# Patient Record
Sex: Female | Born: 1960 | Race: White | Hispanic: No | Marital: Married | Smoking: Never smoker
Health system: Southern US, Community
[De-identification: ages and names within clinical notes are randomized; demographics above are authoritative.]

## PROBLEM LIST (undated history)

## (undated) DIAGNOSIS — I10 Essential (primary) hypertension: Secondary | ICD-10-CM

## (undated) DIAGNOSIS — E781 Pure hyperglyceridemia: Secondary | ICD-10-CM

## (undated) DIAGNOSIS — K5900 Constipation, unspecified: Secondary | ICD-10-CM

## (undated) DIAGNOSIS — F419 Anxiety disorder, unspecified: Secondary | ICD-10-CM

## (undated) DIAGNOSIS — G35D Multiple sclerosis, unspecified: Secondary | ICD-10-CM

## (undated) DIAGNOSIS — R7303 Prediabetes: Secondary | ICD-10-CM

## (undated) DIAGNOSIS — R112 Nausea with vomiting, unspecified: Secondary | ICD-10-CM

## (undated) DIAGNOSIS — G2581 Restless legs syndrome: Secondary | ICD-10-CM

## (undated) DIAGNOSIS — G709 Myoneural disorder, unspecified: Secondary | ICD-10-CM

## (undated) DIAGNOSIS — T4145XA Adverse effect of unspecified anesthetic, initial encounter: Secondary | ICD-10-CM

## (undated) DIAGNOSIS — D649 Anemia, unspecified: Secondary | ICD-10-CM

## (undated) DIAGNOSIS — G4730 Sleep apnea, unspecified: Secondary | ICD-10-CM

## (undated) DIAGNOSIS — Z973 Presence of spectacles and contact lenses: Secondary | ICD-10-CM

## (undated) DIAGNOSIS — K219 Gastro-esophageal reflux disease without esophagitis: Secondary | ICD-10-CM

## (undated) DIAGNOSIS — Z9889 Other specified postprocedural states: Secondary | ICD-10-CM

## (undated) DIAGNOSIS — T8859XA Other complications of anesthesia, initial encounter: Secondary | ICD-10-CM

## (undated) DIAGNOSIS — M1990 Unspecified osteoarthritis, unspecified site: Secondary | ICD-10-CM

## (undated) DIAGNOSIS — F329 Major depressive disorder, single episode, unspecified: Secondary | ICD-10-CM

## (undated) DIAGNOSIS — F32A Depression, unspecified: Secondary | ICD-10-CM

## (undated) DIAGNOSIS — G35 Multiple sclerosis: Secondary | ICD-10-CM

## (undated) DIAGNOSIS — R51 Headache: Secondary | ICD-10-CM

## (undated) HISTORY — PX: TENDON RELEASE: SHX230

## (undated) HISTORY — PX: SHOULDER ARTHROSCOPY: SHX128

## (undated) HISTORY — PX: MEDIAL PARTIAL KNEE REPLACEMENT: SHX5965

## (undated) HISTORY — PX: OTHER SURGICAL HISTORY: SHX169

## (undated) HISTORY — PX: KNEE ARTHROSCOPY: SUR90

## (undated) HISTORY — PX: CARPAL TUNNEL RELEASE: SHX101

## (undated) HISTORY — PX: BLADDER SUSPENSION: SHX72

---

## 2006-07-07 HISTORY — PX: TOTAL KNEE ARTHROPLASTY: SHX125

## 2007-03-30 ENCOUNTER — Inpatient Hospital Stay (HOSPITAL_COMMUNITY): Admission: RE | Admit: 2007-03-30 | Discharge: 2007-03-31 | Payer: Self-pay | Admitting: Orthopedic Surgery

## 2008-07-07 HISTORY — PX: ENDOMETRIAL ABLATION: SHX621

## 2010-07-02 ENCOUNTER — Encounter
Admission: RE | Admit: 2010-07-02 | Discharge: 2010-07-02 | Payer: Self-pay | Source: Home / Self Care | Attending: Specialist | Admitting: Specialist

## 2010-07-07 HISTORY — PX: JOINT REPLACEMENT: SHX530

## 2010-07-17 ENCOUNTER — Encounter
Admission: RE | Admit: 2010-07-17 | Discharge: 2010-07-17 | Payer: Self-pay | Source: Home / Self Care | Attending: Orthopedic Surgery | Admitting: Orthopedic Surgery

## 2010-11-19 NOTE — Op Note (Signed)
NAMELATESA, FRATTO              ACCOUNT NO.:  [id number]   MEDICAL RECORD NO.:  [id number]          PATIENT TYPE:  INP   LOCATION:  X008                         FACILITY:  [facility]   PHYSICIAN:  Madlyn Frankel. Charlann Boxer, M.D.  DATE OF BIRTH:  March 29, 1961   DATE OF PROCEDURE:  03/30/2007  DATE OF DISCHARGE:                               OPERATIVE REPORT   PREOPERATIVE DIAGNOSIS:  Right knee medial compartment osteoarthritis.   POSTOPERATIVE DIAGNOSIS:  Right knee medial compartment osteoarthritis.   PROCEDURE:  Right knee partial knee replacement.   COMPONENTS USED:  Biomet Oxford knee system size medium femur, right  medial size C tibial tray and a size 3 insert.   SURGEON:  Madlyn Frankel. Charlann Boxer, M.D.   ASSISTANT:  Dwyane Luo, PA-C   ANESTHESIA:  Duramorph spinal.   COMPLICATIONS:  None.   BLOOD LOSS:  Minimal 50 mL.   DRAINS:  None.   INDICATIONS FOR PROCEDURE:  Joan Ray is a 50 year old female who  presented office on referral for evaluation for partial knee replacement  after failing conservative measures of knee arthroscopy, injections and  viscus augmentation.  She had persistent isolated medial knee pain per  her complaints and examination findings.  Radiographs indicate preserved  medial and anterior cruciate ligaments.  After reviewing her options  available at 50 years of age including partial versus total knee  replacement, risks and benefits of both, consent was obtained for  partial knee replacement.   PROCEDURE IN DETAIL:  The patient was brought to operative theater.  Once adequate anesthesia preoperative antibiotics administered,  clindamycin, the patient was positioned supine.  Proximal thigh  tourniquet was placed the right leg was then flexed and abducted onto  the Oxford leg holder, allowing for flexion to 120 degrees.  Right lower  extremity pre scrubbed and then prepped and draped using a soap  solution.   The leg was exsanguinated, tourniquet elevated.   The  midline incision was made from the patella to the tubercle.  Sharp  dissection was carried down the extensor mechanism.  Modified medial  patellar arthrotomy was created.  Following initial exposure and  debridement of some the anterior fat pad in the anterior meniscus small  exposure medially was carried out.  Initial attention was directed to  the tibial cut.   With an extramedullary guide positioned, I resected this point portion  of the proximal medial tibia.  We trialed with a size B tibial tray  initially and felt that at first the 4 passed pretty easily but the 3  was better.  Went ahead and chose this as my option for the tibial cut.  A starting awl was then placed into the femur 1 cm medial to the notch  of the femur.  Intramedullary rod was then passed as the guide.   At this point with the tibial tray in place and the 3 shim, the  posterior femoral cutting block was placed.  Holes were drilled for  this.  The posterior cutting block was then placed and the posterior cut  made.   Following this the zero spigot was  placed and the proximal and distal  femur milled.  After removing excessive bone trial reduction was carried  out and felt that in flexion the 86felt better than the 4 and with the  knee at 20 degrees of flexion I was unable to even a 1 spacer in.  Based  on experience this point I chose to use a 4 spigot. I went ahead and  milled the distal femur, removed excessive bone, trial reduction again  carried out with this medium femur and the size 3 insert.  The knee came  out to full extension.  There was a bit of play in the poly insert,  indicating that the knee was not excessively tight.  At this point all  the trials were removed.  Attention was now directed tibia.  After I  reevaluated tibia, I felt that the size C tray fit best and was held  into position against the cut surface of the proximal tibia, pinned in  position.  This allowed me use a reciprocating saw to  create a trough.  This was debrided out and trial reduction carried out with a size C  tibia for the right medial tibia, size medium femur.  The size three  trial insert that was lipped was inserted into the knee.  At 90 degrees  of flexion there was a couple mm play and the same at 20 degrees.  I  then determined I needed to remove some of this bone about 5 mm anterior  to the tip of the prosthesis.  This prevented impingement.  The knee  came out to full extension.  The knee did come out to full extension  without complication features.   At this point all trials were removed.  I used the pinpoint drill to  drill holes through sclerotic bone.   Following this I irrigated the knee out, suctioned it dry.  Cement was  mixed in two batches.  After the first mixture, this tibia was then  placed, a trial femur placed in the 3 feeler gauge placed with the knee  in flexion in 45 degrees, approximately 8-9 minutes this was removed.  Excessive cement removed as much could be visualized.  The same  procedure happened for the femoral component.   Following the cement technique, I removed as much visualized cement as I  could see, irrigated knee out again.  Based on the trials, I chose a  size 3 insert.  The size 3 insert was then snapped into position without  difficulty or complication.   At this point the knee was reirrigated.  I injected the capsular and  retinacular tissues with 30 mL of 0.25% Marcaine and 1 mL Toradol.  I  then reapproximated extensor mechanism using #1 Vicryl, 2-0 Vicryl in  the subcu tissue and a running 4-0 Monocryl.  The knee was cleaned,  dried, dressed sterilely with Steri-Strips and sterile bulky dressing.  She was brought recovery room in stable condition.      Madlyn Frankel Charlann Boxer, M.D.  Electronically Signed     MDO/MEDQ  D:  03/30/2007  T:  03/31/2007  Job:  [id number]

## 2010-11-22 NOTE — H&P (Signed)
Joan Ray, Joan Ray              ACCOUNT NO.:  [id number]   MEDICAL RECORD NO.:  [id number]         PATIENT TYPE:  LINP   LOCATION:                               FACILITY:  [facility]   PHYSICIAN:  Madlyn Frankel. Charlann Boxer, M.D.  DATE OF BIRTH:  Aug 16, 1960   DATE OF ADMISSION:  03/30/2007  DATE OF DISCHARGE:                              HISTORY & PHYSICAL   ATTENDING PHYSICIAN:  Durene Romans, M.D.   PROCEDURE TO BE PERFORMED:  The procedure will be a right partial knee  replacement.   CHIEF COMPLAINT:  Right knee pain.   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  An MRI scan has  been done in the past and has shown no meniscal pathology.  Her knee has  been refractory to all conservative treatment including anti-  inflammatories, cortisone injections and viscus supplementation.  She  also has experienced a long-standing history of left plantar fasciitis  which she is being treated for concomitantly.  She has been  presurgically assessed for surgery by York Ram, P.A. and Dr.  Janene Harvey.   PAST MEDICAL HISTORY:  The past medical history is significant for:  1. Osteoarthritis.  2. Left heel pain, plantar fasciitis.  3. Reflux disease.   PAST SURGICAL HISTORY:  The past surgical history includes a left knee  lateral release in 07/07, right knee arthroscopic debridement.   FAMILY HISTORY:  The family history is significant for heart disease,  hypertension, stroke, and arthritis.   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  She is married.  The primary caregiver after surgery will be her husband  and her daughter.   ALLERGIES:  Drug allergies include codeine, Betadine, EDTA, hydrocodone,  Tylox, Keflex, Cefzil, erythromycin,  Darvocet, and Valium.   MEDICATIONS:  Medications include Nexium 40 mg p.o. q.d., Effexor 75 mg  p.o. q.d., Zyrtec 10 mg p.o. q.d., Ambien-CR 12.5 mg one p.o. q.h.s.,  Celebrex 200 mg p.o. q.d., Norvasc 5 mg p.o. q.d., Klonopin 1 mg p.o.  q.d., and Demerol 50 mg p.r.n.   REVIEW OF SYSTEMS:  MUSCULOSKELETAL:  Long-standing history of left heel  plantar fasciitis for which she will need additional physical therapy at  the time of surgery as well otherwise see HPI.   PHYSICAL EXAMINATION:  VITAL SIGNS:  Pulse 69, respirations 18, and  blood pressure 118/80.  GENERAL:  Awake, alert and oriented, well-developed, well-nourished, no  acute distress.  NECK:  Supple.  No carotid bruits.  CHEST/LUNGS:  Clear to auscultation bilaterally.  BREASTS:  Deferred.  HEART:  Regular rate and rhythm without murmurs.  ABDOMEN:  Soft, nontender, bowel sounds present.  GENITOURINARY:  Deferred.  EXTREMITIES:  Right knee has medial sided tenderness.  She does have  mild varus.  Her range of motion is 3 to 130.  SKIN:  No cellulitis.  NEUROLOGIC:  Intact distal  sensibilities.   INVESTIGATIONS:  Laboratory studies, EKG, and chest x-ray pending, to be  faxed to office, and also presurgical testing.   IMPRESSION:  1. Right knee medial osteoarthritis.  2. Reflux disease.  3. Migraines.  4. Menopause.   PLAN OF ACTION:  Right partial knee replacement by surgeon Dr. Durene Romans. The risks and complications were discussed.  Questions were  encouraged, answered, reviewed.   During her course of stay she will need additional physical therapy for  plantar fasciitis, especially during her home health care physical  therapy.  I had a previous conversation with home health worker who  stated that provided she is covered they would be able to work with the  patient for this plantar fasciitis as it will be necessary due to her  gait retraining.     ______________________________  Joan Ray,  Joan Ray      Madlyn Frankel. Charlann Boxer, M.D.  Electronically Signed    BLM/MEDQ  D:  03/15/2007  T:  03/16/2007  Job:  [id number]   cc:   Joan Ray, Joan Ray   Madlyn Frankel. Charlann Boxer, M.D.  Fax: [phone]

## 2010-12-03 ENCOUNTER — Other Ambulatory Visit: Payer: Self-pay | Admitting: Orthopedic Surgery

## 2010-12-03 ENCOUNTER — Encounter (HOSPITAL_COMMUNITY): Payer: BC Managed Care – PPO

## 2010-12-03 LAB — DIFFERENTIAL
Basophils Absolute: 0.1 10*3/uL (ref 0.0–0.1)
Basophils Relative: 1 % (ref 0–1)
Eosinophils Absolute: 0.3 10*3/uL (ref 0.0–0.7)
Eosinophils Relative: 3 % (ref 0–5)
Lymphocytes Relative: 43 % (ref 12–46)
Lymphs Abs: 3.7 10*3/uL (ref 0.7–4.0)
Monocytes Absolute: 0.6 10*3/uL (ref 0.1–1.0)
Monocytes Relative: 6 % (ref 3–12)
Neutro Abs: 4 10*3/uL (ref 1.7–7.7)
Neutrophils Relative %: 47 % (ref 43–77)

## 2010-12-03 LAB — CBC
HCT: 40.5 % (ref 36.0–46.0)
Hemoglobin: 13.4 g/dL (ref 12.0–15.0)
MCH: 29.1 pg (ref 26.0–34.0)
MCHC: 33.1 g/dL (ref 30.0–36.0)
MCV: 88 fL (ref 78.0–100.0)
Platelets: 279 10*3/uL (ref 150–400)
RBC: 4.6 MIL/uL (ref 3.87–5.11)
RDW: 14.6 % (ref 11.5–15.5)
WBC: 8.6 10*3/uL (ref 4.0–10.5)

## 2010-12-03 LAB — URINALYSIS, ROUTINE W REFLEX MICROSCOPIC
Bilirubin Urine: NEGATIVE
Hgb urine dipstick: NEGATIVE
Ketones, ur: NEGATIVE mg/dL
Protein, ur: NEGATIVE mg/dL
Urobilinogen, UA: 0.2 mg/dL (ref 0.0–1.0)

## 2010-12-03 LAB — BASIC METABOLIC PANEL
Calcium: 9.1 mg/dL (ref 8.4–10.5)
Chloride: 96 mEq/L (ref 96–112)
Creatinine, Ser: 0.56 mg/dL (ref 0.4–1.2)
GFR calc Af Amer: >60 mL/min (ref >60)
Glucose, Bld: 104 mg/dL — ABNORMAL HIGH (ref 70–99)

## 2010-12-03 LAB — PROTIME-INR
INR: 0.91 (ref 0.00–1.49)
Prothrombin Time: 12.5 seconds (ref 11.6–15.2)

## 2010-12-03 LAB — SURGICAL PCR SCREEN: Staphylococcus aureus: NEGATIVE

## 2010-12-03 LAB — APTT: aPTT: 28 seconds (ref 24–37)

## 2010-12-09 ENCOUNTER — Inpatient Hospital Stay (HOSPITAL_COMMUNITY)
Admission: RE | Admit: 2010-12-09 | Discharge: 2010-12-12 | DRG: 209 | Disposition: A | Discharge status: Home Health | Payer: BC Managed Care – PPO | Source: Ambulatory Visit | Attending: Orthopedic Surgery | Admitting: Orthopedic Surgery

## 2010-12-09 DIAGNOSIS — M1710 Unilateral primary osteoarthritis, unspecified knee: Principal | ICD-10-CM | POA: Diagnosis present

## 2010-12-09 DIAGNOSIS — Z96659 Presence of unspecified artificial knee joint: Secondary | ICD-10-CM

## 2010-12-09 DIAGNOSIS — I10 Essential (primary) hypertension: Secondary | ICD-10-CM | POA: Diagnosis present

## 2010-12-09 DIAGNOSIS — Z79899 Other long term (current) drug therapy: Secondary | ICD-10-CM

## 2010-12-09 DIAGNOSIS — F341 Dysthymic disorder: Secondary | ICD-10-CM | POA: Diagnosis present

## 2010-12-09 DIAGNOSIS — K219 Gastro-esophageal reflux disease without esophagitis: Secondary | ICD-10-CM | POA: Diagnosis present

## 2010-12-09 DIAGNOSIS — Z881 Allergy status to other antibiotic agents status: Secondary | ICD-10-CM

## 2010-12-09 DIAGNOSIS — N390 Urinary tract infection, site not specified: Secondary | ICD-10-CM | POA: Diagnosis not present

## 2010-12-09 DIAGNOSIS — G35 Multiple sclerosis: Secondary | ICD-10-CM | POA: Diagnosis present

## 2010-12-09 DIAGNOSIS — Z885 Allergy status to narcotic agent status: Secondary | ICD-10-CM

## 2010-12-09 DIAGNOSIS — J383 Other diseases of vocal cords: Secondary | ICD-10-CM | POA: Diagnosis present

## 2010-12-09 DIAGNOSIS — G8929 Other chronic pain: Secondary | ICD-10-CM | POA: Diagnosis present

## 2010-12-09 LAB — TYPE AND SCREEN: Antibody Screen: NEGATIVE

## 2010-12-10 LAB — BASIC METABOLIC PANEL
BUN: 9 mg/dL (ref 6–23)
Calcium: 8.1 mg/dL — ABNORMAL LOW (ref 8.4–10.5)
Chloride: 100 mEq/L (ref 96–112)
Creatinine, Ser: 0.48 mg/dL (ref 0.4–1.2)
Sodium: 138 mEq/L (ref 135–145)

## 2010-12-10 LAB — CBC
Hemoglobin: 10.7 g/dL — ABNORMAL LOW (ref 12.0–15.0)
MCH: 29.8 pg (ref 26.0–34.0)
RDW: 15 % (ref 11.5–15.5)
WBC: 9.1 10*3/uL (ref 4.0–10.5)

## 2010-12-11 LAB — BASIC METABOLIC PANEL
BUN: 5 mg/dL — ABNORMAL LOW (ref 6–23)
CO2: 29 mEq/L (ref 19–32)
Creatinine, Ser: 0.56 mg/dL (ref 0.4–1.2)
GFR calc non Af Amer: >60 mL/min (ref >60)
Potassium: 2.8 mEq/L — ABNORMAL LOW (ref 3.5–5.1)
Sodium: 136 mEq/L (ref 135–145)

## 2010-12-11 LAB — CBC
Hemoglobin: 11.5 g/dL — ABNORMAL LOW (ref 12.0–15.0)
MCV: 89.3 fL (ref 78.0–100.0)
RDW: 14.9 % (ref 11.5–15.5)

## 2010-12-12 LAB — URINALYSIS, ROUTINE W REFLEX MICROSCOPIC
Bilirubin Urine: NEGATIVE
Specific Gravity, Urine: 1.019 (ref 1.005–1.030)

## 2010-12-12 LAB — URINE MICROSCOPIC-ADD ON

## 2010-12-14 LAB — URINE CULTURE
Colony Count: >=100000
Culture  Setup Time: 201206071418

## 2010-12-16 NOTE — Op Note (Signed)
NAMEFLORICE, Ray NO.:  [id number]  MEDICAL RECORD NO.:  [id number]  LOCATION:  0008                         FACILITY:  [facility]  PHYSICIAN:  Madlyn Frankel. Charlann Boxer, M.D.  DATE OF BIRTH:  [birth year]  DATE OF PROCEDURE:  12/09/2010 DATE OF DISCHARGE:                              OPERATIVE REPORT   PREOPERATIVE DIAGNOSIS:  Left knee osteoarthritis.  POSTOPERATIVE DIAGNOSIS:  Left knee osteoarthritis.  PROCEDURE:  Left total-knee replacement.  COMPONENTS USED:  DePuy rotating platform posterior stabilized knee system with size 4 narrow femur, 3 tibia, 10-mm insert and a 41 patellar button.  SURGEON:  Madlyn Frankel. Charlann Boxer, M.D.  ASSISTANT:  Jaquelyn Bitter. Chabon, PA-C.  ANESTHESIA:  Preoperative regional femoral nerve block plus general anesthetic.  SPECIMENS:  None.  COMPLICATIONS:  None.  DRAINS:  One Hemovac.  TOURNIQUET TIME:  Tourniquet time was 37 minutes at 250 mmHg.  INDICATIONS FOR PROCEDURE:  Ms. Joan Ray is a 50 year old female who has been followed in the office for bilateral knee pain.  She has had a successful right partial knee replacement, but has presented recently with left knee issues.  She had failed conservative measures including arthroscopic debridement with persistent and progressive degenerative changes as well as some edematous changes of the bone.  After unsuccessfully trying to manage her left knee pain, she wished to proceed with more definitive measures.  Based on arthroscopic findings of some degenerative changes within the patellofemoral compartment, I suggested that she should perhaps entertain a total-knee replacement versus the partial in this left knee.  The risks and benefits and pros and cons of the procedure were discussed in the setting of her previous surgery.  Consent was obtained for the benefit of pain relief.  PROCEDURE IN DETAIL:  The patient was brought to the operative theater. Once adequate anesthesia, preoperative  antibiotics, and clindamycin administered, she was positioned supine with a left thigh tourniquet placed.  The left lower extremity was then prepped and draped in the sterile fashion.  A time-out was performed identifying the patient, planned procedure and extremity.  Her left foot was placed in the [facility] leg holder.  The leg was exsanguinated and the tourniquet elevated to 250 mmHg.  A midline incision was made followed by a median parapatellar arthrotomy following initial exposure and debridement.  Attention was first directed to the patella.  Precut measurement was approximately 24 mm.  I resected down to about 14 mm and used the 41 patellar button to cover the cut surface.  Lug holes were drilled and a metal shim placed to protect the patella from retractors and saw blades.  Attention was now directed to the femur.  The femoral canal was opened with a drill and irrigated to try to prevent fat emboli.  An intramedullary rod was passed in at 5 degrees of valgus and 10 mm of bone was resected off the distal femur.  Following this resection, the tibia was subluxated anteriorly and using an extramedullary guide, a 10- mm resection was made based off the proximal lateral tibia.  We then confirmed that the extension gap was stable medially and laterally with a 10-mm block in place.  I then sized the femur  to be a size 4.  I felt that medially and laterally it was going to be best fit with a narrow component.  After confirming that the tibial cut was perpendicular in the coronal plane, I set rotation of the size 4 rotation block based on the proximal tibia that was anterior in reference.  The 4-in-1 cutting block was placed.  The anterior, posterior and chamfer cuts were then made without difficulty nor notching.  The final box cut was made off the lateral aspect of the distal femur.  The tibia was then subluxated anteriorly and the cut surface seemed to be best fit with a size 3  tibial tray.  It was pinned to the medial third of the tubercle, drilled and keel punched.  A trial reduction was carried out with the 4 narrow femur, 3 tibia, and a 10-mm insert.  The knee came to full extension and was stable from extension to flexion. The patella tracked through the trochlea without the application of pressure.  Given these findings, the trial components were removed.  The final components were opened and the knee was irrigated with normal saline solution with pulse lavage and 30 cc of 0.25% Marcaine was injected into the synovial capsule junction of the knee.  The final components were then cemented onto the clean and dried cut surfaces of bone.  The knee was brought to extension with a 10-mm insert and extruded cement was removed.  Once the cement had fully cured and excessive cement was removed throughout the knee, the final 10-mm insert was chosen and placed in the knee.  The knee was re-irrigated.  The tourniquet had been let down at 37 minutes.  No significant hemostasis was required.  A medium Hemovac drain was placed deep.  The extensor mechanism was then reapproximated using #1 Vicryl with the knee in flexion.  The remainder of the wound was closed with 2-0 Vicryl and running 4-0 Monocryl.  The knee was cleaned, dried and dressed sterilely with Dermabond and an Aquacel dressing.  The drain site was dressed separately and the knee was wrapped in an Ace wrap.  She was brought to the recovery room, extubated, stable, tolerating the procedure well.     Madlyn Frankel Charlann Boxer, M.D.     MDO/MEDQ  D:  12/09/2010  T:  12/09/2010  Job:  [id number]  Electronically Signed by Durene Romans M.D. on 12/16/2010 09:21:05 AM

## 2010-12-16 NOTE — Discharge Summary (Signed)
NAMEXOCHILTH, STANDISH NO.:  [id number]  MEDICAL RECORD NO.:  [id number]  LOCATION:  1612                         FACILITY:  [facility]  PHYSICIAN:  Madlyn Frankel. Charlann Boxer, M.D.  DATE OF BIRTH:  26-May-1961  DATE OF ADMISSION:  12/09/2010 DATE OF DISCHARGE:  12/12/2010                              DISCHARGE SUMMARY   ADMITTING DIAGNOSIS:  Left knee osteoarthritis.  DISCHARGE DIAGNOSES: 1. Left knee osteoarthritis, status post left total knee replacement. 2. Urinary tract infection 3. Anxiety, depression. 4. Hypertension. 5. Multiple sclerosis. 6. Reflux disease. 7. Chronic pain issues. 8. History of right partial knee replacement.  ADMITTING HISTORY:  Ms. Joan Ray is a pleasant 50 year old female who I have been following in the office.  She has had a successful right partial knee replacement.  She has had recent-onset left knee pain with a history of knee arthroscopy with progression of her condition.  She had persistent recurring left knee discomfort with some findings radiographically and arthroscopically the patellofemoral changes.  Given the persistence of her problems, a repeat evaluation by MRI and the findings above led to the discussion of more definitive measures, she would like at this point to proceed with more definitive measures, total knee replacement versus partial knee wounds were discussed.  Consent was obtained.  HOSPITAL COURSE:  The patient was admitted for same-day surgery on December 09, 2010, who underwent a left total knee replacement.  Postoperatively, after routine stay in the recovery room, she was transferred to the orthopedic ward where she remained for a 3-day hospital stay.  Ms. Joan Ray had multiple drug allergies, sensitivities, which made it difficult to manage her discomfort as well without other issues.  Her nausea and vomiting were significant for that time, in fact the day of the first surgery.  She was, however, placed on Benadryl  for itching, placed on Dilaudid for pain.  We had kept her Hemovac drain until postop day #2 to help pull out some excessive postoperative bleeding, she was placed on aspirin postop.  Her hematocrit on postop day #1 was 32.2.  By postop day #2, she was stabilized.  On postop day #3, she was at this point ready for discharge.  She did report some frequency and urgency with urination.  Urinalysis was ordered and found to be positive for UA, she was started on ciprofloxacin 500 mg p.o. b.i.d. for a total of 4 days.  DISCHARGE INSTRUCTIONS:  She will be discharged home with home health physical therapy to work on range of motion, strengthening, gait training.  She will return to see Dr. Durene Romans at [facility] at [phone] in 2 weeks.  Any questions can be addressed to office.  She may shower and put an Aquacel dressing, which can be removed on Tuesday or Wednesday the following week.  She will then cover with gauze and tape.  Her discharge medications will include all of her home medications including, 1. Ciprofloxacin 500 mg p.o. b.i.d. for urinary tract infection. 2. Benadryl 50 mg q.6 h. as needed for itching. 3. Colace 100 mg p.o. b.i.d. for constipation while on pain medicine. 4. Aspirin 325 mg b.i.d. while on pain medicine. 5. Dilaudid 4 mg tablets  1-2 tablets every 4-6 as needed for pain. 6. Robaxin 500 mg every 6 hours as needed for muscle spasm pain. 7. Ambien CR 12.5 mg nightly. 8. Bisacodyl 5 mg daily as needed. 9. Celebrex 200 mg b.i.d. as needed. 10.Cymbalta 60 mg 2 capsules daily. 11.Flexeril 10 mg t.i.d. as needed, not to be used at the same time     with Robaxin. 12.Hydrochlorothiazide 25 mg daily. 13.Klonopin 1 mg t.i.d. as needed. 14.Nexium 40 mg daily. 15.Norvasc 10 mg daily. 16.Phenergan 25 mg 1/4 to 1/2 tablet every 6 hours as needed for     nausea. 17.Relpax 40 mg daily. 18.Robaxin 500 mg. 19.Tysabri IV medications for multiple sclerosis  taking monthly at 300     mg. 20.Zyrtec 10 mg daily as needed.     Madlyn Frankel Charlann Boxer, M.D.     MDO/MEDQ  D:  12/12/2010  T:  12/13/2010  Job:  [id number]  Electronically Signed by Durene Romans M.D. on 12/16/2010 09:21:09 AM

## 2010-12-16 NOTE — H&P (Signed)
NAME:  Joan Ray, Joan Ray              ACCOUNT NO.:  [id number]  MEDICAL RECORD NO.:                     PATIENT TYPE:  LOCATION:                                 FACILITY:  PHYSICIAN:  Madlyn Frankel. Charlann Boxer, M.D.  DATE OF BIRTH:  18-Oct-1960  DATE OF ADMISSION: DATE OF DISCHARGE:                             HISTORY & PHYSICAL   DATE OF SURGERY:  December 09, 2010.  ADMITTING DIAGNOSIS:  Osteoarthritis, left knee.  HISTORY OF PRESENT ILLNESS:  This is a 50 year old lady with a history of osteoarthritis of the left knee with failure of conservative treatment to alleviate her symptoms.  After discussion of treatment, benefits, risks, and options, the patient now scheduled for total knee arthroplasty of her left knee.  Note, she is not a candidate for transatlantic acid due to multiple allergies.  She is given her home medications of aspirin, Robaxin, Colace, MiraLax, and iron.  PAST MEDICAL HISTORY:  Drug allergies to all MYCIN ANTIBIOTICS, CEPHALOSPORINS, DOXYCYCLINE, BETADINE, CODEINE, TYLOX, VICODIN, OXYCODONE, VALIUM, TOPAMAX, LAMICTAL, EDTA PRESERVATIVE, also allergic to COCONUT.  CURRENT MEDICATIONS:  Include: 1. Cymbalta 60 mg two daily. 2. Zyrtec 10 mg one daily. 3. Celebrex 200 mg b.i.d. 4. Norvasc 10 mg one daily.  She cannot take the generic as it causes     migraines. 5. Hydrochlorothiazide 25 mg daily. 6. Nasonex p.r.n. 7. Relpax 40 mg p.r.n. 8. Tysabri IV infusion one times a month for her MS. 9. Nexium 40 mg at bedtime.  She needs to take the actual Nexium, no     substitution. 10.Ambien CR 12.5 mg at bedtime. 11.Clonazepam 1 mg at bedtime. 12.Robaxin 500 mg 2 tablets t.i.d. p.r.n. 13.Flexeril 10 mg t.i.d. p.r.n.  PREVIOUS SURGERIES:  Include left lateral ligament release of the knee, right medial meniscectomy, right partial knee replacement, vaginal ablation of Mini-Sling, right and left knee arthroscopies.  FAMILY HISTORY:  Positive for cancer.  SOCIAL HISTORY:   The patient is married.  She works as an Public house manager.  She does not smoke and drinks rarely.  REVIEW OF SYSTEMS:  CENTRAL NERVOUS SYSTEM:  Positive for MS, migraines, impaired vision, anxiety, and depression.  PULMONARY:  Negative shortness of breath, PND, and orthopnea.  Positive for history of asthma, but not currently.  CARDIOVASCULAR:  Negative for chest pain or palpitation.  GI:  Negative for ulcers or hepatitis.  Positive for hiatal hernia and irritable bowel syndrome.  GU:  Positive for urinary incontinence.  MUSCULOSKELETAL:  Positive as in HPI.  PHYSICAL EXAM:  VITAL SIGNS:  BP 110/80, respirations 16, pulse 72 and regular. GENERAL APPEARANCE:  This is a well-developed, well-nourished lady, in no acute distress. HEENT:  Head normocephalic.  Nose patent.  Pupils equal, round, reactive to light.  Throat without injection. NECK:  Supple without adenopathy.  Carotids are 2+ without bruit. CHEST:  Clear to auscultation.  No rales or rhonchi.  Respirations 16. HEART:  Regular rate and rhythm at 72 beats per minute without murmur. ABDOMEN:  Soft with active bowel sounds.  No masses or organomegaly. NEUROLOGIC:  The patient is alert and  oriented to time, place, and person.  Cranial nerves II-XII grossly intact. EXTREMITIES:  Left knee, 0 to 120 degree range of motion, it is painful. She has crepitation and a 2+ effusion.  Neurovascular status intact.  IMPRESSION:  Left knee osteoarthritis.  PLAN:  Left total knee arthroplasty.     Jaquelyn Bitter. Chabon, P.A.   ______________________________ Madlyn Frankel Charlann Boxer, M.D.    SJC/MEDQ  D:  12/04/2010  T:  12/05/2010  Job:  [id number]  Electronically Signed by Jodene Nam P.A. on 12/10/2010 07:28:29 AM Electronically Signed by Durene Romans M.D. on 12/16/2010 09:21:00 AM

## 2011-03-03 ENCOUNTER — Other Ambulatory Visit (HOSPITAL_BASED_OUTPATIENT_CLINIC_OR_DEPARTMENT_OTHER): Payer: Self-pay | Admitting: Rheumatology

## 2011-03-03 DIAGNOSIS — M461 Sacroiliitis, not elsewhere classified: Secondary | ICD-10-CM

## 2011-03-08 ENCOUNTER — Ambulatory Visit (HOSPITAL_BASED_OUTPATIENT_CLINIC_OR_DEPARTMENT_OTHER)
Admission: RE | Admit: 2011-03-08 | Discharge: 2011-03-08 | Disposition: A | Discharge status: Home / Self Care | Payer: BC Managed Care – PPO | Source: Ambulatory Visit | Attending: Rheumatology | Admitting: Rheumatology

## 2011-03-08 DIAGNOSIS — M533 Sacrococcygeal disorders, not elsewhere classified: Secondary | ICD-10-CM

## 2011-03-08 DIAGNOSIS — M169 Osteoarthritis of hip, unspecified: Secondary | ICD-10-CM | POA: Insufficient documentation

## 2011-03-08 DIAGNOSIS — M25559 Pain in unspecified hip: Secondary | ICD-10-CM | POA: Insufficient documentation

## 2011-03-08 DIAGNOSIS — M461 Sacroiliitis, not elsewhere classified: Secondary | ICD-10-CM

## 2011-03-08 DIAGNOSIS — M1610 Unilateral primary osteoarthritis, unspecified hip: Secondary | ICD-10-CM | POA: Insufficient documentation

## 2011-04-17 LAB — APTT: aPTT: 27

## 2011-04-17 LAB — COMPREHENSIVE METABOLIC PANEL
Alkaline Phosphatase: 74
BUN: 10
Calcium: 9.2
Creatinine, Ser: 0.7
GFR calc non Af Amer: >60
Glucose, Bld: 93
Potassium: 4.3
Sodium: 142

## 2011-04-17 LAB — PREGNANCY, URINE: Preg Test, Ur: NEGATIVE

## 2011-04-17 LAB — CBC
MCHC: 34
MCV: 79.7
MCV: 80.3
Platelets: 248
RDW: 17.7 — ABNORMAL HIGH

## 2011-04-17 LAB — URINALYSIS, ROUTINE W REFLEX MICROSCOPIC
Bilirubin Urine: NEGATIVE
Glucose, UA: NEGATIVE

## 2011-04-17 LAB — BASIC METABOLIC PANEL
BUN: 5 — ABNORMAL LOW
CO2: 29
Calcium: 8.4
Chloride: 108
Creatinine, Ser: 0.65
GFR calc Af Amer: >60

## 2011-04-17 LAB — ABO/RH: ABO/RH(D): O POS

## 2011-04-17 LAB — TYPE AND SCREEN: ABO/RH(D): O POS

## 2012-01-05 DIAGNOSIS — H47299 Other optic atrophy, unspecified eye: Secondary | ICD-10-CM | POA: Insufficient documentation

## 2012-03-30 DIAGNOSIS — R0789 Other chest pain: Secondary | ICD-10-CM | POA: Insufficient documentation

## 2012-03-30 DIAGNOSIS — E785 Hyperlipidemia, unspecified: Secondary | ICD-10-CM | POA: Insufficient documentation

## 2012-06-11 DIAGNOSIS — K219 Gastro-esophageal reflux disease without esophagitis: Secondary | ICD-10-CM | POA: Insufficient documentation

## 2012-06-11 DIAGNOSIS — M1990 Unspecified osteoarthritis, unspecified site: Secondary | ICD-10-CM | POA: Insufficient documentation

## 2012-06-13 DIAGNOSIS — R600 Localized edema: Secondary | ICD-10-CM | POA: Insufficient documentation

## 2012-06-13 DIAGNOSIS — IMO0002 Reserved for concepts with insufficient information to code with codable children: Secondary | ICD-10-CM | POA: Insufficient documentation

## 2012-06-13 DIAGNOSIS — R7309 Other abnormal glucose: Secondary | ICD-10-CM | POA: Insufficient documentation

## 2012-09-19 DIAGNOSIS — R928 Other abnormal and inconclusive findings on diagnostic imaging of breast: Secondary | ICD-10-CM | POA: Insufficient documentation

## 2013-01-05 DIAGNOSIS — G5710 Meralgia paresthetica, unspecified lower limb: Secondary | ICD-10-CM | POA: Insufficient documentation

## 2013-01-05 DIAGNOSIS — M5414 Radiculopathy, thoracic region: Secondary | ICD-10-CM | POA: Insufficient documentation

## 2013-02-22 DIAGNOSIS — E882 Lipomatosis, not elsewhere classified: Secondary | ICD-10-CM | POA: Insufficient documentation

## 2013-04-26 ENCOUNTER — Encounter (HOSPITAL_COMMUNITY): Payer: Self-pay | Admitting: Pharmacy Technician

## 2013-04-26 NOTE — Pre-Procedure Instructions (Signed)
Oza Oberle  04/26/2013   Your procedure is scheduled on: Thursday, October 30 th   Report to [facility]  2 * 3 at  8:30 AM.             [facility] Parking)  Call this number if you have problems the morning of surgery: [phone]   Remember:   Do not eat food or drink liquids after midnight Wednesday.   Take these medicines the morning of surgery with A SIP OF WATER: Norvasc, Nexium, Klonopin, Cymbalta   Do not wear jewelry, make-up or nail polish.  Do not wear lotions, powders, or perfumes. You may wear deodorant.  Do not shave underarms & legs 48 hours prior to surgery.    Do not bring valuables to the hospital.  [facility] is not responsible for any belongings or valuables.               Contacts, dentures or bridgework may not be worn into surgery.  Leave suitcase in the car. After surgery it may be brought to your room.   For patients admitted to the hospital, discharge time is determined by your treatment team.   Name and phone number of your driver:    Special Instructions: Shower using CHG 2 nights before surgery and the night before surgery.  If you shower the day of surgery use CHG.  Use special wash - you have one bottle of CHG for all showers.  You should use approximately 1/3 of the bottle for each shower.   Please read over the following fact sheets that you were given: Pain Booklet, Blood Transfusion Information, MRSA Information and Surgical Site Infection Prevention

## 2013-04-27 ENCOUNTER — Encounter (HOSPITAL_COMMUNITY)
Admission: RE | Admit: 2013-04-27 | Discharge: 2013-04-27 | Disposition: A | Discharge status: Home / Self Care | Payer: BC Managed Care – PPO | Source: Ambulatory Visit | Attending: Orthopedic Surgery | Admitting: Orthopedic Surgery

## 2013-04-27 ENCOUNTER — Encounter (HOSPITAL_COMMUNITY): Payer: Self-pay

## 2013-04-27 DIAGNOSIS — Z01818 Encounter for other preprocedural examination: Secondary | ICD-10-CM | POA: Insufficient documentation

## 2013-04-27 DIAGNOSIS — Z01812 Encounter for preprocedural laboratory examination: Secondary | ICD-10-CM | POA: Insufficient documentation

## 2013-04-27 HISTORY — DX: Anxiety disorder, unspecified: F41.9

## 2013-04-27 HISTORY — DX: Unspecified osteoarthritis, unspecified site: M19.90

## 2013-04-27 HISTORY — DX: Other specified postprocedural states: Z98.890

## 2013-04-27 HISTORY — DX: Adverse effect of unspecified anesthetic, initial encounter: T41.45XA

## 2013-04-27 HISTORY — DX: Gastro-esophageal reflux disease without esophagitis: K21.9

## 2013-04-27 HISTORY — DX: Depression, unspecified: F32.A

## 2013-04-27 HISTORY — DX: Myoneural disorder, unspecified: G70.9

## 2013-04-27 HISTORY — DX: Anemia, unspecified: D64.9

## 2013-04-27 HISTORY — DX: Nausea with vomiting, unspecified: R11.2

## 2013-04-27 HISTORY — DX: Restless legs syndrome: G25.81

## 2013-04-27 HISTORY — DX: Other complications of anesthesia, initial encounter: T88.59XA

## 2013-04-27 HISTORY — DX: Headache: R51

## 2013-04-27 HISTORY — DX: Major depressive disorder, single episode, unspecified: F32.9

## 2013-04-27 LAB — COMPREHENSIVE METABOLIC PANEL
ALT: 15 U/L (ref 0–35)
Albumin: 4 g/dL (ref 3.5–5.2)
Alkaline Phosphatase: 64 U/L (ref 39–117)
Calcium: 9.4 mg/dL (ref 8.4–10.5)
GFR calc Af Amer: >90 mL/min (ref >90)
Glucose, Bld: 88 mg/dL (ref 70–99)
Potassium: 3.4 mEq/L — ABNORMAL LOW (ref 3.5–5.1)
Sodium: 141 mEq/L (ref 135–145)
Total Protein: 6.8 g/dL (ref 6.0–8.3)

## 2013-04-27 LAB — SURGICAL PCR SCREEN
MRSA, PCR: NEGATIVE
Staphylococcus aureus: NEGATIVE

## 2013-04-27 LAB — CBC
Hemoglobin: 13.1 g/dL (ref 12.0–15.0)
MCH: 29.3 pg (ref 26.0–34.0)
MCHC: 34 g/dL (ref 30.0–36.0)
Platelets: 237 10*3/uL (ref 150–400)
RDW: 14.7 % (ref 11.5–15.5)

## 2013-04-27 LAB — ABO/RH: ABO/RH(D): O POS

## 2013-04-27 LAB — TYPE AND SCREEN
ABO/RH(D): O POS
Antibody Screen: NEGATIVE

## 2013-04-27 NOTE — Progress Notes (Addendum)
Pt states she saw cardio for "ache in her chest which was intermittent" around 2011--no tests done then (her PCP) wanted her to get it checked....nothing came of it. She also had sleep study done 20 yr ago -- negative.  She takes Norvasc for migraines and has been diagnosed with MS. I requested EKG  From her PCP-in chart.  DA

## 2013-05-04 MED ORDER — CEFAZOLIN SODIUM-DEXTROSE 2-3 GM-% IV SOLR
2.0000 g | INTRAVENOUS | Status: AC
  Administered 2013-05-05: 2 g via INTRAVENOUS
  Filled 2013-05-04: qty 50

## 2013-05-04 MED ORDER — VANCOMYCIN HCL IN DEXTROSE 1-5 GM/200ML-% IV SOLN
1000.0000 mg | Freq: Two times a day (BID) | INTRAVENOUS | Status: DC
  Administered 2013-05-05: 1000 mg via INTRAVENOUS
  Filled 2013-05-04: qty 200

## 2013-05-05 ENCOUNTER — Inpatient Hospital Stay (HOSPITAL_COMMUNITY)
Admission: RE | Admit: 2013-05-05 | Discharge: 2013-05-07 | DRG: 460 | Disposition: A | Discharge status: Home / Self Care | Payer: BC Managed Care – PPO | Source: Ambulatory Visit | Attending: Orthopedic Surgery | Admitting: Orthopedic Surgery

## 2013-05-05 ENCOUNTER — Encounter (HOSPITAL_COMMUNITY)
Admission: RE | Disposition: A | Discharge status: Home / Self Care | Payer: Self-pay | Source: Ambulatory Visit | Attending: Orthopedic Surgery

## 2013-05-05 ENCOUNTER — Encounter (HOSPITAL_COMMUNITY): Payer: BC Managed Care – PPO | Admitting: Anesthesiology

## 2013-05-05 ENCOUNTER — Ambulatory Visit (HOSPITAL_COMMUNITY): Payer: BC Managed Care – PPO | Admitting: Anesthesiology

## 2013-05-05 ENCOUNTER — Ambulatory Visit (HOSPITAL_COMMUNITY): Payer: BC Managed Care – PPO

## 2013-05-05 ENCOUNTER — Encounter (HOSPITAL_COMMUNITY): Payer: Self-pay | Admitting: Certified Registered Nurse Anesthetist

## 2013-05-05 DIAGNOSIS — K219 Gastro-esophageal reflux disease without esophagitis: Secondary | ICD-10-CM | POA: Diagnosis present

## 2013-05-05 DIAGNOSIS — G35 Multiple sclerosis: Secondary | ICD-10-CM | POA: Diagnosis present

## 2013-05-05 DIAGNOSIS — M5137 Other intervertebral disc degeneration, lumbosacral region: Principal | ICD-10-CM | POA: Diagnosis present

## 2013-05-05 DIAGNOSIS — G2581 Restless legs syndrome: Secondary | ICD-10-CM | POA: Diagnosis present

## 2013-05-05 DIAGNOSIS — F329 Major depressive disorder, single episode, unspecified: Secondary | ICD-10-CM | POA: Diagnosis present

## 2013-05-05 DIAGNOSIS — M51379 Other intervertebral disc degeneration, lumbosacral region without mention of lumbar back pain or lower extremity pain: Principal | ICD-10-CM | POA: Diagnosis present

## 2013-05-05 DIAGNOSIS — F3289 Other specified depressive episodes: Secondary | ICD-10-CM | POA: Diagnosis present

## 2013-05-05 DIAGNOSIS — F411 Generalized anxiety disorder: Secondary | ICD-10-CM | POA: Diagnosis present

## 2013-05-05 DIAGNOSIS — M129 Arthropathy, unspecified: Secondary | ICD-10-CM | POA: Diagnosis present

## 2013-05-05 DIAGNOSIS — Z981 Arthrodesis status: Secondary | ICD-10-CM

## 2013-05-05 HISTORY — PX: SPINAL FUSION: SHX223

## 2013-05-05 SURGERY — POSTERIOR LUMBAR FUSION 1 LEVEL
Anesthesia: General | Site: Back | Wound class: Clean

## 2013-05-05 MED ORDER — PROPOFOL 10 MG/ML IV BOLUS
INTRAVENOUS | Status: DC | PRN
  Administered 2013-05-05: 200 mg via INTRAVENOUS

## 2013-05-05 MED ORDER — CLONAZEPAM 1 MG PO TABS
1.0000 mg | ORAL_TABLET | Freq: Three times a day (TID) | ORAL | Status: DC | PRN
  Administered 2013-05-05 – 2013-05-06 (×3): 1 mg via ORAL
  Filled 2013-05-05 (×3): qty 1

## 2013-05-05 MED ORDER — METHOCARBAMOL 500 MG PO TABS
ORAL_TABLET | ORAL | Status: AC
  Administered 2013-05-05: 500 mg
  Filled 2013-05-05: qty 1

## 2013-05-05 MED ORDER — DEXAMETHASONE SODIUM PHOSPHATE 4 MG/ML IJ SOLN
4.0000 mg | Freq: Once | INTRAMUSCULAR | Status: AC
  Administered 2013-05-05: 8 mg via INTRAVENOUS

## 2013-05-05 MED ORDER — THROMBIN 20000 UNITS EX SOLR
CUTANEOUS | Status: AC
  Filled 2013-05-05: qty 20000

## 2013-05-05 MED ORDER — LACTATED RINGERS IV SOLN
INTRAVENOUS | Status: DC
  Administered 2013-05-05: 09:00:00 via INTRAVENOUS

## 2013-05-05 MED ORDER — DEXAMETHASONE SODIUM PHOSPHATE 4 MG/ML IJ SOLN
4.0000 mg | Freq: Four times a day (QID) | INTRAMUSCULAR | Status: DC
  Administered 2013-05-05 – 2013-05-06 (×2): 4 mg via INTRAVENOUS
  Filled 2013-05-05 (×11): qty 1

## 2013-05-05 MED ORDER — NALOXONE HCL 0.4 MG/ML IJ SOLN: 0.4000 mg | INTRAMUSCULAR | Status: DC | PRN

## 2013-05-05 MED ORDER — DULOXETINE HCL 60 MG PO CPEP
120.0000 mg | ORAL_CAPSULE | Freq: Every day | ORAL | Status: DC
  Administered 2013-05-06 – 2013-05-07 (×2): 120 mg via ORAL
  Filled 2013-05-05 (×3): qty 2

## 2013-05-05 MED ORDER — VANCOMYCIN HCL IN DEXTROSE 1-5 GM/200ML-% IV SOLN
1000.0000 mg | Freq: Two times a day (BID) | INTRAVENOUS | Status: AC
  Administered 2013-05-05 – 2013-05-06 (×2): 1000 mg via INTRAVENOUS
  Filled 2013-05-05 (×2): qty 200

## 2013-05-05 MED ORDER — PROPOFOL INFUSION 10 MG/ML OPTIME
INTRAVENOUS | Status: DC | PRN
  Administered 2013-05-05: 75 ug/kg/min via INTRAVENOUS

## 2013-05-05 MED ORDER — ZOLPIDEM TARTRATE 5 MG PO TABS
5.0000 mg | ORAL_TABLET | Freq: Every evening | ORAL | Status: DC | PRN
  Administered 2013-05-06: 5 mg via ORAL
  Filled 2013-05-05: qty 1

## 2013-05-05 MED ORDER — DIPHENHYDRAMINE HCL 12.5 MG/5ML PO ELIX: 12.5000 mg | ORAL_SOLUTION | Freq: Four times a day (QID) | ORAL | Status: DC | PRN

## 2013-05-05 MED ORDER — METHOCARBAMOL 100 MG/ML IJ SOLN
500.0000 mg | Freq: Four times a day (QID) | INTRAMUSCULAR | Status: DC | PRN
  Filled 2013-05-05: qty 5

## 2013-05-05 MED ORDER — SODIUM CHLORIDE 0.9 % IJ SOLN
3.0000 mL | INTRAMUSCULAR | Status: DC | PRN
  Administered 2013-05-06: 3 mL via INTRAVENOUS

## 2013-05-05 MED ORDER — LIDOCAINE HCL (CARDIAC) 20 MG/ML IV SOLN
INTRAVENOUS | Status: DC | PRN
  Administered 2013-05-05: 100 mg via INTRAVENOUS

## 2013-05-05 MED ORDER — MORPHINE SULFATE 2 MG/ML IJ SOLN
1.0000 mg | INTRAMUSCULAR | Status: DC | PRN
  Administered 2013-05-05 (×4): 2 mg via INTRAVENOUS

## 2013-05-05 MED ORDER — MORPHINE SULFATE 2 MG/ML IJ SOLN
INTRAMUSCULAR | Status: AC
  Filled 2013-05-05: qty 1

## 2013-05-05 MED ORDER — ONDANSETRON HCL 4 MG/2ML IJ SOLN
INTRAMUSCULAR | Status: DC | PRN
  Administered 2013-05-05: 4 mg via INTRAVENOUS

## 2013-05-05 MED ORDER — THROMBIN 20000 UNITS EX SOLR
OROMUCOSAL | Status: DC | PRN
  Administered 2013-05-05: 11:00:00 via TOPICAL

## 2013-05-05 MED ORDER — BUPIVACAINE-EPINEPHRINE 0.25% -1:200000 IJ SOLN
INTRAMUSCULAR | Status: DC | PRN
  Administered 2013-05-05: 10 mL

## 2013-05-05 MED ORDER — METHOCARBAMOL 500 MG PO TABS
500.0000 mg | ORAL_TABLET | Freq: Four times a day (QID) | ORAL | Status: DC | PRN
  Administered 2013-05-06: 500 mg via ORAL
  Filled 2013-05-05 (×2): qty 1

## 2013-05-05 MED ORDER — PANTOPRAZOLE SODIUM 40 MG PO TBEC
40.0000 mg | DELAYED_RELEASE_TABLET | Freq: Every day | ORAL | Status: DC
  Administered 2013-05-06 – 2013-05-07 (×2): 40 mg via ORAL
  Filled 2013-05-05 (×2): qty 1

## 2013-05-05 MED ORDER — PHENOL 1.4 % MT LIQD: 1.0000 | OROMUCOSAL | Status: DC | PRN

## 2013-05-05 MED ORDER — ACETAMINOPHEN 10 MG/ML IV SOLN
1000.0000 mg | Freq: Four times a day (QID) | INTRAVENOUS | Status: DC
  Administered 2013-05-05 – 2013-05-06 (×3): 1000 mg via INTRAVENOUS
  Filled 2013-05-05 (×4): qty 100

## 2013-05-05 MED ORDER — DIPHENHYDRAMINE HCL 50 MG/ML IJ SOLN: 25.0000 mg | Freq: Four times a day (QID) | INTRAMUSCULAR | Status: DC | PRN

## 2013-05-05 MED ORDER — FENTANYL 10 MCG/ML IV SOLN
INTRAVENOUS | Status: DC
  Administered 2013-05-05: 16:00:00 via INTRAVENOUS
  Administered 2013-05-06: 150 ug via INTRAVENOUS
  Administered 2013-05-06: 225 ug via INTRAVENOUS
  Filled 2013-05-05 (×2): qty 50

## 2013-05-05 MED ORDER — FENTANYL CITRATE 0.05 MG/ML IJ SOLN
INTRAMUSCULAR | Status: DC | PRN
  Administered 2013-05-05 (×2): 50 ug via INTRAVENOUS
  Administered 2013-05-05: 100 ug via INTRAVENOUS
  Administered 2013-05-05 (×3): 50 ug via INTRAVENOUS
  Administered 2013-05-05: 100 ug via INTRAVENOUS
  Administered 2013-05-05: 50 ug via INTRAVENOUS
  Administered 2013-05-05: 100 ug via INTRAVENOUS

## 2013-05-05 MED ORDER — PREGABALIN 50 MG PO CAPS
100.0000 mg | ORAL_CAPSULE | Freq: Every day | ORAL | Status: DC
  Administered 2013-05-05 – 2013-05-06 (×2): 100 mg via ORAL
  Filled 2013-05-05 (×2): qty 2

## 2013-05-05 MED ORDER — DIPHENHYDRAMINE HCL 25 MG PO CAPS
25.0000 mg | ORAL_CAPSULE | Freq: Four times a day (QID) | ORAL | Status: DC | PRN
  Administered 2013-05-06: 25 mg via ORAL
  Filled 2013-05-05: qty 1

## 2013-05-05 MED ORDER — ALBUMIN HUMAN 5 % IV SOLN
INTRAVENOUS | Status: DC | PRN
  Administered 2013-05-05 (×2): via INTRAVENOUS

## 2013-05-05 MED ORDER — LACTATED RINGERS IV SOLN
INTRAVENOUS | Status: DC | PRN
  Administered 2013-05-05 (×2): via INTRAVENOUS

## 2013-05-05 MED ORDER — HYDROCHLOROTHIAZIDE 25 MG PO TABS
25.0000 mg | ORAL_TABLET | Freq: Every day | ORAL | Status: DC
  Administered 2013-05-05 – 2013-05-07 (×3): 25 mg via ORAL
  Filled 2013-05-05 (×3): qty 1

## 2013-05-05 MED ORDER — ONDANSETRON HCL 4 MG/2ML IJ SOLN: 4.0000 mg | INTRAMUSCULAR | Status: DC | PRN

## 2013-05-05 MED ORDER — SCOPOLAMINE 1 MG/3DAYS TD PT72
MEDICATED_PATCH | TRANSDERMAL | Status: AC
  Administered 2013-05-05: 1 via TRANSDERMAL
  Filled 2013-05-05: qty 1

## 2013-05-05 MED ORDER — PREGABALIN 50 MG PO CAPS
50.0000 mg | ORAL_CAPSULE | Freq: Every day | ORAL | Status: DC
  Administered 2013-05-06 – 2013-05-07 (×2): 50 mg via ORAL
  Filled 2013-05-05 (×2): qty 1

## 2013-05-05 MED ORDER — ARTIFICIAL TEARS OP OINT
TOPICAL_OINTMENT | OPHTHALMIC | Status: DC | PRN
  Administered 2013-05-05: 1 via OPHTHALMIC

## 2013-05-05 MED ORDER — SODIUM CHLORIDE 0.9 % IJ SOLN: 3.0000 mL | Freq: Two times a day (BID) | INTRAMUSCULAR | Status: DC

## 2013-05-05 MED ORDER — SODIUM CHLORIDE 0.9 % IV SOLN: 250.0000 mL | INTRAVENOUS | Status: DC

## 2013-05-05 MED ORDER — AMLODIPINE BESYLATE 5 MG PO TABS
5.0000 mg | ORAL_TABLET | Freq: Every day | ORAL | Status: DC
Start: 2013-05-06 — End: 2013-05-07
  Administered 2013-05-06: 5 mg via ORAL
  Filled 2013-05-05 (×2): qty 1

## 2013-05-05 MED ORDER — SODIUM CHLORIDE 0.9 % IJ SOLN: 9.0000 mL | INTRAMUSCULAR | Status: DC | PRN

## 2013-05-05 MED ORDER — DIPHENHYDRAMINE HCL 50 MG/ML IJ SOLN: 12.5000 mg | Freq: Four times a day (QID) | INTRAMUSCULAR | Status: DC | PRN

## 2013-05-05 MED ORDER — ONDANSETRON HCL 4 MG/2ML IJ SOLN: 4.0000 mg | Freq: Four times a day (QID) | INTRAMUSCULAR | Status: DC | PRN

## 2013-05-05 MED ORDER — PHENTERMINE HCL 37.5 MG PO CAPS: 37.5000 mg | ORAL_CAPSULE | Freq: Every day | ORAL | Status: DC

## 2013-05-05 MED ORDER — SUCCINYLCHOLINE CHLORIDE 20 MG/ML IJ SOLN
INTRAMUSCULAR | Status: DC | PRN
  Administered 2013-05-05: 140 mg via INTRAVENOUS

## 2013-05-05 MED ORDER — MENTHOL 3 MG MT LOZG: 1.0000 | LOZENGE | OROMUCOSAL | Status: DC | PRN

## 2013-05-05 MED ORDER — DEXAMETHASONE 4 MG PO TABS
4.0000 mg | ORAL_TABLET | Freq: Four times a day (QID) | ORAL | Status: DC
  Administered 2013-05-05 – 2013-05-07 (×6): 4 mg via ORAL
  Filled 2013-05-05 (×11): qty 1

## 2013-05-05 MED ORDER — MIDAZOLAM HCL 5 MG/5ML IJ SOLN
INTRAMUSCULAR | Status: DC | PRN
  Administered 2013-05-05: 4 mg via INTRAVENOUS

## 2013-05-05 MED ORDER — OXYCODONE HCL 5 MG PO TABS
10.0000 mg | ORAL_TABLET | ORAL | Status: DC | PRN
  Administered 2013-05-06 (×3): 10 mg via ORAL
  Filled 2013-05-05 (×3): qty 2

## 2013-05-05 MED ORDER — PREGABALIN 50 MG PO CAPS: 50.0000 mg | ORAL_CAPSULE | Freq: Two times a day (BID) | ORAL | Status: DC

## 2013-05-05 MED ORDER — BUPIVACAINE-EPINEPHRINE PF 0.25-1:200000 % IJ SOLN
INTRAMUSCULAR | Status: AC
  Filled 2013-05-05: qty 30

## 2013-05-05 MED ORDER — LACTATED RINGERS IV SOLN
INTRAVENOUS | Status: DC
  Administered 2013-05-05: 85 mL/h via INTRAVENOUS

## 2013-05-05 SURGICAL SUPPLY — 73 items
BLADE SURG ROTATE 9660 (MISCELLANEOUS) IMPLANT
BUR EGG ELITE 4.0 (BURR) IMPLANT
CLIP NEUROVISION LG (CLIP) ×2 IMPLANT
CLOTH BEACON ORANGE TIMEOUT ST (SAFETY) ×2 IMPLANT
CLSR STERI-STRIP ANTIMIC 1/2X4 (GAUZE/BANDAGES/DRESSINGS) ×2 IMPLANT
CORDS BIPOLAR (ELECTRODE) ×2 IMPLANT
COVER MAYO STAND STRL (DRAPES) ×4 IMPLANT
COVER SURGICAL LIGHT HANDLE (MISCELLANEOUS) ×2 IMPLANT
DRAPE C-ARM 42X72 X-RAY (DRAPES) ×2 IMPLANT
DRAPE ORTHO SPLIT 77X108 STRL (DRAPES) ×1
DRAPE POUCH INSTRU U-SHP 10X18 (DRAPES) ×2 IMPLANT
DRAPE SURG 17X23 STRL (DRAPES) ×2 IMPLANT
DRAPE SURG ORHT 6 SPLT 77X108 (DRAPES) ×1 IMPLANT
DRAPE U-SHAPE 47X51 STRL (DRAPES) ×2 IMPLANT
DRSG MEPILEX BORDER 4X8 (GAUZE/BANDAGES/DRESSINGS) ×2 IMPLANT
DURAPREP 26ML APPLICATOR (WOUND CARE) ×2 IMPLANT
ELECT BLADE 4.0 EZ CLEAN MEGAD (MISCELLANEOUS) ×2
ELECT BLADE 6.5 EXT (BLADE) IMPLANT
ELECT REM PT RETURN 9FT ADLT (ELECTROSURGICAL) ×2
ELECTRODE BLDE 4.0 EZ CLN MEGD (MISCELLANEOUS) ×1 IMPLANT
ELECTRODE REM PT RTRN 9FT ADLT (ELECTROSURGICAL) ×1 IMPLANT
GLOVE BIOGEL PI IND STRL 8 (GLOVE) ×1 IMPLANT
GLOVE BIOGEL PI IND STRL 8.5 (GLOVE) ×1 IMPLANT
GLOVE BIOGEL PI INDICATOR 8 (GLOVE) ×1
GLOVE BIOGEL PI INDICATOR 8.5 (GLOVE) ×1
GLOVE ECLIPSE 8.5 STRL (GLOVE) ×2 IMPLANT
GLOVE ORTHO TXT STRL SZ7.5 (GLOVE) ×2 IMPLANT
GOWN PREVENTION PLUS XXLARGE (GOWN DISPOSABLE) ×2 IMPLANT
GOWN STRL NON-REIN LRG LVL3 (GOWN DISPOSABLE) ×2 IMPLANT
GOWN STRL REIN 2XL XLG LVL4 (GOWN DISPOSABLE) ×2 IMPLANT
GUIDEWIRE NITINOL BEVEL TIP (WIRE) ×8 IMPLANT
KIT BASIN OR (CUSTOM PROCEDURE TRAY) ×2 IMPLANT
KIT NEEDLE NVM5 EMG ELECT (KITS) ×1 IMPLANT
KIT NEEDLE NVM5 EMG ELECTRODE (KITS) ×1
KIT POSITION SURG JACKSON T1 (MISCELLANEOUS) ×2 IMPLANT
KIT ROOM TURNOVER OR (KITS) ×2 IMPLANT
LIGHT SOURCE ANGLE TIP STR 7FT (MISCELLANEOUS) ×2 IMPLANT
NEEDLE 22X1 1/2 (OR ONLY) (NEEDLE) ×2 IMPLANT
NEEDLE I-PASS III (NEEDLE) ×4 IMPLANT
NEEDLE SPNL 18GX3.5 QUINCKE PK (NEEDLE) ×4 IMPLANT
NS IRRIG 1000ML POUR BTL (IV SOLUTION) ×2 IMPLANT
PACK LAMINECTOMY ORTHO (CUSTOM PROCEDURE TRAY) ×2 IMPLANT
PACK UNIVERSAL I (CUSTOM PROCEDURE TRAY) ×2 IMPLANT
PAD ARMBOARD 7.5X6 YLW CONV (MISCELLANEOUS) ×4 IMPLANT
PATTIES SURGICAL .5 X.5 (GAUZE/BANDAGES/DRESSINGS) IMPLANT
PATTIES SURGICAL .5 X1 (DISPOSABLE) ×2 IMPLANT
PRECEPT SHANK 6.5X45 (Neuro Prosthesis/Implant) ×2 IMPLANT
PRECEPT SHANK CANN 6.5X40 (Neuro Prosthesis/Implant) ×2 IMPLANT
PRECEPT TULIPS (Neuro Prosthesis/Implant) ×4 IMPLANT
PROBE BALL TIP NVM5 SNG USE (BALLOONS) ×2 IMPLANT
PUTTY DBX 2.5CC (Putty) ×2 IMPLANT
PUTTY DBX 2.5CC DEPUY (Putty) ×1 IMPLANT
ROD PREBENT 40MM (Rod) ×4 IMPLANT
SCREW PRECEPT 6.5X45 (Screw) ×2 IMPLANT
SCREW PRECEPT POLYAXIAL 6.5X35 (Screw) ×2 IMPLANT
SCREW PRECEPT SET (Screw) ×8 IMPLANT
SHEET CONFORM 45LX20WX7H (Bone Implant) ×2 IMPLANT
SPONGE LAP 4X18 X RAY DECT (DISPOSABLE) ×4 IMPLANT
SPONGE SURGIFOAM ABS GEL 100 (HEMOSTASIS) ×2 IMPLANT
SURGIFLO TRUKIT (HEMOSTASIS) IMPLANT
SURGIFLO W/THROMBIN 8M KIT (HEMOSTASIS) ×2 IMPLANT
SUT MON AB 3-0 SH 27 (SUTURE) ×2
SUT MON AB 3-0 SH27 (SUTURE) ×2 IMPLANT
SUT VIC AB 1 CTX 18 (SUTURE) ×2 IMPLANT
SUT VIC AB 2-0 CT1 18 (SUTURE) ×2 IMPLANT
SYR BULB IRRIGATION 50ML (SYRINGE) ×2 IMPLANT
SYR CONTROL 10ML LL (SYRINGE) ×2 IMPLANT
TLIF XLRG 11MM (Neuro Prosthesis/Implant) ×2 IMPLANT
TOWEL OR 17X24 6PK STRL BLUE (TOWEL DISPOSABLE) ×2 IMPLANT
TOWEL OR 17X26 10 PK STRL BLUE (TOWEL DISPOSABLE) ×2 IMPLANT
TRAY FOLEY CATH 16FRSI W/METER (SET/KITS/TRAYS/PACK) ×2 IMPLANT
WATER STERILE IRR 1000ML POUR (IV SOLUTION) ×2 IMPLANT
YANKAUER SUCT BULB TIP NO VENT (SUCTIONS) ×2 IMPLANT

## 2013-05-05 NOTE — Anesthesia Preprocedure Evaluation (Addendum)
Anesthesia Evaluation  Patient identified by MRN, date of birth, ID band Patient awake    Reviewed: Allergy & Precautions, H&P , NPO status , Patient's Chart, lab work & pertinent test results  History of Anesthesia Complications (+) PONV  Airway Mallampati: II TM Distance: >3 FB Neck ROM: Full    Dental   Pulmonary  breath sounds clear to auscultation        Cardiovascular Rhythm:Regular Rate:Normal     Neuro/Psych  Headaches, Anxiety Depression Restless leg Sx  Neuromuscular disease    GI/Hepatic GERD-  ,  Endo/Other    Renal/GU      Musculoskeletal   Abdominal (+) + obese,   Peds  Hematology   Anesthesia Other Findings   Reproductive/Obstetrics                           Anesthesia Physical Anesthesia Plan  ASA: II  Anesthesia Plan: General   Post-op Pain Management:    Induction: Intravenous  Airway Management Planned: Oral ETT  Additional Equipment:   Intra-op Plan:   Post-operative Plan: Extubation in OR  Informed Consent: I have reviewed the patients History and Physical, chart, labs and discussed the procedure including the risks, benefits and alternatives for the proposed anesthesia with the patient or authorized representative who has indicated his/her understanding and acceptance.     Plan Discussed with: CRNA and Surgeon  Anesthesia Plan Comments:         Anesthesia Quick Evaluation

## 2013-05-05 NOTE — Preoperative (Signed)
Beta Blockers   Reason not to administer Beta Blockers:Not Applicable 

## 2013-05-05 NOTE — Transfer of Care (Signed)
Immediate Anesthesia Transfer of Care Note  Patient: Joan Ray  Procedure(s) Performed: Procedure(s): POSTERIOR LUMBAR FUSION (TLIF) 1 LEVEL (N/A)  Patient Location: PACU  Anesthesia Type:General  Level of Consciousness: patient cooperative and responds to stimulation  Airway & Oxygen Therapy: Patient Spontanous Breathing and Patient connected to nasal cannula oxygen  Post-op Assessment: Report given to PACU RN, Post -op Vital signs reviewed and stable and Patient moving all extremities X 4  Post vital signs: Reviewed and stable  Complications: No apparent anesthesia complications

## 2013-05-05 NOTE — Brief Op Note (Signed)
05/05/2013  2:01 PM  PATIENT:  Joan Ray  52 y.o. female  PRE-OPERATIVE DIAGNOSIS:  L5-S1  SLIP WITH STENOSIS   POST-OPERATIVE DIAGNOSIS:  L5-S1  SLIP WITH STENOSIS   PROCEDURE:  Procedure(s): POSTERIOR LUMBAR FUSION (TLIF) 1 LEVEL (N/A)  SURGEON:  Surgeon(s) and Role:    * Venita Lick, MD - Primary  PHYSICIAN ASSISTANT:   ASSISTANTS: Zonia Kief   ANESTHESIA:   general  EBL:  Total I/O In: 2000 [I.V.:1500; IV Piggyback:500] Out: 765 [Urine:465; Blood:300]  BLOOD ADMINISTERED:none  DRAINS: none   LOCAL MEDICATIONS USED:  MARCAINE     SPECIMEN:  No Specimen  DISPOSITION OF SPECIMEN:  N/A  COUNTS:  YES  TOURNIQUET:  * No tourniquets in log *  DICTATION: .Other Dictation: Dictation Number [id number]  PLAN OF CARE: Admit for overnight observation  PATIENT DISPOSITION:  PACU - hemodynamically stable.

## 2013-05-05 NOTE — Anesthesia Postprocedure Evaluation (Signed)
Anesthesia Post Note  Patient: Joan Ray  Procedure(s) Performed: Procedure(s) (LRB): POSTERIOR LUMBAR FUSION (TLIF) 1 LEVEL (N/A)  Anesthesia type: general  Patient location: PACU  Post pain: Pain level controlled  Post assessment: Patient's Cardiovascular Status Stable  Last Vitals:  Filed Vitals:   05/05/13 1627  BP:   Pulse:   Temp:   Resp: 12    Post vital signs: Reviewed and stable  Level of consciousness: sedated  Complications: No apparent anesthesia complications

## 2013-05-06 ENCOUNTER — Inpatient Hospital Stay (HOSPITAL_COMMUNITY): Payer: BC Managed Care – PPO

## 2013-05-06 MED ORDER — CYCLOBENZAPRINE HCL 10 MG PO TABS: 5.0000 mg | ORAL_TABLET | Freq: Two times a day (BID) | ORAL | Status: DC | PRN

## 2013-05-06 MED ORDER — POLYETHYLENE GLYCOL 3350 17 G PO PACK: 17.0000 g | PACK | Freq: Every day | ORAL | Status: DC

## 2013-05-06 MED ORDER — OXYCODONE HCL 10 MG PO TABS: 5.0000 mg | ORAL_TABLET | ORAL | Status: DC | PRN

## 2013-05-06 MED ORDER — OXYCODONE HCL ER 15 MG PO T12A
15.0000 mg | EXTENDED_RELEASE_TABLET | Freq: Two times a day (BID) | ORAL | Status: DC
  Administered 2013-05-06 – 2013-05-07 (×3): 15 mg via ORAL
  Filled 2013-05-06 (×3): qty 1

## 2013-05-06 MED ORDER — OXYCODONE HCL 10 MG PO TABS: 10.0000 mg | ORAL_TABLET | ORAL | Status: DC | PRN

## 2013-05-06 MED ORDER — DOCUSATE SODIUM 100 MG PO CAPS: 100.0000 mg | ORAL_CAPSULE | Freq: Two times a day (BID) | ORAL | Status: DC

## 2013-05-06 MED ORDER — OXYCODONE-ACETAMINOPHEN 5-325 MG PO TABS: 1.0000 | ORAL_TABLET | ORAL | Status: DC | PRN

## 2013-05-06 MED ORDER — ACETAMINOPHEN 500 MG PO TABS
1000.0000 mg | ORAL_TABLET | Freq: Once | ORAL | Status: AC
  Administered 2013-05-06: 1000 mg via ORAL
  Filled 2013-05-06: qty 2

## 2013-05-06 MED ORDER — CYCLOBENZAPRINE HCL 10 MG PO TABS
10.0000 mg | ORAL_TABLET | Freq: Three times a day (TID) | ORAL | Status: DC | PRN
  Administered 2013-05-06 – 2013-05-07 (×2): 10 mg via ORAL
  Filled 2013-05-06 (×2): qty 1

## 2013-05-06 MED ORDER — OXYCODONE HCL ER 15 MG PO T12A: 15.0000 mg | EXTENDED_RELEASE_TABLET | Freq: Two times a day (BID) | ORAL | Status: DC

## 2013-05-06 MED ORDER — ONDANSETRON 4 MG PO TBDP: 4.0000 mg | ORAL_TABLET | Freq: Three times a day (TID) | ORAL | Status: DC | PRN

## 2013-05-06 NOTE — Evaluation (Signed)
Occupational Therapy Evaluation and Discharge Summary Patient Details Name: Joan Ray MRN: [id number] DOB: 1960/09/30 Today's Date: 05/06/2013 Time: 1308-6578 OT Time Calculation (min): 45 min  OT Assessment / Plan / Recommendation History of present illness Pt is a 52 yo female who underwent a L5-S1 decompression/insertion of biomechanical intervertebral disk and fusion.  Pt w h/o MS and also has issues with feet that need surgery, one for which she wears a boot PRN for comfort.   Clinical Impression   Pt admitted for the above diagnosis and overall is doing well.  Pt with h/o MS which makes mobilizing mildly more difficult.  Pt introduced to all equipment and feels good about adls overall.  Husband home to assist 24/7.  No further acute OT needs.    OT Assessment  All further OT needs can be met in the next venue of care    Follow Up Recommendations  Home health OT;Supervision/Assistance - 24 hour    Barriers to Discharge      Equipment Recommendations  None recommended by OT;Other (comment) (rec AE pack from gift shop.  Family to acquire.)    Recommendations for Other Services    Frequency       Precautions / Restrictions Precautions Precautions: Back Precaution Comments: Reviewed all back precautions.  Required Braces or Orthoses: Spinal Brace Spinal Brace: Applied in sitting position Restrictions Weight Bearing Restrictions: No   Pertinent Vitals/Pain Pt with pain of 5/10 in back.  Other vitals stable.    ADL  Eating/Feeding: Performed;Independent Where Assessed - Eating/Feeding: Chair Grooming: Performed;Wash/dry hands;Supervision/safety Where Assessed - Grooming: Supported standing Upper Body Bathing: Simulated;Set up Where Assessed - Upper Body Bathing: Supported sitting Lower Body Bathing: Simulated;Moderate assistance Where Assessed - Lower Body Bathing: Supported sit to stand Upper Body Dressing: Performed;Set up Where Assessed - Upper Body Dressing:  Supported sitting Lower Body Dressing: Performed;Moderate assistance Where Assessed - Lower Body Dressing: Supported sit to Pharmacist, hospital: Performed;Minimal assistance Toilet Transfer Method: Other (comment) (walked to br.) Toilet Transfer Equipment: Comfort height toilet;Grab bars Toileting - Clothing Manipulation and Hygiene: Performed;Minimal assistance Where Assessed - Engineer, mining and Hygiene: Sit to stand from 3-in-1 or toilet Equipment Used: Rolling walker;Back brace Transfers/Ambulation Related to ADLs: Pt required min assist just to get into standing.  Cues given for correct hand placement for all transfers.  Pt mildly impulsive with mobiltiy. ADL Comments: Pt was introduced to all adaptive equipment and was told where to purchase it.  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  Pt also introduced to toilet aid.    OT Diagnosis: Generalized weakness;Acute pain  OT Problem List: Decreased strength;Pain;Decreased knowledge of use of DME or AE OT Treatment Interventions:     OT Goals(Current goals can be found in the care plan section) Acute Rehab OT Goals Patient Stated Goal: to finish all these surgeries.  Visit Information  Last OT Received On: 05/06/13 Assistance Needed: +1 History of Present Illness: Pt is a 52 yo female who underwent a L5-S1 decompression/insertion of biomechanical intervertebral disk and fusion.  Pt w h/o MS and also has issues with feet that need surgery, one for which she wears a boot PRN for comfort.       Prior Functioning     Home Living Family/patient expects to be discharged to:: Private residence Living Arrangements: Spouse/significant other;Children Available Help at Discharge: Family;Available 24 hours/day Type of Home: House Home Access: Stairs to enter [employer] of Steps: 7 Entrance Stairs-Rails: Right;Left Home Layout: One  level Home Equipment: Shower seat - built in;Bedside  commode;Wheelchair - manual;Other (comment) (had walker from 2012 but gave it away.) Additional Comments: Pt needs walker bc she gave her walker away from 2012 Prior Function Level of Independence: Independent with assistive device(s) Comments: Pt works as Engineer, civil (consulting) for [employer].  Worked up intil this surgery but has been in significant pain. Communication Communication: No difficulties Dominant Hand: Right         Vision/Perception Vision - History Baseline Vision: Wears glasses all the time Patient Visual Report: No change from baseline Vision - Assessment Vision Assessment: Vision not tested   Cognition  Cognition Arousal/Alertness: Awake/alert Behavior During Therapy: WFL for tasks assessed/performed Overall Cognitive Status: Within Functional Limits for tasks assessed    Extremity/Trunk Assessment Upper Extremity Assessment Upper Extremity Assessment: Overall WFL for tasks assessed Lower Extremity Assessment Lower Extremity Assessment: Defer to PT evaluation Cervical / Trunk Assessment Cervical / Trunk Assessment: Normal     Mobility Bed Mobility Bed Mobility: Rolling Right;Right Sidelying to Sit;Sitting - Scoot to Edge of Bed Rolling Right: 5: Supervision Right Sidelying to Sit: 5: Supervision Sitting - Scoot to Edge of Bed: 5: Supervision Details for Bed Mobility Assistance: Pt used bedrails to assist with mobilty. Transfers Transfers: Sit to Stand;Stand to Sit Sit to Stand: 4: Min guard Stand to Sit: 5: Supervision Details for Transfer Assistance: Constant cues for hand placement.  Pt likes to push up on walker.  Instructed her to push off surface she is leaving so walker does not tip.     Exercise     Balance Balance Balance Assessed: Yes Static Standing Balance Static Standing - Balance Support: Bilateral upper extremity supported;During functional activity Static Standing - Level of Assistance: 5: Stand by assistance Static Standing - Comment/# of Minutes: 3  at sink   End of Session OT - End of Session Equipment Utilized During Treatment: Rolling walker;Back brace Activity Tolerance: Patient tolerated treatment well Patient left: in chair;with call bell/phone within reach;with family/visitor present Nurse Communication: Mobility status  GO     Hope Budds 05/06/2013, 11:40 AM [phone]

## 2013-05-06 NOTE — Evaluation (Signed)
Physical Therapy Evaluation Patient Details Name: Genice Kimberlin MRN: [id number] DOB: Dec 26, 1960 Today's Date: 05/06/2013 Time: 4098-1191 PT Time Calculation (min): 31 min  PT Assessment / Plan / Recommendation History of Present Illness  Pt is a 52 yo female who underwent a L5-S1 decompression/insertion of biomechanical intervertebral disk and fusion.  Pt w h/o MS and also has issues with feet that need surgery, one for which she wears a boot PRN for comfort.  Clinical Impression  This patient presents with acute pain and decreased functional independence following the above mentioned procedure. At the time of PT eval, pt required frequent verbal and tactile cueing to maintain back precautions, as she was very impulsive with her movements. This patient is appropriate for skilled PT interventions to address functional limitations, improve safety and independence with functional mobility, and return to PLOF.    PT Assessment  Patient needs continued PT services    Follow Up Recommendations  Home health PT    Does the patient have the potential to tolerate intense rehabilitation      Barriers to Discharge        Equipment Recommendations  3in1 (PT)    Recommendations for Other Services     Frequency Min 5X/week    Precautions / Restrictions Precautions Precautions: Back Precaution Booklet Issued: Yes (comment) Precaution Comments: Reviewed all back precautions.  Required Braces or Orthoses: Spinal Brace Spinal Brace: Applied in sitting position Restrictions Weight Bearing Restrictions: No   Pertinent Vitals/Pain "not bad" when asked about pain. Would not rate on 0-10 scale.      Mobility  Bed Mobility Bed Mobility: Rolling Right;Right Sidelying to Sit;Sitting - Scoot to Edge of Bed Rolling Right: 5: Supervision;With rail Right Sidelying to Sit: 5: Supervision;With rails Sitting - Scoot to Edge of Bed: 5: Supervision;With rail Details for Bed Mobility Assistance: VC's to  maintain back precautions throughout transfer to EOB. Transfers Transfers: Sit to Stand;Stand to Sit Sit to Stand: 4: Min guard;From bed;From toilet;With upper extremity assist Stand to Sit: 5: Supervision;To toilet;To bed Details for Transfer Assistance: VC's for hand placement on seated surface and back precautions. Pt raised bed height to simulate her bed at home.Difficulty maintaining back precautions to sit back down on a raised bed. Ambulation/Gait Ambulation/Gait Assistance: 4: Min guard Ambulation Distance (Feet): 100 Feet Assistive device: Rolling walker Ambulation/Gait Assistance Details: VC's to maintain back precautions as she was looking around the hallway. Somewhat impulsive in taking hands off walker and twisting to look at a picture or object in the hall.  Gait Pattern: Step-through pattern;Decreased stride length Gait velocity: decreased Stairs: No    Exercises     PT Diagnosis: Difficulty walking;Acute pain  PT Problem List: Decreased strength;Decreased range of motion;Decreased activity tolerance;Decreased balance;Decreased mobility;Decreased knowledge of use of DME;Decreased safety awareness;Decreased knowledge of precautions;Pain PT Treatment Interventions: DME instruction;Gait training;Stair training;Functional mobility training;Therapeutic activities;Therapeutic exercise;Neuromuscular re-education;Patient/family education     PT Goals(Current goals can be found in the care plan section) Acute Rehab PT Goals Patient Stated Goal: to finish all these surgeries. PT Goal Formulation: With patient/family Time For Goal Achievement: 05/13/13 Potential to Achieve Goals: Good  Visit Information  Last PT Received On: 05/06/13 Assistance Needed: +1 History of Present Illness: Pt is a 52 yo female who underwent a L5-S1 decompression/insertion of biomechanical intervertebral disk and fusion.  Pt w h/o MS and also has issues with feet that need surgery, one for which she  wears a boot PRN for comfort.  Prior Functioning  Home Living Family/patient expects to be discharged to:: Private residence Living Arrangements: Spouse/significant other;Children Available Help at Discharge: Family;Available 24 hours/day Type of Home: House Home Access: Stairs to enter [employer] of Steps: 7 Entrance Stairs-Rails: Right;Left Home Layout: One level Home Equipment: Shower seat - built in;Bedside commode;Wheelchair - manual;Other (comment) Additional Comments: Pt needs walker bc she gave her walker away from 2012 Prior Function Level of Independence: Independent with assistive device(s) Comments: Pt works as Engineer, civil (consulting) for [employer].  Worked up intil this surgery but has been in significant pain. Communication Communication: No difficulties Dominant Hand: Right    Cognition  Cognition Arousal/Alertness: Awake/alert Behavior During Therapy: WFL for tasks assessed/performed Overall Cognitive Status: Within Functional Limits for tasks assessed    Extremity/Trunk Assessment Upper Extremity Assessment Upper Extremity Assessment: Overall WFL for tasks assessed Lower Extremity Assessment Lower Extremity Assessment: Overall WFL for tasks assessed Cervical / Trunk Assessment Cervical / Trunk Assessment: Normal   Balance Balance Balance Assessed: Yes Static Standing Balance Static Standing - Balance Support: Bilateral upper extremity supported;During functional activity Static Standing - Level of Assistance: 5: Stand by assistance Static Standing - Comment/# of Minutes: 2 minutes washing hands at sink  End of Session PT - End of Session Equipment Utilized During Treatment: Gait belt;Back brace (Boot on R foot for unrelated issue) Activity Tolerance: Patient tolerated treatment well Patient left: in bed;with call bell/phone within reach;with family/visitor present Nurse Communication: Mobility status  GP     Ruthann Cancer 05/06/2013, 3:16 PM  Ruthann Cancer, PT, DPT [phone]

## 2013-05-06 NOTE — Progress Notes (Signed)
    Subjective: Procedure(s) (LRB): POSTERIOR LUMBAR FUSION (TLIF) 1 LEVEL (N/A) 1 Day Post-Op  Patient reports pain as 4 on 0-10 scale.  Reports decreased leg pain reports incisional back pain   Positive void Negative bowel movement Positive flatus Negative chest pain or shortness of breath  Objective: Vital signs in last 24 hours: Temp:  [95.9 F (35.5 C)-99 F (37.2 C)] 97.9 F (36.6 C) (10/31 0830) Pulse Rate:  [68-95] 68 (10/31 0830) Resp:  [8-18] 17 (10/31 0830) BP: (81-119)/(51-76) 104/74 mmHg (10/31 1027) SpO2:  [90 %-100 %] 95 % (10/31 0830)  Intake/Output from previous day: 10/30 0701 - 10/31 0700 In: 3976.6 [P.O.:480; I.V.:2996.6; IV Piggyback:500] Out: 2190 [Urine:1890; Blood:300]  Labs: No results found for this basename: WBC, RBC, HCT, PLT,  in the last 72 hours No results found for this basename: NA, K, CL, CO2, BUN, CREATININE, GLUCOSE, CALCIUM,  in the last 72 hours No results found for this basename: LABPT, INR,  in the last 72 hours  Physical Exam: Neurologically intact ABD soft Neurovascular intact Intact pulses distally Incision: dressing C/D/I and no drainage Compartment soft  Assessment/Plan: Patient stable  xrays satisfactory Continue mobilization with physical therapy Continue care  Advance diet Up with therapy D/C IV fluids Plan for discharge tomorrow Discharge home with home health Adjust pain control - oxycontin and oxycodone.  Change robaxin to flexeril    Venita Lick, MD [facility] [phone]

## 2013-05-06 NOTE — Op Note (Signed)
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  POSTOPERATIVE DIAGNOSIS:  Grade 1 borderline to degenerative spondylolisthesis, L5-S1, with radicular leg pain.  OPERATIVE PROCEDURES: 1. Gill decompression, left side, L5-S1. 2. Complete diskectomy, L5-S1, with insertion of biomechanical     intervertebral device. 3. Posterolateral arthrodesis, L5-S1. 4. Instrumented fusion with pedicle screw fixation, L5-S1.     Instrumentation system used was a Titan titanium TLIF cage, size 11     x 33, with NuVasive pedicle screw fixation, 45-mm length screws at     L5, and 35-mm length screw on the right side of S1, and 40-mm screw     on the left side of S1.  COMPLICATIONS:  None.  CONDITION:  Stable.  FIRST ASSISTANT:  Genene Churn. Barry Dienes, my PA  HISTORY:  This is a very pleasant 52 year old woman who has been having severe debilitating back, buttock, and bilateral leg pain, left side more severe than the right.  Attempts at conservative management had failed to control or alleviate her symptoms.  As a result of the progressive loss of function and pain, she elected to proceed with surgery.  All appropriate risks, benefits, and alternatives were discussed with the patient and consent was obtained.  OPERATIVE NOTE:  The patient was brought to the operating room, placed supine on the operating table.  After successful induction of general anesthesia and endotracheal intubation, TEDs, SCDs, and Foley were inserted.  SSEP, EMG nerve needle monitors were then inserted and secured to the patient.  She was then turned prone  onto the [place] frame.  All bony prominences were well padded.  The back was then prepped and draped in a standard fashion.  Time-out was taken to confirm the patient, procedure, affected extremity, and all other pertinent important data.  Once this was completed, x-ray was brought into the field sterilely and identified the lateral borders of the L5-S1 pedicles bilaterally.  I infiltrated this area with 0.25% Marcaine with lidocaine.  I then made a small vertical incision, centered on the lateral aspect of the L5 pedicle on the right side.  I then advanced the Jamshidi needle percutaneously down to the junction of the transverse process and facet joint.  I confirmed position and trajectory with x- ray.  I then connected the Jamshidi needle to our intraoperative monitoring device and then advanced the Jamshidi needle using x-ray and neuro monitoring.  Once I was at the medial border of the pedicle on the AP view, I switched to the lateral.  I had excellent trajectory and I was just beyond the posterior wall of the vertebral body.  I then advanced the deeper and then placed the guide pin.  I then removed the Jamshidi needle.  I repeated this at the S1 level.  Once I had both pedicles cannulated, I  then tapped over the guide pin and then placed the appropriate size screws into these levels.  At this point, once the right-sided pedicle screws were in position, I then went to the left side.  Rather making 2 vertical incisions for the screws, I made one long incision going from the superior aspect of L5 to the inferior aspect of S1.  I dissected down and incised the deep fascia.  I then placed a Jamshidi needle through the paraspinal muscle down to the junction at left L5 transverse process and facet.  Using the same technique that I had used on the contralateral side, I advanced the Jamshidi needle down and then placed the guide pin.  I repeated this at the S1 level.  Once both left screw,  the left pedicle of L5 and S1 were cannulated, I tapped and then placed the pedicle screw at both levels. The pedicle screw was connected to the retracting arm.  I then built the retractor in order to visualize the posterolateral aspect of the spine. At this point, I removed any remaining fragments of paraspinal muscle and exposed the S1 and L5 lamina and the L5-S1 facet complex.  Once this was completed, I then proceeded with my decompression.  I performed a generous laminotomy of L5 and then released the ligamentum flavum from the leading edge of the S1.  I then trimmed the leading edge of the S1 lamina as well in order to have better visualization.  I then removed the ligamentum flavum and exposed the underlying thecal sac.  I then used an osteotome to remove the entire inferior L5 facet complex. Once this was removed, I then took away the remaining L5 pars until I could see the L5 nerve root.  At this point, I then traced it back to its origin of the thecal sac.  I could now palpate, with a [facility], the medial border of the L5 pedicle, as well as the inferior port.  I also could trace the L5 nerve root from the thecal sac around the pedicle and out the foramen.  I then went inferiorly and removed the overhanging osteophyte from the superior S1 facet complex until I could visualize the S1 nerve root.  I could then palpate the medial and superior aspect of the S1 pedicle.  Neither pedicle demonstrate any evidence of breach.  At this point, I then coagulated the large epidural veins with bipolar electrocautery, and placed neural patty to protect the L5 nerve root and a second one to help retract the thecal sac.  I now could visualize the posterior lateral aspect of the disk space.  I incised the annulus with a 15 blade scalpel and used a combination of pituitary rongeurs, curettes, and Kerrison rongeurs to remove all of the disk material from the L5-S1 level.  I could now  clearly palpate and visualize the endplates of L5 and S1.  I then used a rasp to make sure I had bleeding subchondral bone.  I then took the bone that I had obtained from the decompression, packed it into the appropriate size cage and mixed it with DBX.  Once this was done, I then malleted the cage into the L5-S1 space and then descended from a vertical position into a horizontal position.  I had excellent fixation with a cage.  At this point, I then placed the sheet of contour, autograft, allograft bone behind the cage, so that it would aid in a fusion.  I then checked to ensure that  neither the nerve root or the thecal sac was impacted by either the contoured graft sheet or the cage.  Once I was confirmed of this, I then irrigated the wound copiously with normal saline.  I obtained hemostasis using bipolar electrocautery and then placed the thrombin-soaked Gelfoam patty over the exposed thecal sac.  I closed the deep fascia.  I then removed the retracting blade and then placed the appropriate sized pedicle head, polyaxial heads onto the L5 and S1 screws.  An appropriate sized rod was then placed into the pedicle screws and torqued down with the facet locking screws.  This was torqued to a manufacturer's standards.  At this point, the TLIF portion was completed.  I irrigated the wound again, closed the deep fascia with interrupted #1 Vicryl sutures, superficial 2-0 Vicryl sutures, and 3-0 Monocryl for the skin.  I then went back to the right-hand side, measured for a rod, placed a rod, and locked it down according to [employer].  I then disrupted the facet complex using a curette and packed some bone back there for posterolateral fusion.  At this point, the wounds on the right side were irrigated, closed with 2-0 Vicryl sutures and a 3-0 Monocryl.  Final x-rays were taken which were satisfactory.  Hardware and graft were in good position.  At this point, Steri-Strips, dry  dressing were applied.  The patient was awoken, extubated, and transferred to the PACU.  At the end of the case, all needle and sponge counts were correct.  There were no adverse intraoperative events.     Alvy Beal, MD     DDB/MEDQ  D:  05/05/2013  T:  05/06/2013  Job:  [id number]

## 2013-05-07 NOTE — Progress Notes (Signed)
Subjective: 2 Days Post-Op Procedure(s) (LRB): POSTERIOR LUMBAR FUSION (TLIF) 1 LEVEL (N/A) Patient reports pain as moderate.  Well controlled with oral pain meds.  R foot feeling ok this morning.  Objective: Vital signs in last 24 hours: Temp:  [98.1 F (36.7 C)-98.7 F (37.1 C)] 98.5 F (36.9 C) (11/01 0600) Pulse Rate:  [82-88] 86 (11/01 0600) Resp:  [18] 18 (11/01 0600) BP: (101-108)/(43-74) 108/48 mmHg (11/01 0600) SpO2:  [98 %-99 %] 99 % (11/01 0600)  Intake/Output from previous day: 10/31 0701 - 11/01 0700 In: 243 [P.O.:240; I.V.:3] Out: 500 [Urine:500] Intake/Output this shift:    No results found for this basename: HGB,  in the last 72 hours No results found for this basename: WBC, RBC, HCT, PLT,  in the last 72 hours No results found for this basename: NA, K, CL, CO2, BUN, CREATININE, GLUCOSE, CALCIUM,  in the last 72 hours No results found for this basename: LABPT, INR,  in the last 72 hours  PE:  wn wd woman in nad.  L spine dressing dry and intact.  NVI at B LEs.  Assessment/Plan: 2 Days Post-Op Procedure(s) (LRB): POSTERIOR LUMBAR FUSION (TLIF) 1 LEVEL (N/A) D/c home today.  HEWITTJonny Ruiz 05/07/2013, 10:03 AM

## 2013-05-07 NOTE — Progress Notes (Signed)
Physical Therapy Treatment Patient Details Name: Joan Ray MRN: [id number] DOB: [birth year] Today's Date: 05/07/2013 Time: 4098-1191 PT Time Calculation (min): 23 min  PT Assessment / Plan / Recommendation  History of Present Illness Pt is a 52 yo female who underwent a L5-S1 decompression/insertion of biomechanical intervertebral disk and fusion.  Pt w h/o MS and also has issues with feet that need surgery, one for which she wears a boot PRN for comfort.   PT Comments   Pt moving well.  Completed stair training & reviewed bed mobility multiple times due to pt with concerns about being able to perform when she gets home.  Pt safe to d/c home when MD feels medically ready.     Follow Up Recommendations  Home health PT     Does the patient have the potential to tolerate intense rehabilitation     Barriers to Discharge        Equipment Recommendations  3in1 (PT)    Recommendations for Other Services    Frequency Min 5X/week   Progress towards PT Goals Progress towards PT goals: Progressing toward goals  Plan Current plan remains appropriate    Precautions / Restrictions Precautions Precautions: Back Required Braces or Orthoses: Spinal Brace Spinal Brace: Applied in sitting position Restrictions Weight Bearing Restrictions: No   Pertinent Vitals/Pain 7/10 back.  Repositioned for comfort.  Premedicated.      Mobility  Bed Mobility Bed Mobility: Rolling Right;Rolling Left;Right Sidelying to Sit;Left Sidelying to Sit;Sit to Supine Rolling Right: 5: Supervision Rolling Left: 5: Supervision Right Sidelying to Sit: 5: Supervision;HOB flat Left Sidelying to Sit: 5: Supervision;HOB flat Details for Bed Mobility Assistance: Cues to reinforce "no twisting" Transfers Transfers: Sit to Stand;Stand to Sit Sit to Stand: 4: Min guard;4: Min assist;With upper extremity assist;From bed;Other (comment) (ortho mat table) Stand to Sit: 4: Min guard;With upper extremity assist;With  armrests;To chair/3-in-1;Other (comment) (ortho mat table) Details for Transfer Assistance: Min (A) to achieve standing & stabilize RW from low surface (ortho gym mat table).  Cues for safest hand placement & no twisting with standing Ambulation/Gait Ambulation/Gait Assistance: 4: Min guard Ambulation Distance (Feet): 140 Feet Assistive device: Rolling walker Ambulation/Gait Assistance Details: cues to maintain back precautions-- "no twisting" as she was looking around hallway Gait Pattern: Step-through pattern;Decreased stride length Gait velocity: decreased Stairs: Yes Stairs Assistance: 4: Min guard Stairs Assistance Details (indicate cue type and reason): Cues for sequencing & technique.   Stair Management Technique: One rail Left;Step to pattern;Forwards Number of Stairs: 4 Wheelchair Mobility Wheelchair Mobility: No      PT Goals (current goals can now be found in the care plan section) Acute Rehab PT Goals PT Goal Formulation: With patient/family Time For Goal Achievement: 05/13/13 Potential to Achieve Goals: Good  Visit Information  Last PT Received On: 05/07/13 Assistance Needed: +1 History of Present Illness: Pt is a 52 yo female who underwent a L5-S1 decompression/insertion of biomechanical intervertebral disk and fusion.  Pt w h/o MS and also has issues with feet that need surgery, one for which she wears a boot PRN for comfort.    Subjective Data      Cognition  Cognition Arousal/Alertness: Awake/alert Behavior During Therapy: WFL for tasks assessed/performed Overall Cognitive Status: Within Functional Limits for tasks assessed    Balance     End of Session PT - End of Session Equipment Utilized During Treatment: Back brace Activity Tolerance: Patient tolerated treatment well Patient left: in chair;with call bell/phone within reach;with family/visitor present Nurse  Communication: Mobility status   GP     Lara Mulch 05/07/2013, 11:34 AM  Verdell Face, PTA [phone] 05/07/2013

## 2013-05-07 NOTE — Progress Notes (Signed)
05/07/2013 1645 NCM contacted pt and offered choice for HH. Pt states she had [facility] in the past and [facility] as second choice. Contacted [facility] and they have availability for 11/4 soc. Will confirm and notify NCM. Isidoro Donning RN CCM Case Mgmt phone [phone]

## 2013-05-07 NOTE — Progress Notes (Signed)
Patient states she has 3 in 1 at home with walker, no equipment needed for this discharge.

## 2013-05-09 NOTE — Progress Notes (Signed)
05/09/2013 0850 Genevieve Norlander accepted referral. Isidoro Donning RN CCM Case Mgmt phone [phone]

## 2013-05-11 NOTE — H&P (Signed)
History of Present Illness The patient is a 52 year old female who presents today for follow up of their back. The patient is being followed for their right-sided back pain. They are now 2 month(s) out from last visit. Symptoms reported today include: pain (pain radiates into right thigh, left side is hurting worse today than her right side), pain at night, swelling (in bilateral hands), aching, difficulty arising from chair (in buttocks and hips), numbness (in bilateral hands), burning, pain with lying, pain with lifting, pain with sitting and pain with standing. The patient states that they are doing poorly. The following medication has been used for pain control: none. The patient reports their current pain level to be moderate to severe and 8 / 10. The patient presents today following MRI (MRI Brain and Cervical Spine done on 04/19/2013 done at [facility] in [place]) and ESI (done on 01/25/2013, only improved pain for the day of the injection, did not help otherwise).   Subjective Transcription  The patient returns today for follow up, pre op to her surgery next week for TLIF at L5-S1.    Allergies DARVOCET. 01/14/2007 CODEINE. 01/14/2007 BETADINE. 01/14/2007 TYLOX. 01/14/2007 VICODIN. 01/14/2007 KEFLEX. 01/14/2007 CEFPROZIL. 01/14/2007 ERYTHROMYCIN. 01/14/2007 VALIUM. 01/14/2007 EDTA. 01/14/2007 Zonisamide *ANTICONVULSANTS* Neurontin *ANTICONVULSANTS* Lipitor *ANTIHYPERLIPIDEMICS* Keppra *ANTICONVULSANTS* LaMICtal *ANTICONVULSANTS* Topamax *ANTICONVULSANTS* DILAUDID Doxycycline Hyclate *TETRACYCLINES* HYDROcodone Bitartrate *COUGH/COLD/ALLERGY* Clindamycin HCl *CHEMICALS*    Social History Children. 3 Alcohol use. 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 1 Tobacco use. Never smoker. never  smoker Marital status. married Drug/Alcohol Rehab (Previously). no Drug/Alcohol Rehab (Currently). no Exercise. Exercises daily; does other Living situation. live with spouse Illicit drug use. no    Medication History HCTZ (25 mg, 1 daily) Active. Cymbalta (1 (120mg ) Oral daily) Specific dose unknown - Active. ZyrTEC ( Oral) Specific dose unknown - Active. Norvasc ( Oral) Specific dose unknown - Active. Senna Lax (8.6MG  Tablet, 1 Oral two times daily) Active. Lidoderm (5% Patch, External as needed) Active. Zomig ZMT (5MG  Tablet Disperse, Oral as needed) Active. Tysabri (300MG /15ML Concentrate, Intravenous) Active. NexIUM ( Oral) Specific dose unknown - Active. Ambien CR (12.5MG  Tablet ER, 1 Oral at bedtime) Active. ClonazePAM ( Oral) Specific dose unknown - Active. Flexeril (10MG  Tablet, 1 Oral three times daily) Active. Fenofibrate (145MG  Tablet, Oral) Active. Krill Oil (500MG  Capsule, 1 Oral at bedtime) Active. CoQ10 (200MG  Capsule, 1 Oral daily) Active. Vitamin D (50000UNIT Capsule, 1 Oral once a week) Active. Phentermine HCl (37.5MG  Capsule, 1 Oral daily) Active. Lyrica (50MG  Capsule, Oral) Active. CeleBREX ( Oral) Specific dose unknown - Active. Medications Reconciled.    Vitals 04/26/2013 10:36 AM Weight: 227 lb Height: 68 in Body Surface Area: 2.22 m Body Mass Index: 34.51 kg/m Temp.: 97.9 FPulse: 72 (Regular) BP: 114/75 (Sitting, Left Arm, Standard)     Objective Transcription  She continues to appear younger than her stated age. She is alert and oriented times three. She has had increasing neck and radicular bilateral arm pain. She describes numbness and dysesthesias. On clinical exam she is a pleasant woman who appears younger than her stated age. She is alert and oriented times three. She has neck pain with palpation and range of motion. She has 5/5 strength in the upper extremity and lower extremity. She has significant  back pain and neck pain with range of motion and palpation. She has urinary urgency, but no incontinence  of bowel and bladder. The abdomen is soft and nontender. No shortness of breath or chest pain. Compartment are soft and nontender. Intact peripheral pulses throughout. Negative Babinski test. No clonus. Negative Hoffman's sign.   RADIOGRAPHS:  MRI of her cervical spine was done on 04/19/13. Currently we do not have the written results and we are having technical difficulties in bring up the MRI itself.    At this point we have gone back over her lumbar MRI. She has the L5-S1 slip with disc compression and foraminal stenosis, right side more pronounced than the left, which correlates with her symptoms.       Assessment & Plan   Plans Transcription  At this point plan would be to proceed with transforaminal lumbar interbody fusion L5-S1. This would allow for direct right sided nerve decompression to address the neuropathic pain and stabilization to prevent worsening of the slip. My hope is reduction, not elimination in her pain, improved quality of life. Again, we have gone over the risks of surgery. The risks of that include infection, bleeding, nerve damage, death, stoke, paralysis, failure to heal, need for further surgery, ongoing or worse pain, loss of bowel and bladder control, leak of spinal fluid, blood clots, adjacent segment disease. All of her questions were encouraged and addressed. We will plan on doing the surgery. My hope is that in three months she is recovered enough that she can have the right foot surgery that Dr. Victorino Dike discussed and then we can see how she does overall.

## 2013-05-11 NOTE — Discharge Summary (Signed)
Patient ID: Joan Ray MRN: [id number] DOB/AGE: 01/01/1961 52 y.o.  Admit date: 05/05/2013 Discharge date: 05/11/2013  Admission Diagnoses:  Active Problems:   * No active hospital problems. *   Discharge Diagnoses:  Active Problems:   * No active hospital problems. *  status post Procedure(s): POSTERIOR LUMBAR FUSION (TLIF) 1 LEVEL  Past Medical History  Diagnosis Date  . Complication of anesthesia   . PONV (postoperative nausea and vomiting)   . GERD (gastroesophageal reflux disease)   . Headache(784.0)     migraines  . 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 Arthritis     osteo   . Anemia     h/o of   Last infusion of iron  2008  . Anxiety   . Depression     d/t decreased activity  . Restless leg syndrome     Surgeries: Procedure(s): POSTERIOR LUMBAR FUSION (TLIF) 1 LEVEL on 05/05/2013   Consultants:    Discharged Condition: Improved  Hospital Course: Harris Kistler is an 52 y.o. female who was admitted 05/05/2013 for operative treatment of <principal problem not specified>. Patient failed conservative treatments (please see the history and physical for the specifics) and had severe unremitting pain that affects sleep, daily activities and work/hobbies. After pre-op clearance, the patient was taken to the operating room on 05/05/2013 and underwent  Procedure(s): POSTERIOR LUMBAR FUSION (TLIF) 1 LEVEL.    Patient was given perioperative antibiotics:  Anti-infectives   Start     Dose/Rate Route Frequency Ordered Stop   05/05/13 1830  vancomycin (VANCOCIN) IVPB 1000 mg/200 mL premix     1,000 mg 200 mL/hr over 60 Minutes Intravenous Every 12 hours 05/05/13 1714 05/06/13 1125   05/04/13 1534  ceFAZolin (ANCEF) IVPB 2 g/50 mL premix     2 g 100 mL/hr over 30 Minutes Intravenous 30 min pre-op 05/04/13 1534 05/05/13 1038   05/04/13 1533  vancomycin (VANCOCIN) IVPB 1000 mg/200 mL premix  Status:  Discontinued     1,000 mg 200 mL/hr  over 60 Minutes Intravenous Every 12 hours 05/04/13 1533 05/05/13 1714       Patient was given sequential compression devices and early ambulation to prevent DVT.   Patient benefited maximally from hospital stay and there were no complications. At the time of discharge, the patient was urinating/moving their bowels without difficulty, tolerating a regular diet, pain is controlled with oral pain medications and they have been cleared by PT/OT.   Recent vital signs: No data found.    Recent laboratory studies: No results found for this basename: WBC, HGB, HCT, PLT, NA, K, CL, CO2, BUN, CREATININE, GLUCOSE, PT, INR, CALCIUM, 2,  in the last 72 hours   Discharge Medications:     Medication List    STOP taking these medications       acetaminophen 325 MG tablet  Commonly known as:  TYLENOL     celecoxib 200 MG capsule  Commonly known as:  CELEBREX     lidocaine 5 %  Commonly known as:  LIDODERM     OVER THE COUNTER MEDICATION     zolpidem 12.5 MG CR tablet  Commonly known as:  AMBIEN CR     ZOMIG 5 MG tablet  Generic drug:  zolmitriptan      TAKE these medications       amLODipine 10 MG tablet  Commonly known as:  NORVASC  Take 5 mg by mouth daily.     cetirizine  10 MG tablet  Commonly known as:  ZYRTEC  Take 10 mg by mouth daily.     cimetidine 200 MG tablet  Commonly known as:  TAGAMET  Take 200 mg by mouth daily as needed (for migraines).     clonazePAM 1 MG tablet  Commonly known as:  KLONOPIN  Take 1 mg by mouth 3 (three) times daily as needed for anxiety.     CO Q 10 PO  Take by mouth.     cyclobenzaprine 10 MG tablet  Commonly known as:  FLEXERIL  Take 0.5 tablets (5 mg total) by mouth 2 (two) times daily as needed for muscle spasms.     docusate sodium 100 MG capsule  Commonly known as:  COLACE  Take 1 capsule (100 mg total) by mouth 2 (two) times daily.     DULoxetine 60 MG capsule  Commonly known as:  CYMBALTA  Take 120 mg by mouth daily.       esomeprazole 40 MG capsule  Commonly known as:  NEXIUM  Take 40 mg by mouth at bedtime.     fenofibrate 145 MG tablet  Commonly known as:  TRICOR  Take 145 mg by mouth daily.     hydrochlorothiazide 25 MG tablet  Commonly known as:  HYDRODIURIL  Take 25 mg by mouth daily.     ondansetron 4 MG disintegrating tablet  Commonly known as:  ZOFRAN ODT  Take 1 tablet (4 mg total) by mouth every 8 (eight) hours as needed for nausea.     OxyCODONE 15 mg T12a 12 hr tablet  Commonly known as:  OXYCONTIN  Take 1 tablet (15 mg total) by mouth every 12 (twelve) hours.     oxyCODONE-acetaminophen 5-325 MG per tablet  Commonly known as:  ROXICET  Take 1 tablet by mouth every 4 (four) hours as needed for pain.     phentermine 37.5 MG capsule  Take 37.5 mg by mouth daily before breakfast.     polyethylene glycol packet  Commonly known as:  MIRALAX / GLYCOLAX  Take 17 g by mouth daily.     pregabalin 50 MG capsule  Commonly known as:  LYRICA  Take 50-100 mg by mouth 2 (two) times daily. Takes tablet in am and 2 tablets in pm     rizatriptan 10 MG tablet  Commonly known as:  MAXALT  Take 10 mg by mouth every 2 (two) hours as needed for migraine. May repeat in 2 hours if needed     TYSABRI 300 MG/15ML injection  Generic drug:  natalizumab  Inject 300 mg into the vein every 30 (thirty) days.        Diagnostic Studies: Dg Lumbar Spine 2-3 Views  05/06/2013   CLINICAL DATA:  Lumbar fusion  EXAM: LUMBAR SPINE - 2-3 VIEW  COMPARISON:  04/27/2013  FINDINGS: Bilateral pedicle screws and cross-table as enlarged with a disc spacer have been placed at L5-S1. Alignment is dramatically improved with trace anterolisthesis L5 upon S1. No breakage or loosening of the hardware. No vertebral compression deformity.  IMPRESSION: Posterior L5-S1 fusion.  No complication.   Electronically Signed   By: Maryclare Bean M.D.   On: 05/06/2013 08:06   Dg Lumbar Spine 2-3 Views  05/05/2013   CLINICAL DATA:  L5-S1  fixation.  EXAM: DG C-ARM GT 120 MIN; LUMBAR SPINE - 2-3 VIEW  COMPARISON:  04/27/2013 radiographs.  FINDINGS: AP and lateral intraoperative views. These demonstrate L5-S1 trans pedicle screw fixation. No acute hardware complication.  Minimal motion degradation on the lateral view. Interbody fusion material is centrally positioned. The L5-S1 malalignment is resolved.  IMPRESSION: Intraoperative imaging of L5-S1 fixation.   Electronically Signed   By: Jeronimo Greaves M.D.   On: 05/05/2013 16:04   Dg Lumbar Spine 2-3 Views  04/27/2013   CLINICAL DATA:  Preop fusion  EXAM: LUMBAR SPINE - 2-3 VIEW  COMPARISON:  None.  FINDINGS: Five lumbar type vertebral bodies.  Normal lumbar lordosis.  No evidence of fracture dislocation. Vertebral body heights are maintained.  Mild multilevel degenerative changes, most prominent at L5-S1, with grade 1 anterolisthesis of L5 on S1.  IMPRESSION: Grade 1 anterolisthesis of L5 on S1.  Mild multilevel degenerative changes.   Electronically Signed   By: Charline Bills M.D.   On: 04/27/2013 09:49   Dg C-arm Gt 120 Min  05/05/2013   CLINICAL DATA:  L5-S1 fixation.  EXAM: DG C-ARM GT 120 MIN; LUMBAR SPINE - 2-3 VIEW  COMPARISON:  04/27/2013 radiographs.  FINDINGS: AP and lateral intraoperative views. These demonstrate L5-S1 trans pedicle screw fixation. No acute hardware complication. Minimal motion degradation on the lateral view. Interbody fusion material is centrally positioned. The L5-S1 malalignment is resolved.  IMPRESSION: Intraoperative imaging of L5-S1 fixation.   Electronically Signed   By: Jeronimo Greaves M.D.   On: 05/05/2013 16:04        Discharge Orders   Future Orders Complete By Expires   Call MD / Call 911  As directed    Comments:     If you experience chest pain or shortness of breath, CALL 911 and be transported to the hospital emergency room.  If you develope a fever above 101 F, pus (white drainage) or increased drainage or redness at the wound, or calf pain,  call your surgeon's office.   Constipation Prevention  As directed    Comments:     Drink plenty of fluids.  Prune juice may be helpful.  You may use a stool softener, such as Colace (over the counter) 100 mg twice a day.  Use MiraLax (over the counter) for constipation as needed.   Diet - low sodium heart healthy  As directed    Discharge instructions  As directed    Comments:     Ok to shower 5 days postop.  Do not apply any creams or ointments to incision.  Do not remove steri-strips.  Can use 4x4 gauze and tape for dressing changes.  No aggressive activity.  No bending, squatting or prolonged sitting.  Mostly be in reclined position or lying down.  Wear brace.   Driving restrictions  As directed    Comments:     No driving until further notice.   Increase activity slowly as tolerated  As directed    Lifting restrictions  As directed    Comments:     No lifting until further notice.      Follow-up Information   Schedule an appointment as soon as possible for a visit with Alvy Beal, MD. (need return office visit 2 weeks postop)    Specialty:  Orthopedic Surgery   Contact information:   [address] [place] [place] [address] [phone]       Discharge Plan:  discharge to home  Disposition: stable    Signed: Venita Lick D for Dr. Venita Lick [facility] [phone] 05/11/2013, 8:04 AM

## 2013-07-22 ENCOUNTER — Encounter (HOSPITAL_BASED_OUTPATIENT_CLINIC_OR_DEPARTMENT_OTHER): Payer: Self-pay | Admitting: *Deleted

## 2013-07-22 NOTE — Progress Notes (Signed)
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

## 2013-07-27 ENCOUNTER — Other Ambulatory Visit: Payer: Self-pay | Admitting: Orthopedic Surgery

## 2013-07-27 NOTE — H&P (Signed)
Joan Ray is an 53 y.o. female.   Chief Complaint: Right midfoot pain HPI: Pt reports to OR for surgical correction of midfoot arthritis and tight heel cords.  Pt denies N/V/F/C, chest pain, SOB, calf pain, and paresthesia bilaterally.  Past Medical History  Diagnosis Date  . Complication of anesthesia   . PONV (postoperative nausea and vomiting)   . GERD (gastroesophageal reflux disease)   . Headache(784.0)     migraines  . 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 Arthritis     osteo   . Anemia     h/o of   Last infusion of iron  2008  . Anxiety   . Depression     d/t decreased activity  . Restless leg syndrome   . MS (multiple sclerosis)   . Wears glasses     Past Surgical History  Procedure Laterality Date  . Bladder suspension      occas  blood in urine  . Knee arthroscopy      bilateral  in 2010, has multiple scopes  . Stress fracture      right 2-3 toe  . Medial partial knee replacement      right  . Nerve blocks    . Joint replacement  2012    lt total knee  . Total knee arthroplasty  2008    partial rt   . Endometrial ablation  2010    History reviewed. No pertinent family history. Social History:  reports that she has never smoked. She does not have any smokeless tobacco history on file. She reports that she drinks alcohol. She reports that she does not use illicit drugs.  Allergies:  Allergies  Allergen Reactions  . Betadine [Povidone Iodine]     Blisters  . Codeine Itching    Rash  . Darvocet [Propoxyphene N-Acetaminophen] Nausea And Vomiting  . Diazepam     Increases agitation  . Dilaudid [Hydromorphone Hcl] Itching  . Doxycycline     Facial flushing  . Keppra [Levetiracetam]     Shaking, nervousness, palpations and flushing  . Lamictal [Lamotrigine]     Causes tongue and mouth to tingle  . Lipitor [Atorvastatin]     Severe muscle and joint pain / ms relapse  . Neurontin [Gabapentin]     Impaired cognition, slower  motor skills (intolerant)  . Other     EDTA causes blisters  . Topamax [Topiramate]     Confusion  . Zonisamide     Back pain, headaches, insomnia, chest pressure  . Cephalosporins Rash  . Erythromycin Rash  . Keflex [Cephalexin] Rash    No prescriptions prior to admission    No results found for this or any previous visit (from the past 79 hour(s)). No results found.  Review of Systems  Constitutional: Negative.   HENT: Negative.   Eyes: Negative.   Respiratory: Negative.   Cardiovascular: Negative.   Gastrointestinal: Negative.   Musculoskeletal: Negative.   Skin: Negative.   Neurological: Negative for focal weakness.  Endo/Heme/Allergies: Negative.   Psychiatric/Behavioral: The patient is not nervous/anxious.     Height 5\' 8"  (1.727 m), weight 104.327 kg (230 lb). Physical Exam  WD WN 53y/o female in NAD, A/Ox3, appears stated age.  EOMI, mood and affect normal, respirations unlabored. +TTP to right 2nd and 3rd TMT joints focally.  5/5 strength of ankle DF, PF, inversion, eversion.  Sensitivity to light touch intact throughout, DP pulse 2+ bilaterally, skin healthy and intact  Assessment/Plan  Right midfoot arthritis and tight heelcord - Pt reports to OR for right midfoot arthrodesis with gastroc recession of the right ankle.  The risks and benefits of the alternative treatment options have been discussed in detail.  The patient wishes to proceed with surgery and specifically understands risks of bleeding, infection, nerve damage, blood clots, need for additional surgery, amputation and death.    FLOWERS, CHRISTOPHER S 07/27/2013, 1:42 PM  Agree with above note.

## 2013-07-28 ENCOUNTER — Ambulatory Visit (HOSPITAL_BASED_OUTPATIENT_CLINIC_OR_DEPARTMENT_OTHER)
Admission: RE | Admit: 2013-07-28 | Discharge: 2013-07-28 | Disposition: A | Discharge status: Home / Self Care | Payer: BC Managed Care – PPO | Source: Ambulatory Visit | Attending: Orthopedic Surgery | Admitting: Orthopedic Surgery

## 2013-07-28 ENCOUNTER — Encounter (HOSPITAL_BASED_OUTPATIENT_CLINIC_OR_DEPARTMENT_OTHER): Payer: BC Managed Care – PPO | Admitting: Anesthesiology

## 2013-07-28 ENCOUNTER — Encounter (HOSPITAL_BASED_OUTPATIENT_CLINIC_OR_DEPARTMENT_OTHER)
Admission: RE | Disposition: A | Discharge status: Home / Self Care | Payer: Self-pay | Source: Ambulatory Visit | Attending: Orthopedic Surgery

## 2013-07-28 ENCOUNTER — Encounter (HOSPITAL_BASED_OUTPATIENT_CLINIC_OR_DEPARTMENT_OTHER): Payer: Self-pay | Admitting: Certified Registered"

## 2013-07-28 ENCOUNTER — Ambulatory Visit (HOSPITAL_BASED_OUTPATIENT_CLINIC_OR_DEPARTMENT_OTHER): Payer: BC Managed Care – PPO | Admitting: Anesthesiology

## 2013-07-28 DIAGNOSIS — M6240 Contracture of muscle, unspecified site: Secondary | ICD-10-CM | POA: Insufficient documentation

## 2013-07-28 DIAGNOSIS — G35 Multiple sclerosis: Secondary | ICD-10-CM | POA: Insufficient documentation

## 2013-07-28 DIAGNOSIS — K219 Gastro-esophageal reflux disease without esophagitis: Secondary | ICD-10-CM | POA: Insufficient documentation

## 2013-07-28 DIAGNOSIS — M19079 Primary osteoarthritis, unspecified ankle and foot: Secondary | ICD-10-CM | POA: Insufficient documentation

## 2013-07-28 HISTORY — DX: Multiple sclerosis: G35

## 2013-07-28 HISTORY — DX: Presence of spectacles and contact lenses: Z97.3

## 2013-07-28 HISTORY — PX: FOOT ARTHRODESIS: SHX1655

## 2013-07-28 HISTORY — PX: GASTROCNEMIUS RECESSION: SHX863

## 2013-07-28 HISTORY — DX: Multiple sclerosis, unspecified: G35.D

## 2013-07-28 LAB — POCT HEMOGLOBIN-HEMACUE: HEMOGLOBIN: 14.6 g/dL (ref 12.0–15.0)

## 2013-07-28 SURGERY — FUSION, JOINT, FOOT
Anesthesia: General | Site: Leg Lower | Laterality: Right

## 2013-07-28 MED ORDER — MIDAZOLAM HCL 2 MG/2ML IJ SOLN
INTRAMUSCULAR | Status: AC
  Filled 2013-07-28: qty 2

## 2013-07-28 MED ORDER — PROMETHAZINE HCL 25 MG/ML IJ SOLN: 6.2500 mg | INTRAMUSCULAR | Status: DC | PRN

## 2013-07-28 MED ORDER — MIDAZOLAM HCL 2 MG/2ML IJ SOLN
1.0000 mg | INTRAMUSCULAR | Status: DC | PRN
  Administered 2013-07-28: 3 mg via INTRAVENOUS

## 2013-07-28 MED ORDER — FENTANYL CITRATE 0.05 MG/ML IJ SOLN: 50.0000 ug | INTRAMUSCULAR | Status: DC | PRN

## 2013-07-28 MED ORDER — BUPIVACAINE-EPINEPHRINE PF 0.5-1:200000 % IJ SOLN
INTRAMUSCULAR | Status: DC | PRN
  Administered 2013-07-28: 45 mL

## 2013-07-28 MED ORDER — 0.9 % SODIUM CHLORIDE (POUR BTL) OPTIME
TOPICAL | Status: DC | PRN
  Administered 2013-07-28: 350 mL

## 2013-07-28 MED ORDER — MORPHINE SULFATE 2 MG/ML IJ SOLN
1.0000 mg | INTRAMUSCULAR | Status: DC | PRN
  Administered 2013-07-28 (×2): 1 mg via INTRAVENOUS

## 2013-07-28 MED ORDER — FENTANYL CITRATE 0.05 MG/ML IJ SOLN
INTRAMUSCULAR | Status: AC
  Filled 2013-07-28: qty 6

## 2013-07-28 MED ORDER — LIDOCAINE HCL (CARDIAC) 20 MG/ML IV SOLN
INTRAVENOUS | Status: DC | PRN
  Administered 2013-07-28: 60 mg via INTRAVENOUS

## 2013-07-28 MED ORDER — OXYCODONE HCL 5 MG PO TABS: 5.0000 mg | ORAL_TABLET | ORAL | Status: DC | PRN

## 2013-07-28 MED ORDER — OXYCODONE HCL 5 MG/5ML PO SOLN: 5.0000 mg | Freq: Once | ORAL | Status: AC | PRN

## 2013-07-28 MED ORDER — SODIUM CHLORIDE 0.9 % IV SOLN: INTRAVENOUS | Status: DC

## 2013-07-28 MED ORDER — FENTANYL CITRATE 0.05 MG/ML IJ SOLN
INTRAMUSCULAR | Status: AC
  Filled 2013-07-28: qty 2

## 2013-07-28 MED ORDER — PROPOFOL 10 MG/ML IV BOLUS
INTRAVENOUS | Status: DC | PRN
  Administered 2013-07-28: 200 mg via INTRAVENOUS

## 2013-07-28 MED ORDER — MIDAZOLAM HCL 2 MG/2ML IJ SOLN: 1.0000 mg | INTRAMUSCULAR | Status: DC | PRN

## 2013-07-28 MED ORDER — FENTANYL CITRATE 0.05 MG/ML IJ SOLN: 50.0000 ug | Freq: Once | INTRAMUSCULAR | Status: DC

## 2013-07-28 MED ORDER — BACITRACIN ZINC 500 UNIT/GM EX OINT
TOPICAL_OINTMENT | CUTANEOUS | Status: DC | PRN
  Administered 2013-07-28: 1 via TOPICAL

## 2013-07-28 MED ORDER — VANCOMYCIN HCL IN DEXTROSE 1-5 GM/200ML-% IV SOLN
INTRAVENOUS | Status: AC
  Filled 2013-07-28: qty 200

## 2013-07-28 MED ORDER — FENTANYL CITRATE 0.05 MG/ML IJ SOLN
50.0000 ug | Freq: Once | INTRAMUSCULAR | Status: AC
  Administered 2013-07-28: 150 ug via INTRAVENOUS

## 2013-07-28 MED ORDER — VANCOMYCIN HCL IN DEXTROSE 1-5 GM/200ML-% IV SOLN
1000.0000 mg | Freq: Once | INTRAVENOUS | Status: AC
  Administered 2013-07-28: 1000 mg via INTRAVENOUS

## 2013-07-28 MED ORDER — CLINDAMYCIN PHOSPHATE 900 MG/50ML IV SOLN
INTRAVENOUS | Status: AC
  Filled 2013-07-28: qty 50

## 2013-07-28 MED ORDER — BUPIVACAINE-EPINEPHRINE PF 0.5-1:200000 % IJ SOLN
INTRAMUSCULAR | Status: AC
  Filled 2013-07-28: qty 30

## 2013-07-28 MED ORDER — DEXAMETHASONE SODIUM PHOSPHATE 10 MG/ML IJ SOLN
INTRAMUSCULAR | Status: DC | PRN
  Administered 2013-07-28: 10 mg via INTRAVENOUS

## 2013-07-28 MED ORDER — ONDANSETRON HCL 4 MG/2ML IJ SOLN
INTRAMUSCULAR | Status: DC | PRN
  Administered 2013-07-28: 4 mg via INTRAVENOUS

## 2013-07-28 MED ORDER — MORPHINE SULFATE 2 MG/ML IJ SOLN
INTRAMUSCULAR | Status: AC
  Filled 2013-07-28: qty 1

## 2013-07-28 MED ORDER — LACTATED RINGERS IV SOLN
INTRAVENOUS | Status: DC
  Administered 2013-07-28 (×2): via INTRAVENOUS

## 2013-07-28 MED ORDER — OXYCODONE HCL 5 MG PO TABS
5.0000 mg | ORAL_TABLET | Freq: Once | ORAL | Status: AC | PRN
  Administered 2013-07-28: 5 mg via ORAL
  Filled 2013-07-28: qty 1

## 2013-07-28 MED ORDER — MIDAZOLAM HCL 5 MG/5ML IJ SOLN
INTRAMUSCULAR | Status: DC | PRN
  Administered 2013-07-28: 2 mg via INTRAVENOUS

## 2013-07-28 MED ORDER — FENTANYL CITRATE 0.05 MG/ML IJ SOLN
INTRAMUSCULAR | Status: DC | PRN
  Administered 2013-07-28 (×2): 50 ug via INTRAVENOUS

## 2013-07-28 MED ORDER — CHLORHEXIDINE GLUCONATE 4 % EX LIQD: 60.0000 mL | Freq: Once | CUTANEOUS | Status: DC

## 2013-07-28 MED ORDER — CLINDAMYCIN PHOSPHATE 900 MG/50ML IV SOLN: 900.0000 mg | INTRAVENOUS | Status: DC

## 2013-07-28 MED ORDER — DEXAMETHASONE SODIUM PHOSPHATE 10 MG/ML IJ SOLN
INTRAMUSCULAR | Status: DC | PRN
  Administered 2013-07-28: 4 mg

## 2013-07-28 SURGICAL SUPPLY — 99 items
BAG DECANTER FOR FLEXI CONT (MISCELLANEOUS) IMPLANT
BANDAGE ESMARK 6X9 LF (GAUZE/BANDAGES/DRESSINGS) ×2 IMPLANT
BIT DRILL 2.5X2.75 QC CALB (BIT) ×4 IMPLANT
BIT DRILL CALIBRATED 2.7 (BIT) ×3 IMPLANT
BIT DRILL CALIBRATED 2.7MM (BIT) ×1
BLADE AVERAGE 25MMX9MM (BLADE) ×1
BLADE AVERAGE 25X9 (BLADE) ×3 IMPLANT
BLADE MICRO SAGITTAL (BLADE) ×4 IMPLANT
BLADE OSC/SAG .038X5.5 CUT EDG (BLADE) IMPLANT
BLADE SURG 15 STRL LF DISP TIS (BLADE) ×8 IMPLANT
BLADE SURG 15 STRL SS (BLADE) ×8
BNDG COHESIVE 4X5 TAN STRL (GAUZE/BANDAGES/DRESSINGS) ×4 IMPLANT
BNDG COHESIVE 6X5 TAN STRL LF (GAUZE/BANDAGES/DRESSINGS) ×4 IMPLANT
BNDG ESMARK 4X9 LF (GAUZE/BANDAGES/DRESSINGS) IMPLANT
BNDG ESMARK 6X9 LF (GAUZE/BANDAGES/DRESSINGS) ×4
CHLORAPREP W/TINT 26ML (MISCELLANEOUS) ×4 IMPLANT
CLOSURE WOUND 1/2 X4 (GAUZE/BANDAGES/DRESSINGS)
COVER TABLE BACK 60X90 (DRAPES) ×4 IMPLANT
CUFF TOURNIQUET SINGLE 34IN LL (TOURNIQUET CUFF) ×4 IMPLANT
DECANTER SPIKE VIAL GLASS SM (MISCELLANEOUS) IMPLANT
DRAPE C-ARM 42X72 X-RAY (DRAPES) IMPLANT
DRAPE C-ARMOR (DRAPES) IMPLANT
DRAPE EXTREMITY T 121X128X90 (DRAPE) ×4 IMPLANT
DRAPE OEC MINIVIEW 54X84 (DRAPES) ×4 IMPLANT
DRAPE U-SHAPE 47X51 STRL (DRAPES) ×4 IMPLANT
DRILLBIT CANNULATED 2.4 MM ×4 IMPLANT
DRSG EMULSION OIL 3X3 NADH (GAUZE/BANDAGES/DRESSINGS) ×4 IMPLANT
ELECT REM PT RETURN 9FT ADLT (ELECTROSURGICAL) ×4
ELECTRODE REM PT RTRN 9FT ADLT (ELECTROSURGICAL) ×2 IMPLANT
GLOVE BIO SURGEON STRL SZ8 (GLOVE) ×4 IMPLANT
GLOVE BIOGEL PI IND STRL 7.0 (GLOVE) ×4 IMPLANT
GLOVE BIOGEL PI IND STRL 7.5 (GLOVE) IMPLANT
GLOVE BIOGEL PI IND STRL 8 (GLOVE) ×2 IMPLANT
GLOVE BIOGEL PI INDICATOR 7.0 (GLOVE) ×4
GLOVE BIOGEL PI INDICATOR 7.5 (GLOVE)
GLOVE BIOGEL PI INDICATOR 8 (GLOVE) ×2
GLOVE ECLIPSE 6.5 STRL STRAW (GLOVE) ×8 IMPLANT
GLOVE ECLIPSE 7.0 STRL STRAW (GLOVE) ×8 IMPLANT
GLOVE EXAM NITRILE MD LF STRL (GLOVE) ×4 IMPLANT
GOWN STRL REUS W/ TWL LRG LVL3 (GOWN DISPOSABLE) ×4 IMPLANT
GOWN STRL REUS W/ TWL XL LVL3 (GOWN DISPOSABLE) ×2 IMPLANT
GOWN STRL REUS W/TWL LRG LVL3 (GOWN DISPOSABLE) ×4
GOWN STRL REUS W/TWL XL LVL3 (GOWN DISPOSABLE) ×2
KWIRE 1.25 X 150 MM ×20 IMPLANT
NDL SAFETY ECLIPSE 18X1.5 (NEEDLE) IMPLANT
NEEDLE HYPO 18GX1.5 SHARP (NEEDLE)
NEEDLE HYPO 22GX1.5 SAFETY (NEEDLE) IMPLANT
NEEDLE HYPO 25X1 1.5 SAFETY (NEEDLE) IMPLANT
NS IRRIG 1000ML POUR BTL (IV SOLUTION) ×8 IMPLANT
PACK BASIN DAY SURGERY FS (CUSTOM PROCEDURE TRAY) ×4 IMPLANT
PAD ABD 8X10 STRL (GAUZE/BANDAGES/DRESSINGS) ×8 IMPLANT
PAD CAST 4YDX4 CTTN HI CHSV (CAST SUPPLIES) ×2 IMPLANT
PADDING CAST ABS 4INX4YD NS (CAST SUPPLIES)
PADDING CAST ABS COTTON 4X4 ST (CAST SUPPLIES) IMPLANT
PADDING CAST COTTON 4X4 STRL (CAST SUPPLIES) ×2
PADDING CAST COTTON 6X4 STRL (CAST SUPPLIES) ×4 IMPLANT
PENCIL BUTTON HOLSTER BLD 10FT (ELECTRODE) ×4 IMPLANT
PLATE LOCK LG 3.5 (Plate) ×8 IMPLANT
PUTTY DBM STAGRAFT 2CC (Putty) ×4 IMPLANT
SANITIZER HAND PURELL 535ML FO (MISCELLANEOUS) ×4 IMPLANT
SCREW CANN PT 4.0X28 (Screw) ×4 IMPLANT
SCREW CANN PT 4.0X34 (Screw) ×4 IMPLANT
SCREW CANNULATED PT 4.0X40 (Screw) ×8 IMPLANT
SCREW CORT T15 28X3.5XST LCK (Screw) ×2 IMPLANT
SCREW CORT T15 30X3.5XST LCK (Screw) ×2 IMPLANT
SCREW CORTICAL 3.5X28MM (Screw) ×2 IMPLANT
SCREW CORTICAL 3.5X30MM (Screw) ×2 IMPLANT
SCREW CORTICAL LOW PROF 3.5X20 (Screw) ×4 IMPLANT
SCREW LOCK CORT STAR 3.5X20 (Screw) ×4 IMPLANT
SCREW LOCK CORT STAR 3.5X26 (Screw) ×4 IMPLANT
SCREW LOCK CORT STAR 3.5X30 (Screw) ×4 IMPLANT
SCREW LOW PROFILE 22MMX3.5MM (Screw) ×4 IMPLANT
SCREW LP 3.5X38MM (Screw) ×4 IMPLANT
SHEET MEDIUM DRAPE 40X70 STRL (DRAPES) ×8 IMPLANT
SLEEVE SCD COMPRESS KNEE MED (MISCELLANEOUS) ×4 IMPLANT
SPLINT FAST PLASTER 5X30 (CAST SUPPLIES) ×40
SPLINT PLASTER CAST FAST 5X30 (CAST SUPPLIES) ×40 IMPLANT
SPONGE GAUZE 4X4 12PLY (GAUZE/BANDAGES/DRESSINGS) ×4 IMPLANT
SPONGE LAP 18X18 X RAY DECT (DISPOSABLE) ×4 IMPLANT
STAPLER VISISTAT 35W (STAPLE) IMPLANT
STOCKINETTE 6  STRL (DRAPES) ×2
STOCKINETTE 6 STRL (DRAPES) ×2 IMPLANT
STRIP CLOSURE SKIN 1/2X4 (GAUZE/BANDAGES/DRESSINGS) IMPLANT
SUCTION FRAZIER TIP 10 FR DISP (SUCTIONS) ×4 IMPLANT
SUT ETHILON 3 0 PS 1 (SUTURE) ×8 IMPLANT
SUT MNCRL AB 3-0 PS2 18 (SUTURE) ×8 IMPLANT
SUT VIC AB 0 SH 27 (SUTURE) IMPLANT
SUT VIC AB 2-0 SH 18 (SUTURE) IMPLANT
SUT VIC AB 2-0 SH 27 (SUTURE) ×2
SUT VIC AB 2-0 SH 27XBRD (SUTURE) ×2 IMPLANT
SUT VICRYL 4-0 PS2 18IN ABS (SUTURE) IMPLANT
SYR BULB 3OZ (MISCELLANEOUS) ×4 IMPLANT
SYR CONTROL 10ML LL (SYRINGE) IMPLANT
TOWEL OR 17X24 6PK STRL BLUE (TOWEL DISPOSABLE) ×8 IMPLANT
TOWEL OR NON WOVEN STRL DISP B (DISPOSABLE) IMPLANT
TUBE CONNECTING 20'X1/4 (TUBING) ×1
TUBE CONNECTING 20X1/4 (TUBING) ×3 IMPLANT
UNDERPAD 30X30 INCONTINENT (UNDERPADS AND DIAPERS) ×4 IMPLANT
YANKAUER SUCT BULB TIP NO VENT (SUCTIONS) IMPLANT

## 2013-07-28 NOTE — Progress Notes (Signed)
Assisted Dr. Chriss Driver with right, popliteal/saphenous block. Side rails up, monitors on throughout procedure. See vital signs in flow sheet. Tolerated Procedure well.

## 2013-07-28 NOTE — Discharge Instructions (Signed)
Joan Hewitt, MD °[facility] ° °Please read the following information regarding your care after surgery. ° °Medications  °You only need a prescription for the narcotic pain medicine (ex. oxycodone, Percocet, Norco).  All of the other medicines listed below are available over the counter. °X acetominophen (Tylenol) 650 mg every 4-6 hours as you need for minor pain °X oxycodone as prescribed for moderate to severe pain ° °Narcotic pain medicine (ex. oxycodone, Percocet, Vicodin) will cause constipation.  To prevent this problem, take the following medicines while you are taking any pain medicine. °X docusate sodium (Colace) 100 mg twice a day X senna (Senokot) 2 tablets twice a day ° °X To help prevent blood clots, take an aspirin (325 mg) once a day for a month after surgery.  You should also get up every hour while you are awake to move around.   ° °Weight Bearing °? Bear weight when you are able on your operated leg or foot. °? Bear weight only on the heel of your operated foot in the post-op shoe. °X Do not bear any weight on the operated leg or foot. ° °Cast / Splint / Dressing °X Keep your splint or cast clean and dry.  Don’t put anything (coat hanger, pencil, etc) down inside of it.  If it gets damp, use a hair dryer on the cool setting to dry it.  If it gets soaked, call the office to schedule an appointment for a cast change. °? Remove your dressing 3 days after surgery and cover the incisions with dry dressings.   ° °After your dressing, cast or splint is removed; you may shower, but do not soak or scrub the wound.  Allow the water to run over it, and then gently pat it dry. ° °Swelling °It is normal for you to have swelling where you had surgery.  To reduce swelling and pain, keep your toes above your nose for at least 3 days after surgery.  It may be necessary to keep your foot or leg elevated for several weeks.  If it hurts, it should be elevated. ° °Follow Up °Call my office at [phone]  when you are discharged from the hospital or surgery center to schedule an appointment to be seen two weeks after surgery. ° °Call my office at [phone] if you develop a fever >101.5° F, nausea, vomiting, bleeding from the surgical site or severe pain.   ° ° °Post Anesthesia Home Care Instructions ° °Activity: °Get plenty of rest for the remainder of the day. A responsible adult should stay with you for 24 hours following the procedure.  °For the next 24 hours, DO NOT: °-Drive a car °-Operate machinery °-Drink alcoholic beverages °-Take any medication unless instructed by your physician °-Make any legal decisions or sign important papers. ° °Meals: °Start with liquid foods such as gelatin or soup. Progress to regular foods as tolerated. Avoid greasy, spicy, heavy foods. If nausea and/or vomiting occur, drink only clear liquids until the nausea and/or vomiting subsides. Call your physician if vomiting continues. ° °Special Instructions/Symptoms: °Your throat may feel dry or sore from the anesthesia or the breathing tube placed in your throat during surgery. If this causes discomfort, gargle with warm salt water. The discomfort should disappear within 24 hours. ° ° ° °Regional Anesthesia Blocks ° °1. Numbness or the inability to move the "blocked" extremity may last from 3-48 hours after placement. The length of time depends on the medication injected and your individual response to the medication.   If the numbness is not going away after 48 hours, call your surgeon. ° °2. The extremity that is blocked will need to be protected until the numbness is gone and the  Strength has returned. Because you cannot feel it, you will need to take extra care to avoid injury. Because it may be weak, you may have difficulty moving it or using it. You may not know what position it is in without looking at it while the block is in effect. ° °3. For blocks in the legs and feet, returning to weight bearing and walking needs to be  done carefully. You will need to wait until the numbness is entirely gone and the strength has returned. You should be able to move your leg and foot normally before you try and bear weight or walk. You will need someone to be with you when you first try to ensure you do not fall and possibly risk injury. ° °4. Bruising and tenderness at the needle site are common side effects and will resolve in a few days. ° °5. Persistent numbness or new problems with movement should be communicated to the surgeon or the [facility] ([phone])/ [facility] ([phone]). °

## 2013-07-28 NOTE — Anesthesia Procedure Notes (Addendum)
Anesthesia Regional Block:  Popliteal block  Pre-Anesthetic Checklist: ,, timeout performed, Correct Patient, Correct Site, Correct Laterality, Correct Procedure, Correct Position, site marked, Risks and benefits discussed,  Surgical consent,  Pre-op evaluation,  At surgeon's request and post-op pain management  Laterality: Right  Prep: chloraprep       Needles:  Injection technique: Single-shot  Needle Type: Echogenic Stimulator Needle      Needle Gauge: 22 and 22 G    Additional Needles:  Procedures: nerve stimulator Popliteal block  Nerve Stimulator or Paresthesia:  Response: 0.5 mA,   Additional Responses:   Narrative:  Start time: 07/28/2013 9:00 AM End time: 07/28/2013 9:12 AM Injection made incrementally with aspirations every 5 mL. Anesthesiologist: Dr Chriss Driver  Additional Notes: 5732-2025 R Popliteal Nerve Block POP CHG prep, sterile tech #22 stim needle with stim down to .34ma Multiple neg asp Altamese Dilling .5% w/epi 1:200000 total 45cc+ decadron 4mg  infil No compl Dr Chriss Driver   Procedure Name: LMA Insertion Date/Time: 07/28/2013 9:51 AM Performed by: Daziya Redmond Pre-anesthesia Checklist: Patient identified, Emergency Drugs available, Suction available and Patient being monitored Patient Re-evaluated:Patient Re-evaluated prior to inductionOxygen Delivery Method: Circle System Utilized Preoxygenation: Pre-oxygenation with 100% oxygen Intubation Type: IV induction Ventilation: Mask ventilation without difficulty LMA: LMA inserted LMA Size: 4.0 Number of attempts: 1 Airway Equipment and Method: bite block Placement Confirmation: positive ETCO2 Tube secured with: Tape Dental Injury: Teeth and Oropharynx as per pre-operative assessment

## 2013-07-28 NOTE — Transfer of Care (Signed)
Immediate Anesthesia Transfer of Care Note  Patient: Joan Ray  Procedure(s) Performed: Procedure(s): ARTHRODESIS RIGHT MIDFOOT OF THE FIRST -  THIRD TARSOMETATARSAL JOINTS  (Right) RIGHT GASTROCNEMIUS RECESSION (Right)  Patient Location: PACU  Anesthesia Type:GA combined with regional for post-op pain  Level of Consciousness: awake, alert , oriented and patient cooperative  Airway & Oxygen Therapy: Patient Spontanous Breathing and Patient connected to face mask oxygen  Post-op Assessment: Report given to PACU RN and Post -op Vital signs reviewed and stable  Post vital signs: Reviewed and stable  Complications: No apparent anesthesia complications

## 2013-07-28 NOTE — Brief Op Note (Signed)
07/28/2013  12:17 PM  PATIENT:  Caryl Bis  53 y.o. 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  Right gastrocnemius recession 2.  Right 1st, 2nd and 3rd TMT joint arthrodeses 3.  AP, lateral and oblique radiographs of the right foot  SURGEON:  Wylene Simmer, MD  ASSISTANT: n/a  ANESTHESIA:   General, regional  EBL:  minimal   TOURNIQUET:   Total Tourniquet Time Documented: Thigh (Right) - 83 minutes Total: Thigh (Right) - 83 minutes   COMPLICATIONS:  None apparent  DISPOSITION:  Extubated, awake and stable to recovery.  DICTATION ID:  [id number]

## 2013-07-28 NOTE — Anesthesia Preprocedure Evaluation (Signed)
Anesthesia Evaluation  Patient identified by MRN, date of birth, ID band Patient awake    Reviewed: Allergy & Precautions, H&P , NPO status , Patient's Chart, lab work & pertinent test results  History of Anesthesia Complications (+) PONV  Airway Mallampati: II TM Distance: >3 FB Neck ROM: Full    Dental   Pulmonary  breath sounds clear to auscultation        Cardiovascular Rhythm:Regular Rate:Normal     Neuro/Psych  Headaches, Anxiety Depression Restless leg Sx MS  Neuromuscular disease    GI/Hepatic GERD-  ,  Endo/Other    Renal/GU      Musculoskeletal   Abdominal (+) + obese,   Peds  Hematology   Anesthesia Other Findings   Reproductive/Obstetrics                           Anesthesia Physical Anesthesia Plan  ASA: III  Anesthesia Plan: General   Post-op Pain Management:    Induction: Intravenous  Airway Management Planned: LMA  Additional Equipment:   Intra-op Plan:   Post-operative Plan: Extubation in OR  Informed Consent: I have reviewed the patients History and Physical, chart, labs and discussed the procedure including the risks, benefits and alternatives for the proposed anesthesia with the patient or authorized representative who has indicated his/her understanding and acceptance.     Plan Discussed with: CRNA and Surgeon  Anesthesia Plan Comments:         Anesthesia Quick Evaluation

## 2013-07-28 NOTE — Anesthesia Postprocedure Evaluation (Signed)
  Anesthesia Post-op Note  Patient: Joan Ray  Procedure(s) Performed: Procedure(s): ARTHRODESIS RIGHT MIDFOOT OF THE FIRST -  THIRD TARSOMETATARSAL JOINTS  (Right) RIGHT GASTROCNEMIUS RECESSION (Right)  Patient Location: PACU  Anesthesia Type:GA combined with regional for post-op pain  Level of Consciousness: awake and alert   Airway and Oxygen Therapy: Patient Spontanous Breathing  Post-op Pain: mild  Post-op Assessment: Post-op Vital signs reviewed, Patient's Cardiovascular Status Stable, Respiratory Function Stable, Patent Airway, No signs of Nausea or vomiting and Pain level controlled  Post-op Vital Signs: Reviewed and stable  Complications: No apparent anesthesia complications

## 2013-07-29 ENCOUNTER — Encounter (HOSPITAL_BASED_OUTPATIENT_CLINIC_OR_DEPARTMENT_OTHER): Payer: Self-pay | Admitting: Orthopedic Surgery

## 2013-07-29 NOTE — Op Note (Signed)
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  PROCEDURE: 1. Right gastrocnemius recession. 2. Right first, second and third tarsometatarsal joint arthrodeses. 3. AP, lateral, and oblique radiographs of the right foot.  SURGEON:  Wylene Simmer, MD.  ANESTHESIA:  General, regional.  ESTIMATED BLOOD LOSS:  Minimal.  TOURNIQUET TIME:  83 minutes at 220 mmHg.  COMPLICATIONS:  None apparent.  DISPOSITION:  Extubated, awake and stable to recovery.  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  She has failed nonoperative treatment including activity modification, oral anti- inflammatories, steroid injections, custom orthotics, and continues to have persistent limiting pain.  She presents now for operative treatment of this condition.  She understands the risks and benefits, the alternative treatment options and elects surgical treatment.  She specifically understands risks of bleeding, infection, nerve damage, blood clots, need for additional surgery, amputation, and death.  PROCEDURE IN DETAIL:  After preoperative consent was obtained and the correct operative site was identified, the patient was brought to the operating room and placed supine on the operating table.  General anesthesia was induced.  Preoperative antibiotics were administered. Surgical time-out was taken.  The right lower extremity was prepped and draped in standard sterile fashion with a tourniquet around the thigh. The  extremity was exsanguinated.  The tourniquet was inflated to 220 mmHg.  A longitudinal incision was then made over the medial aspect of the calf.  Sharp dissection was carried down through skin and subcutaneous tissue.  The medial superficial fascia was incised.  The plantaris tendon was identified and transected.  The gastrocnemius tendon was then identified and transected from medial to lateral under direct vision taking care to protect the sural nerve posteriorly.  After release of the gastrocnemius tendon, the ankle was dorsiflexed 30 degrees with the knee extended.  Wound was irrigated.  Inverted simple sutures of 3-0 Monocryl were used to close the subcutaneous tissue and a running 3-0 nylon was used to close the skin incision.  Attention was then turned to the dorsum of the foot where a longitudinal incision was made over the first tarsometatarsal joint.  Sharp dissection was carried down through skin and subcutaneous tissue.  The joint capsule was identified, it was incised and elevated medially and laterally exposing the first TMT joint.  A second parallel incision was then made over the third tarsometatarsal joint.  Sharp dissection was carried down through skin and subcutaneous tissue.  The extensor tendons were identified and mobilized medially and laterally.  They were protected throughout the case.  The third TMT joint was identified and opened.  It was cleaned of all dorsal osteophytes.  The second  TMT joint was also identified and the joint capsule was opened.  Again, osteophytes were cleared.  Lamina spreader was then used to open the 3 TMT joints.  An oscillating saw was used to resect the articular cartilage and subchondral bone for all 3 joints.  Once all the waste bone had been removed, the wound was irrigated copiously.  The 2.5-mm drill bit was then used to perforate both sides of the joint for all 3 joints.  Approximately, 1.5 mL of demineralized bone matrix  and cancellous chips were then packed into the joints.  The joints were reduced and provisionally held with K-wires.  AP and lateral radiographs showed appropriate reduction of all 3 tarsometatarsal joints.  A 4-mm partially threaded cannulated screw was then inserted from proximal to distal across the first TMT joint appropriately compressing the joint. A 3.5-mm fully-threaded solid screw with a low-profile head was then inserted from distal to proximal further stabilizing the joint.  A partially threaded cannulated screw was then inserted from proximal to distal across both the second and third tarsometatarsal joints.  The screws were inserted compressing both joints appropriately.  AP, oblique, and lateral radiographs confirmed appropriate position and length of all hardware and appropriate compression of the 3 TMT joints. An in-line fusion plate from the [employer] foot plate set was then selected.  It was fixed over the second TMT joint dorsolaterally with a nonlocking and locking screws distally and proximally.  The third TMT joint was then stabilized again in similar fashion with another locking inline compression plate and locking and nonlocking screws.  Final AP, lateral, and oblique radiographs showed appropriate position and length of all hardware and appropriate compression of the first 3 TMT joints. Wounds were irrigated copiously.  Inverted simple sutures of 3-0 Monocryl were used to close the subcutaneous tissues, and running 3-0 nylon sutures were used to close the skin incisions.  Sterile dressings were applied followed by a well-padded short-leg splint.  Tourniquet had been released at 1 hour and 23 minutes.  Hemostasis was achieved prior to closure of the wounds.  The patient was then awakened from anesthesia and transported to the recovery room in stable condition.  FOLLOWUP PLAN:  The patient will be nonweightbearing on the right lower extremity for the next 6-8  weeks.  She will follow up with me in 2 weeks for suture removal and conversion to a cast.  She will start on aspirin daily for DVT prophylaxis.  AP, lateral and oblique intraoperative radiographs of the right foot were obtained today.  These showed interval arthrodesis of the first, second and third tarsometatarsal joints, with appropriately positioned hardware.  No malalignment is noted.  No acute injuries noted.  RADIOGRAPHS:  AP, lateral and oblique radiographs of the right foot were obtained intra-operatively.  These show interval arthrodesis of the right 1st, 2nd and 3rd TMT joints.  No acute injury is noted.   Wylene Simmer, MD     JH/MEDQ  D:  07/28/2013  T:  07/29/2013  Job:  [id number]

## 2014-01-23 DIAGNOSIS — D171 Benign lipomatous neoplasm of skin and subcutaneous tissue of trunk: Secondary | ICD-10-CM | POA: Insufficient documentation

## 2014-04-19 DIAGNOSIS — F322 Major depressive disorder, single episode, severe without psychotic features: Secondary | ICD-10-CM | POA: Insufficient documentation

## 2014-08-02 DIAGNOSIS — N3281 Overactive bladder: Secondary | ICD-10-CM | POA: Insufficient documentation

## 2014-08-02 DIAGNOSIS — N8111 Cystocele, midline: Secondary | ICD-10-CM | POA: Insufficient documentation

## 2014-11-04 DIAGNOSIS — E669 Obesity, unspecified: Secondary | ICD-10-CM | POA: Insufficient documentation

## 2014-12-06 NOTE — Pre-Procedure Instructions (Signed)
    Joan Ray  12/06/2014      [id number], [place] [address] Phone: [phone] Fax: [place], [place] [address] Phone: [phone] Fax: [phone]    Your procedure is scheduled on Wednesday, December 13, 2014.  Report to [facility] Entrance "A" [address] at [employer] A.M.  Call this number if you have problems the morning of surgery:  [phone]   Remember:  Do not eat food or drink liquids after midnight Tuesday, December 12, 2014  Take these medicines the morning of surgery with A SIP OF WATER amlodipine ( Norvasc) ,  Certirizine (Zyrtec) ,  pregabalin (Lyrica) venlafaxine XR ( Effexor), if needed:rizatriptan (MAXALT) for migraine  Continue to take all your other medicines as you normally do until day of surgery and then follow above instructions.  STOP taking Phentermine,  Aspirin, Goody's, BC's, Aleve (Naproxen), Ibuprofen (Advil or Motrin), Fish Oil, Vitamins, Herbal Supplements such as Melatonin  or any substance that could thin your blood such as celecoxib (CELEBREX)  starting today.   Do not wear jewelry, make-up or nail polish.  Do not wear lotions, powders, or perfumes.  You may not wear deodorant.  Do not shave 48 hours prior to surgery.    Do not bring valuables to the hospital.  [facility] is not responsible for any belongings or valuables.  Contacts, dentures or bridgework may not be worn into surgery.  Leave your suitcase in the car.  After surgery it may be brought to your room.  For patients admitted to the hospital, discharge time will be determined by your treatment team.  Patients discharged the day of surgery will not be allowed to drive home.   Please use CHG soap the night before surgery and the day of surgery. CHG soap should be used atleast twice.  An attached form will explain how to use soap.  Please read over the following fact sheets that you were given. Pain Booklet, Coughing and Deep Breathing, MRSA Information and Surgical Site Infection Prevention

## 2014-12-07 ENCOUNTER — Encounter (HOSPITAL_COMMUNITY): Payer: Self-pay

## 2014-12-07 ENCOUNTER — Encounter (HOSPITAL_COMMUNITY)
Admission: RE | Admit: 2014-12-07 | Discharge: 2014-12-07 | Disposition: A | Discharge status: Home / Self Care | Payer: BLUE CROSS/BLUE SHIELD | Source: Ambulatory Visit | Attending: Orthopedic Surgery | Admitting: Orthopedic Surgery

## 2014-12-07 DIAGNOSIS — M1990 Unspecified osteoarthritis, unspecified site: Secondary | ICD-10-CM | POA: Diagnosis not present

## 2014-12-07 DIAGNOSIS — G35 Multiple sclerosis: Secondary | ICD-10-CM | POA: Diagnosis not present

## 2014-12-07 DIAGNOSIS — M5012 Cervical disc disorder with radiculopathy, mid-cervical region: Secondary | ICD-10-CM | POA: Diagnosis not present

## 2014-12-07 HISTORY — DX: Constipation, unspecified: K59.00

## 2014-12-07 HISTORY — DX: Pure hyperglyceridemia: E78.1

## 2014-12-07 LAB — BASIC METABOLIC PANEL
ANION GAP: 8 (ref 5–15)
BUN: 16 mg/dL (ref 6–20)
CALCIUM: 9.2 mg/dL (ref 8.9–10.3)
CHLORIDE: 106 mmol/L (ref 101–111)
CO2: 26 mmol/L (ref 22–32)
Creatinine, Ser: 0.79 mg/dL (ref 0.44–1.00)
GFR calc Af Amer: >60 mL/min (ref >60)
GFR calc non Af Amer: >60 mL/min (ref >60)
Glucose, Bld: 106 mg/dL — ABNORMAL HIGH (ref 65–99)
POTASSIUM: 4.3 mmol/L (ref 3.5–5.1)
Sodium: 140 mmol/L (ref 135–145)

## 2014-12-07 LAB — CBC
HCT: 37.9 % (ref 36.0–46.0)
Hemoglobin: 12.4 g/dL (ref 12.0–15.0)
MCH: 29.3 pg (ref 26.0–34.0)
MCHC: 32.7 g/dL (ref 30.0–36.0)
MCV: 89.6 fL (ref 78.0–100.0)
Platelets: 208 10*3/uL (ref 150–400)
RBC: 4.23 MIL/uL (ref 3.87–5.11)
RDW: 14.9 % (ref 11.5–15.5)
WBC: 5.3 10*3/uL (ref 4.0–10.5)

## 2014-12-07 LAB — SURGICAL PCR SCREEN
MRSA, PCR: NEGATIVE
Staphylococcus aureus: NEGATIVE

## 2014-12-07 NOTE — Progress Notes (Signed)
Pt denies SOB, chest pain, and being under the care of a cardiologist. Pt denies having a chest x ray within the last year. Pt stated that an EKG was done in April at PCP, Synthia Innocent, NP at [facility]; records requested. Dr. Rolena Infante made aware of  pt intolerance to Vancomycin and a reaction to an insect bite on pt left inner arm. Pt denies having a stress test, echo and cardiac cath.

## 2014-12-07 NOTE — Progress Notes (Signed)
   12/07/14 0843  OBSTRUCTIVE SLEEP APNEA  Have you ever been diagnosed with sleep apnea through a sleep study? No  Do you snore loudly (loud enough to be heard through closed doors)?  0  Do you often feel tired, fatigued, or sleepy during the daytime? 1  Has anyone observed you stop breathing during your sleep? 0  Do you have, or are you being treated for high blood pressure? 0  BMI more than 35 kg/m2? 1  Age over 55 years old? 1  Neck circumference greater than 40 cm/16 inches? 1  Gender: 0  Obstructive Sleep Apnea Score 4

## 2014-12-21 ENCOUNTER — Ambulatory Visit (HOSPITAL_COMMUNITY): Payer: BLUE CROSS/BLUE SHIELD | Admitting: Certified Registered Nurse Anesthetist

## 2014-12-21 ENCOUNTER — Ambulatory Visit (HOSPITAL_COMMUNITY): Payer: BLUE CROSS/BLUE SHIELD | Admitting: Emergency Medicine

## 2014-12-21 ENCOUNTER — Observation Stay (HOSPITAL_COMMUNITY): Payer: BLUE CROSS/BLUE SHIELD

## 2014-12-21 ENCOUNTER — Encounter (HOSPITAL_COMMUNITY): Payer: Self-pay | Admitting: Certified Registered Nurse Anesthetist

## 2014-12-21 ENCOUNTER — Ambulatory Visit (HOSPITAL_COMMUNITY): Payer: BLUE CROSS/BLUE SHIELD

## 2014-12-21 ENCOUNTER — Observation Stay (HOSPITAL_COMMUNITY)
Admission: RE | Admit: 2014-12-21 | Discharge: 2014-12-22 | Disposition: A | Discharge status: Home / Self Care | Payer: BLUE CROSS/BLUE SHIELD | Source: Ambulatory Visit | Attending: Orthopedic Surgery | Admitting: Orthopedic Surgery

## 2014-12-21 ENCOUNTER — Encounter (HOSPITAL_COMMUNITY)
Admission: RE | Disposition: A | Discharge status: Home / Self Care | Payer: Self-pay | Source: Ambulatory Visit | Attending: Orthopedic Surgery

## 2014-12-21 DIAGNOSIS — M1990 Unspecified osteoarthritis, unspecified site: Secondary | ICD-10-CM | POA: Diagnosis not present

## 2014-12-21 DIAGNOSIS — Z981 Arthrodesis status: Secondary | ICD-10-CM

## 2014-12-21 DIAGNOSIS — M542 Cervicalgia: Secondary | ICD-10-CM | POA: Diagnosis present

## 2014-12-21 DIAGNOSIS — Z419 Encounter for procedure for purposes other than remedying health state, unspecified: Secondary | ICD-10-CM

## 2014-12-21 DIAGNOSIS — G35 Multiple sclerosis: Secondary | ICD-10-CM | POA: Insufficient documentation

## 2014-12-21 DIAGNOSIS — M5012 Cervical disc disorder with radiculopathy, mid-cervical region: Principal | ICD-10-CM | POA: Insufficient documentation

## 2014-12-21 HISTORY — PX: ANTERIOR CERVICAL DECOMP/DISCECTOMY FUSION: SHX1161

## 2014-12-21 SURGERY — ANTERIOR CERVICAL DECOMPRESSION/DISCECTOMY FUSION 2 LEVELS
Anesthesia: General | Site: Neck

## 2014-12-21 MED ORDER — FUROSEMIDE 20 MG PO TABS
20.0000 mg | ORAL_TABLET | Freq: Every day | ORAL | Status: DC | PRN
  Filled 2014-12-21: qty 1

## 2014-12-21 MED ORDER — DIPHENHYDRAMINE HCL 50 MG/ML IJ SOLN
INTRAMUSCULAR | Status: DC | PRN
  Administered 2014-12-21: 25 mg via INTRAVENOUS

## 2014-12-21 MED ORDER — PROPOFOL 10 MG/ML IV BOLUS
INTRAVENOUS | Status: AC
  Filled 2014-12-21: qty 20

## 2014-12-21 MED ORDER — MORPHINE SULFATE 4 MG/ML IJ SOLN
INTRAMUSCULAR | Status: AC
  Administered 2014-12-21: 4 mg
  Filled 2014-12-21: qty 2

## 2014-12-21 MED ORDER — ONDANSETRON HCL 4 MG/2ML IJ SOLN
INTRAMUSCULAR | Status: AC
  Filled 2014-12-21: qty 2

## 2014-12-21 MED ORDER — POTASSIUM CHLORIDE CRYS ER 10 MEQ PO TBCR
10.0000 meq | EXTENDED_RELEASE_TABLET | Freq: Every day | ORAL | Status: DC | PRN
  Filled 2014-12-21: qty 1

## 2014-12-21 MED ORDER — SODIUM CHLORIDE 0.9 % IJ SOLN: 3.0000 mL | INTRAMUSCULAR | Status: DC | PRN

## 2014-12-21 MED ORDER — FENTANYL CITRATE (PF) 100 MCG/2ML IJ SOLN
INTRAMUSCULAR | Status: DC | PRN
  Administered 2014-12-21: 100 ug via INTRAVENOUS
  Administered 2014-12-21: 150 ug via INTRAVENOUS

## 2014-12-21 MED ORDER — 0.9 % SODIUM CHLORIDE (POUR BTL) OPTIME
TOPICAL | Status: DC | PRN
  Administered 2014-12-21 (×2): 1000 mL

## 2014-12-21 MED ORDER — VITAMIN D (ERGOCALCIFEROL) 1.25 MG (50000 UNIT) PO CAPS
50000.0000 [IU] | ORAL_CAPSULE | ORAL | Status: DC
  Filled 2014-12-21: qty 1

## 2014-12-21 MED ORDER — DIPHENHYDRAMINE HCL 25 MG PO CAPS
50.0000 mg | ORAL_CAPSULE | Freq: Four times a day (QID) | ORAL | Status: DC | PRN
Start: 2014-12-21 — End: 2014-12-22
  Administered 2014-12-22: 50 mg via ORAL
  Filled 2014-12-21: qty 2

## 2014-12-21 MED ORDER — MORPHINE SULFATE 2 MG/ML IJ SOLN: 1.0000 mg | INTRAMUSCULAR | Status: DC | PRN

## 2014-12-21 MED ORDER — PROPOFOL 10 MG/ML IV BOLUS
INTRAVENOUS | Status: DC | PRN
  Administered 2014-12-21: 200 mg via INTRAVENOUS

## 2014-12-21 MED ORDER — THROMBIN 20000 UNITS EX SOLR
CUTANEOUS | Status: AC
  Filled 2014-12-21: qty 20000

## 2014-12-21 MED ORDER — VENLAFAXINE HCL ER 75 MG PO CP24
225.0000 mg | ORAL_CAPSULE | Freq: Every day | ORAL | Status: DC
  Administered 2014-12-22: 225 mg via ORAL
  Filled 2014-12-21 (×3): qty 1

## 2014-12-21 MED ORDER — CLONAZEPAM 0.5 MG PO TABS
0.5000 mg | ORAL_TABLET | Freq: Every evening | ORAL | Status: DC | PRN
  Administered 2014-12-22: 0.5 mg via ORAL
  Filled 2014-12-21: qty 1

## 2014-12-21 MED ORDER — PROMETHAZINE HCL 25 MG/ML IJ SOLN: 6.2500 mg | INTRAMUSCULAR | Status: DC | PRN

## 2014-12-21 MED ORDER — DIPHENHYDRAMINE HCL 50 MG/ML IJ SOLN: 50.0000 mg | Freq: Four times a day (QID) | INTRAMUSCULAR | Status: DC | PRN

## 2014-12-21 MED ORDER — MIDAZOLAM HCL 2 MG/2ML IJ SOLN
INTRAMUSCULAR | Status: AC
  Filled 2014-12-21: qty 2

## 2014-12-21 MED ORDER — ONDANSETRON HCL 4 MG/2ML IJ SOLN: 4.0000 mg | INTRAMUSCULAR | Status: DC | PRN

## 2014-12-21 MED ORDER — LACTATED RINGERS IV SOLN
INTRAVENOUS | Status: DC | PRN
  Administered 2014-12-21: 07:00:00 via INTRAVENOUS

## 2014-12-21 MED ORDER — FENTANYL CITRATE (PF) 250 MCG/5ML IJ SOLN
INTRAMUSCULAR | Status: AC
  Filled 2014-12-21: qty 5

## 2014-12-21 MED ORDER — ROCURONIUM BROMIDE 100 MG/10ML IV SOLN
INTRAVENOUS | Status: DC | PRN
  Administered 2014-12-21 (×2): 10 mg via INTRAVENOUS
  Administered 2014-12-21: 30 mg via INTRAVENOUS
  Administered 2014-12-21: 10 mg via INTRAVENOUS
  Administered 2014-12-21: 50 mg via INTRAVENOUS

## 2014-12-21 MED ORDER — AMLODIPINE BESYLATE 5 MG PO TABS
5.0000 mg | ORAL_TABLET | Freq: Every day | ORAL | Status: DC
  Filled 2014-12-21 (×2): qty 1

## 2014-12-21 MED ORDER — MORPHINE SULFATE 2 MG/ML IJ SOLN
1.0000 mg | INTRAMUSCULAR | Status: DC | PRN
  Administered 2014-12-21 (×2): 2 mg via INTRAVENOUS

## 2014-12-21 MED ORDER — ACETAMINOPHEN 10 MG/ML IV SOLN
1000.0000 mg | Freq: Four times a day (QID) | INTRAVENOUS | Status: DC
  Administered 2014-12-21 – 2014-12-22 (×3): 1000 mg via INTRAVENOUS
  Filled 2014-12-21 (×4): qty 100

## 2014-12-21 MED ORDER — DEXTROSE 5 % IV SOLN
500.0000 mg | Freq: Four times a day (QID) | INTRAVENOUS | Status: DC | PRN
  Filled 2014-12-21: qty 5

## 2014-12-21 MED ORDER — PHENOL 1.4 % MT LIQD: 1.0000 | OROMUCOSAL | Status: DC | PRN

## 2014-12-21 MED ORDER — LIDOCAINE HCL (CARDIAC) 20 MG/ML IV SOLN
INTRAVENOUS | Status: AC
  Filled 2014-12-21: qty 5

## 2014-12-21 MED ORDER — THROMBIN 20000 UNITS EX SOLR
OROMUCOSAL | Status: DC | PRN
  Administered 2014-12-21: 08:00:00 via TOPICAL

## 2014-12-21 MED ORDER — MENTHOL 3 MG MT LOZG: 1.0000 | LOZENGE | OROMUCOSAL | Status: DC | PRN

## 2014-12-21 MED ORDER — NEOSTIGMINE METHYLSULFATE 10 MG/10ML IV SOLN
INTRAVENOUS | Status: DC | PRN
  Administered 2014-12-21: 5 mg via INTRAVENOUS

## 2014-12-21 MED ORDER — CLINDAMYCIN PHOSPHATE 900 MG/50ML IV SOLN
900.0000 mg | Freq: Three times a day (TID) | INTRAVENOUS | Status: AC
  Administered 2014-12-21 – 2014-12-22 (×3): 900 mg via INTRAVENOUS
  Filled 2014-12-21 (×3): qty 50

## 2014-12-21 MED ORDER — METHOCARBAMOL 500 MG PO TABS
ORAL_TABLET | ORAL | Status: AC
  Filled 2014-12-21: qty 1

## 2014-12-21 MED ORDER — ACETAMINOPHEN 10 MG/ML IV SOLN
INTRAVENOUS | Status: DC | PRN
  Administered 2014-12-21: 1000 mg via INTRAVENOUS

## 2014-12-21 MED ORDER — MORPHINE SULFATE 4 MG/ML IJ SOLN
4.0000 mg | Freq: Once | INTRAMUSCULAR | Status: AC
  Administered 2014-12-21: 4 mg via INTRAVENOUS

## 2014-12-21 MED ORDER — ONDANSETRON HCL 4 MG/2ML IJ SOLN
INTRAMUSCULAR | Status: DC | PRN
  Administered 2014-12-21: 4 mg via INTRAVENOUS

## 2014-12-21 MED ORDER — MORPHINE SULFATE 4 MG/ML IJ SOLN
INTRAMUSCULAR | Status: AC
  Administered 2014-12-21: 4 mg
  Filled 2014-12-21: qty 1

## 2014-12-21 MED ORDER — MORPHINE SULFATE 4 MG/ML IJ SOLN
INTRAMUSCULAR | Status: AC
  Administered 2014-12-21: 4 mg via INTRAVENOUS
  Filled 2014-12-21: qty 1

## 2014-12-21 MED ORDER — BUPIVACAINE-EPINEPHRINE 0.25% -1:200000 IJ SOLN
INTRAMUSCULAR | Status: DC | PRN
  Administered 2014-12-21: 4 mL

## 2014-12-21 MED ORDER — EPHEDRINE SULFATE 50 MG/ML IJ SOLN
INTRAMUSCULAR | Status: DC | PRN
  Administered 2014-12-21: 10 mg via INTRAVENOUS
  Administered 2014-12-21: 15 mg via INTRAVENOUS
  Administered 2014-12-21 (×2): 10 mg via INTRAVENOUS
  Administered 2014-12-21: 5 mg via INTRAVENOUS

## 2014-12-21 MED ORDER — OXYCODONE HCL 5 MG/5ML PO SOLN: 5.0000 mg | Freq: Once | ORAL | Status: DC | PRN

## 2014-12-21 MED ORDER — ACETAMINOPHEN 325 MG PO TABS: 325.0000 mg | ORAL_TABLET | ORAL | Status: DC | PRN

## 2014-12-21 MED ORDER — GLYCOPYRROLATE 0.2 MG/ML IJ SOLN
INTRAMUSCULAR | Status: DC | PRN
  Administered 2014-12-21: .8 mg via INTRAVENOUS

## 2014-12-21 MED ORDER — LACTATED RINGERS IV SOLN: INTRAVENOUS | Status: DC

## 2014-12-21 MED ORDER — CLINDAMYCIN PHOSPHATE 900 MG/50ML IV SOLN
INTRAVENOUS | Status: DC | PRN
  Administered 2014-12-21: 900 mg via INTRAVENOUS

## 2014-12-21 MED ORDER — LIDOCAINE HCL (CARDIAC) 20 MG/ML IV SOLN
INTRAVENOUS | Status: DC | PRN
  Administered 2014-12-21: 80 mg via INTRAVENOUS

## 2014-12-21 MED ORDER — OXYBUTYNIN CHLORIDE ER 15 MG PO TB24
15.0000 mg | ORAL_TABLET | Freq: Every day | ORAL | Status: DC
  Administered 2014-12-21: 15 mg via ORAL
  Filled 2014-12-21 (×2): qty 1

## 2014-12-21 MED ORDER — METHYLPREDNISOLONE SODIUM SUCC 125 MG IJ SOLR
INTRAMUSCULAR | Status: DC | PRN
  Administered 2014-12-21: 125 mg via INTRAVENOUS

## 2014-12-21 MED ORDER — BUPIVACAINE-EPINEPHRINE (PF) 0.25% -1:200000 IJ SOLN
INTRAMUSCULAR | Status: AC
  Filled 2014-12-21: qty 30

## 2014-12-21 MED ORDER — SODIUM CHLORIDE 0.9 % IJ SOLN
3.0000 mL | Freq: Two times a day (BID) | INTRAMUSCULAR | Status: DC
  Administered 2014-12-21: 3 mL via INTRAVENOUS

## 2014-12-21 MED ORDER — ROCURONIUM BROMIDE 50 MG/5ML IV SOLN
INTRAVENOUS | Status: AC
  Filled 2014-12-21: qty 1

## 2014-12-21 MED ORDER — VANCOMYCIN HCL 10 G IV SOLR: 1500.0000 mg | INTRAVENOUS | Status: DC

## 2014-12-21 MED ORDER — OXYCODONE HCL 5 MG PO TABS
ORAL_TABLET | ORAL | Status: AC
  Administered 2014-12-21: 10 mg via ORAL
  Filled 2014-12-21: qty 2

## 2014-12-21 MED ORDER — METHOCARBAMOL 500 MG PO TABS
500.0000 mg | ORAL_TABLET | Freq: Four times a day (QID) | ORAL | Status: DC | PRN
  Administered 2014-12-21: 500 mg via ORAL
  Filled 2014-12-21 (×2): qty 1

## 2014-12-21 MED ORDER — MIDAZOLAM HCL 5 MG/5ML IJ SOLN
INTRAMUSCULAR | Status: DC | PRN
  Administered 2014-12-21: 2 mg via INTRAVENOUS

## 2014-12-21 MED ORDER — OXYCODONE HCL 5 MG PO TABS: 5.0000 mg | ORAL_TABLET | Freq: Once | ORAL | Status: DC | PRN

## 2014-12-21 MED ORDER — METHOCARBAMOL 500 MG PO TABS
ORAL_TABLET | ORAL | Status: AC
  Administered 2014-12-21: 500 mg via ORAL
  Filled 2014-12-21: qty 1

## 2014-12-21 MED ORDER — ACETAMINOPHEN 160 MG/5ML PO SOLN
325.0000 mg | ORAL | Status: DC | PRN
  Filled 2014-12-21: qty 20.3

## 2014-12-21 MED ORDER — PREGABALIN 50 MG PO CAPS
100.0000 mg | ORAL_CAPSULE | Freq: Two times a day (BID) | ORAL | Status: DC
  Administered 2014-12-21: 100 mg via ORAL
  Filled 2014-12-21: qty 2

## 2014-12-21 MED ORDER — VENLAFAXINE HCL ER 75 MG PO CP24
225.0000 mg | ORAL_CAPSULE | Freq: Every day | ORAL | Status: DC
  Filled 2014-12-21: qty 1

## 2014-12-21 MED ORDER — OXYCODONE HCL 5 MG PO TABS
10.0000 mg | ORAL_TABLET | ORAL | Status: DC | PRN
  Administered 2014-12-21 – 2014-12-22 (×4): 10 mg via ORAL
  Filled 2014-12-21 (×3): qty 2

## 2014-12-21 SURGICAL SUPPLY — 64 items
BLADE SURG ROTATE 9660 (MISCELLANEOUS) IMPLANT
BUR EGG ELITE 4.0 (BURR) IMPLANT
BUR EGG ELITE 4.0MM (BURR)
BUR MATCHSTICK NEURO 3.0 LAGG (BURR) IMPLANT
CANISTER SUCTION 2500CC (MISCELLANEOUS) ×3 IMPLANT
CLOSURE STERI-STRIP 1/2X4 (GAUZE/BANDAGES/DRESSINGS) ×1
CLSR STERI-STRIP ANTIMIC 1/2X4 (GAUZE/BANDAGES/DRESSINGS) ×2 IMPLANT
CORDS BIPOLAR (ELECTRODE) ×3 IMPLANT
COVER SURGICAL LIGHT HANDLE (MISCELLANEOUS) ×6 IMPLANT
CRADLE DONUT ADULT HEAD (MISCELLANEOUS) ×3 IMPLANT
DEVICE ENDSKLTN TC MED 8MM (Orthopedic Implant) ×2 IMPLANT
DRAPE C-ARM 42X72 X-RAY (DRAPES) ×3 IMPLANT
DRAPE POUCH INSTRU U-SHP 10X18 (DRAPES) ×3 IMPLANT
DRAPE SURG 17X23 STRL (DRAPES) ×3 IMPLANT
DRAPE U-SHAPE 47X51 STRL (DRAPES) ×3 IMPLANT
DRSG MEPILEX BORDER 4X4 (GAUZE/BANDAGES/DRESSINGS) ×3 IMPLANT
DURAPREP 26ML APPLICATOR (WOUND CARE) ×3 IMPLANT
ELECT COATED BLADE 2.86 ST (ELECTRODE) ×3 IMPLANT
ELECT PENCIL ROCKER SW 15FT (MISCELLANEOUS) ×3 IMPLANT
ELECT REM PT RETURN 9FT ADLT (ELECTROSURGICAL) ×3
ELECTRODE REM PT RTRN 9FT ADLT (ELECTROSURGICAL) ×1 IMPLANT
ENDOSKELTON TC IMPLANT 8MM MED (Orthopedic Implant) ×6 IMPLANT
GLOVE BIOGEL PI IND STRL 8 (GLOVE) ×1 IMPLANT
GLOVE BIOGEL PI IND STRL 8.5 (GLOVE) ×1 IMPLANT
GLOVE BIOGEL PI INDICATOR 8 (GLOVE) ×2
GLOVE BIOGEL PI INDICATOR 8.5 (GLOVE) ×2
GLOVE ORTHO TXT STRL SZ7.5 (GLOVE) ×3 IMPLANT
GLOVE SS BIOGEL STRL SZ 8.5 (GLOVE) ×1 IMPLANT
GLOVE SUPERSENSE BIOGEL SZ 8.5 (GLOVE) ×2
GOWN STRL REUS W/ TWL XL LVL3 (GOWN DISPOSABLE) ×2 IMPLANT
GOWN STRL REUS W/TWL 2XL LVL3 (GOWN DISPOSABLE) ×6 IMPLANT
GOWN STRL REUS W/TWL XL LVL3 (GOWN DISPOSABLE) ×4
KIT BASIN OR (CUSTOM PROCEDURE TRAY) ×3 IMPLANT
KIT ROOM TURNOVER OR (KITS) ×3 IMPLANT
NEEDLE SPNL 18GX3.5 QUINCKE PK (NEEDLE) ×3 IMPLANT
NS IRRIG 1000ML POUR BTL (IV SOLUTION) ×3 IMPLANT
PACK ORTHO CERVICAL (CUSTOM PROCEDURE TRAY) ×3 IMPLANT
PACK UNIVERSAL I (CUSTOM PROCEDURE TRAY) ×3 IMPLANT
PAD ARMBOARD 7.5X6 YLW CONV (MISCELLANEOUS) ×6 IMPLANT
PATTIES SURGICAL .25X.25 (GAUZE/BANDAGES/DRESSINGS) ×3 IMPLANT
PIN DISTRACTION 14 (PIN) ×3 IMPLANT
PIN RETAINER PRODISC 14 MM (PIN) ×3 IMPLANT
PLATE SKYLINE TWO LEVEL 32MM (Plate) ×3 IMPLANT
PUTTY BONE DBX 5CC MIX (Putty) ×3 IMPLANT
RESTRAINT LIMB HOLDER UNIV (RESTRAINTS) ×3 IMPLANT
SCREW SKYLINE 14MM SD-VA (Screw) ×12 IMPLANT
SCREW SKYLINE 16MM (Screw) ×6 IMPLANT
SPONGE INTESTINAL PEANUT (DISPOSABLE) ×3 IMPLANT
SPONGE LAP 4X18 X RAY DECT (DISPOSABLE) ×6 IMPLANT
SPONGE SURGIFOAM ABS GEL 100 (HEMOSTASIS) ×3 IMPLANT
SURGIFLO TRUKIT (HEMOSTASIS) IMPLANT
SUT BONE WAX W31G (SUTURE) ×3 IMPLANT
SUT MON AB 3-0 SH 27 (SUTURE) ×2
SUT MON AB 3-0 SH27 (SUTURE) ×1 IMPLANT
SUT SILK 2 0 (SUTURE)
SUT SILK 2-0 18XBRD TIE 12 (SUTURE) IMPLANT
SUT VIC AB 2-0 CT1 18 (SUTURE) ×3 IMPLANT
SYR BULB IRRIGATION 50ML (SYRINGE) ×3 IMPLANT
SYR CONTROL 10ML LL (SYRINGE) ×3 IMPLANT
TAPE CLOTH 4X10 WHT NS (GAUZE/BANDAGES/DRESSINGS) ×3 IMPLANT
TAPE UMBILICAL COTTON 1/8X30 (MISCELLANEOUS) ×3 IMPLANT
TOWEL OR 17X24 6PK STRL BLUE (TOWEL DISPOSABLE) ×3 IMPLANT
TOWEL OR 17X26 10 PK STRL BLUE (TOWEL DISPOSABLE) ×3 IMPLANT
WATER STERILE IRR 1000ML POUR (IV SOLUTION) ×3 IMPLANT

## 2014-12-21 NOTE — Plan of Care (Signed)
Problem: Consults Goal: Diagnosis - Spinal Surgery Outcome: Completed/Met Date Met:  12/21/14 Cervical Spine Fusion

## 2014-12-21 NOTE — Transfer of Care (Signed)
Immediate Anesthesia Transfer of Care Note  Patient: Joan Ray  Procedure(s) Performed: Procedure(s): ANTERIOR CERVICAL DECOMPRESSION/DISCECTOMY FUSION 2 LEVELS C5-7 (N/A)  Patient Location: PACU  Anesthesia Type:General  Level of Consciousness: awake, alert  and oriented  Airway & Oxygen Therapy: Patient Spontanous Breathing and Patient connected to nasal cannula oxygen  Post-op Assessment: Report given to RN and Post -op Vital signs reviewed and stable  Post vital signs: Reviewed and stable  Last Vitals:  Filed Vitals:   12/21/14 0603  BP: 130/76  Pulse: 73  Temp: 36.9 C  Resp: 16    Complications: No apparent anesthesia complications

## 2014-12-21 NOTE — Brief Op Note (Signed)
12/21/2014  10:33 AM  PATIENT:  Acquanetta Belling  54 y.o. female  PRE-OPERATIVE DIAGNOSIS:  CERVICAL SPONDYLOTIC RADICULOPATHY  POST-OPERATIVE DIAGNOSIS:  CERVICAL SPONDYLOTIC RADICULOPATHY  PROCEDURE:  Procedure(s): ANTERIOR CERVICAL DECOMPRESSION/DISCECTOMY FUSION 2 LEVELS C5-7 (N/A)  SURGEON:  Surgeon(s) and Role:    * Melina Schools, MD - Primary  PHYSICIAN ASSISTANT:   ASSISTANTS: none   ANESTHESIA:   general  EBL:  Total I/O In: -  Out: 25 [Blood:25]  BLOOD ADMINISTERED:none  DRAINS: none   LOCAL MEDICATIONS USED:  MARCAINE     SPECIMEN:  No Specimen  DISPOSITION OF SPECIMEN:  N/A  COUNTS:  YES  TOURNIQUET:  * No tourniquets in log *  DICTATION: .Other Dictation: Dictation Number [id number]  PLAN OF CARE: Admit for overnight observation  PATIENT DISPOSITION:  PACU - hemodynamically stable.

## 2014-12-21 NOTE — Anesthesia Postprocedure Evaluation (Signed)
Anesthesia Post Note  Patient: Joan Ray  Procedure(s) Performed: Procedure(s) (LRB): ANTERIOR CERVICAL DECOMPRESSION/DISCECTOMY FUSION 2 LEVELS C5-7 (N/A)  Anesthesia type: General  Patient location: PACU  Post pain: Pain level controlled and Adequate analgesia  Post assessment: Post-op Vital signs reviewed, Patient's Cardiovascular Status Stable, Respiratory Function Stable, Patent Airway and Pain level controlled  Last Vitals:  Filed Vitals:   12/21/14 1230  BP: 117/71  Pulse: 93  Temp:   Resp:     Post vital signs: Reviewed and stable  Level of consciousness: awake, alert  and oriented  Complications: No apparent anesthesia complications

## 2014-12-21 NOTE — H&P (Addendum)
Ray,[name], NP Chief Complaint: Neck and left arm pain History: Patient with long standing hx of neck and radicular arm pain.  Imaging studies confirm 2 level cervical spondylotic changes C5/6 C 6/7.  Clinical exam consistent with radicular pain and numbness in the C6 and C7 distribution.  Plan on 2 level ACDF C5-7  Past Medical History  Diagnosis Date  . Complication of anesthesia   . PONV (postoperative nausea and vomiting)   . GERD (gastroesophageal reflux disease)   . Headache(784.0)     migraines  . 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 Arthritis     osteo   . Anemia     h/o of   Last infusion of iron  2008  . Anxiety   . Depression     d/t decreased activity  . Restless leg syndrome   . MS (multiple sclerosis)   . Wears glasses   . Wears glasses   . High triglycerides   . Constipation     Allergies  Allergen Reactions  . Betadine [Povidone Iodine]     Blisters  . Codeine Itching    Rash  . Darvocet [Propoxyphene N-Acetaminophen] Nausea And Vomiting  . Diazepam     Increases agitation  . Dilaudid [Hydromorphone Hcl] Itching  . Doxycycline     Facial flushing  . Keppra [Levetiracetam]     Shaking, nervousness, palpations and flushing  . Lamictal [Lamotrigine]     Causes tongue and mouth to tingle  . Lipitor [Atorvastatin]     Severe muscle and joint pain / ms relapse  . Lyrica [Pregabalin] Other (See Comments)    Doses over 300mg  decrease memory  . Neurontin [Gabapentin]     Impaired cognition, slower motor skills (intolerant)  . Other     EDTA causes blisters  . Pravastatin Other (See Comments)    Severe joint pain MS relapse  . Sertraline Other (See Comments)    Increased nervousness  . Topamax [Topiramate]     Confusion  . Zonisamide     Back pain, headaches, insomnia, chest pressure  . Cephalosporins Rash  . Erythromycin Itching and Rash  . Keflex [Cephalexin] Rash  . Vancomycin Rash    Pt stated, " I can't tolerate  Vancomycin I get a red rash."    No current facility-administered medications on file prior to encounter.   Current Outpatient Prescriptions on File Prior to Encounter  Medication Sig Dispense Refill  . amLODipine (NORVASC) 10 MG tablet Take 5 mg by mouth daily.    . cetirizine (ZYRTEC) 10 MG tablet Take 10 mg by mouth daily.    Marland Kitchen esomeprazole (NEXIUM) 40 MG capsule Take 40 mg by mouth at bedtime.    . fenofibrate (TRICOR) 145 MG tablet Take 145 mg by mouth at bedtime.     . natalizumab (TYSABRI) 300 MG/15ML injection Inject 300 mg into the vein every 30 (thirty) days. Takes for MS    . phentermine 37.5 MG capsule Take 37.5 mg by mouth daily before breakfast.     . rizatriptan (MAXALT) 10 MG tablet Take 10 mg by mouth every 2 (two) hours as needed for migraine. May repeat in 2 hours if needed      Physical Exam: Filed Vitals:   12/21/14 0603  BP: 130/76  Pulse: 73  Temp: 98.4 F (36.9 C)  Resp: 16  A+O X3 No sob/cp abd soft/nt  No incontinence of b/b Ambulating - nl gait  Neg babinski/hoffman  Symmetrical 1+  DTR's throughout Radicular left arm pain - numbness in C6 and 7 distribution No focal motor deficits   Image: No results found.  A/P:  Patient with significant neck and radicular arm pain.   Unresponsive to non-operative management.   Plan on ACDF C5-7.  Reviewed all risks and benefits with patient and husband. All questions addressed.  At this point in time, we have done a selective nerve root block with very limited positive results in terms of time. We have done physical therapy for the neck. We have done medications for the neck, activity modification for the neck, and this is getting progressively worse over the last 7 months. Despite appropriate conservative management, she still has debilitating pain. Her clinical x-rays show loss of cervical lordosis with sagittal imbalance, mainly in the mid to lower cervical spine. There is collapse of the C5-6 and C6-7 disc  spaces. Clinically, she has predominantly left C6 radiculopathy; however, I am somewhat concerned about the adjacent C6-7 space. My concern is, if I just address the C5-6 level, the likelihood of developing adjacent disease above an already compromised level is somewhat higher. We have discussed this option, but she would rather proceed with doing both levels, and in this case, I think it will be easier to restore her sagittal balance by doing both levels. We reviewed the risks, which include infection, bleeding, nerve damage, death, stroke, paralysis, failure to heal, need for further surgery, ongoing or worse pain, loss of bowel or bladder control, throat pain, swallowing difficulties, hoarseness in the voice, nonunion, hardware failure and need for posterior cervical decompression and/or instrumented fusion. All of her questions were addressed. She will contact me after she sees Dr. Doran Durand concerning the timing for the removal of her screws so that we could set up the appropriate time to do the cervical operation. We will get preoperative medical clearance as we did before her lumbar spine surgery.

## 2014-12-21 NOTE — Anesthesia Preprocedure Evaluation (Signed)
Anesthesia Evaluation  Patient identified by MRN, date of birth, ID band Patient awake    Reviewed: Allergy & Precautions, NPO status , Patient's Chart, lab work & pertinent test results  History of Anesthesia Complications (+) PONV and history of anesthetic complications  Airway Mallampati: II  TM Distance: >3 FB Neck ROM: Full    Dental  (+) Teeth Intact   Pulmonary neg pulmonary ROS,  breath sounds clear to auscultation        Cardiovascular negative cardio ROS  Rhythm:Regular     Neuro/Psych  Headaches, PSYCHIATRIC DISORDERS Anxiety Depression Neck pain with bilateral numbness and tingling of UE w/o changes due to neck movement  Neuromuscular disease    GI/Hepatic Neg liver ROS, GERD-  Medicated and Poorly Controlled,  Endo/Other  neg diabetesMorbid obesity  Renal/GU negative Renal ROS     Musculoskeletal  (+) Arthritis -,   Abdominal   Peds  Hematology negative hematology ROS (+)   Anesthesia Other Findings   Reproductive/Obstetrics                             Anesthesia Physical Anesthesia Plan  ASA: III  Anesthesia Plan: General   Post-op Pain Management:    Induction: Intravenous  Airway Management Planned: Oral ETT  Additional Equipment: None  Intra-op Plan:   Post-operative Plan: Extubation in OR  Informed Consent: I have reviewed the patients History and Physical, chart, labs and discussed the procedure including the risks, benefits and alternatives for the proposed anesthesia with the patient or authorized representative who has indicated his/her understanding and acceptance.   Dental advisory given  Plan Discussed with: CRNA and Surgeon  Anesthesia Plan Comments:         Anesthesia Quick Evaluation

## 2014-12-21 NOTE — Anesthesia Procedure Notes (Signed)
Procedure Name: Intubation Performed by: Gean Maidens Pre-anesthesia Checklist: Patient identified, Emergency Drugs available, Suction available, Timeout performed and Patient being monitored Patient Re-evaluated:Patient Re-evaluated prior to inductionOxygen Delivery Method: Circle system utilized Preoxygenation: Pre-oxygenation with 100% oxygen Intubation Type: IV induction Ventilation: Mask ventilation without difficulty Laryngoscope Size: Mac and 4 Tube type: Oral Number of attempts: 1 Airway Equipment and Method: Stylet Placement Confirmation: ETT inserted through vocal cords under direct vision,  positive ETCO2,  CO2 detector and breath sounds checked- equal and bilateral Secured at: 22 cm Tube secured with: Tape Dental Injury: Teeth and Oropharynx as per pre-operative assessment

## 2014-12-22 ENCOUNTER — Encounter (HOSPITAL_COMMUNITY): Payer: Self-pay | Admitting: Orthopedic Surgery

## 2014-12-22 DIAGNOSIS — M5012 Cervical disc disorder with radiculopathy, mid-cervical region: Secondary | ICD-10-CM | POA: Diagnosis not present

## 2014-12-22 MED ORDER — METHOCARBAMOL 500 MG PO TABS
500.0000 mg | ORAL_TABLET | Freq: Three times a day (TID) | ORAL | Status: DC | PRN
Start: 2014-12-22 — End: 2015-08-29

## 2014-12-22 MED ORDER — DOCUSATE SODIUM 100 MG PO CAPS: 100.0000 mg | ORAL_CAPSULE | Freq: Three times a day (TID) | ORAL | Status: DC | PRN

## 2014-12-22 MED ORDER — OXYCODONE-ACETAMINOPHEN 10-325 MG PO TABS: 1.0000 | ORAL_TABLET | ORAL | Status: DC | PRN

## 2014-12-22 MED ORDER — ONDANSETRON HCL 4 MG PO TABS: 4.0000 mg | ORAL_TABLET | Freq: Three times a day (TID) | ORAL | Status: DC | PRN

## 2014-12-22 MED ORDER — POLYETHYLENE GLYCOL 3350 17 GM/SCOOP PO POWD: 17.0000 g | Freq: Every day | ORAL | Status: DC

## 2014-12-22 NOTE — Progress Notes (Signed)
Patient alert and oriented, mae's well, voiding adequate amount of urine, swallowing without difficulty, no c/o pain. Patient discharged home with family. Script and discharged instructions given to patient. Patient and family stated understanding of d/c instructions given and has an appointment with MD. 

## 2014-12-22 NOTE — Evaluation (Signed)
Physical Therapy Evaluation Patient Details Name: Joan Ray MRN: [id number] DOB: 16-Sep-1960 Today's Date: 12/22/2014   History of Present Illness  pt presents with C5-7 ACDF.  pt with hx of MS and previous back surgery.    Clinical Impression  Pt moving well and eager for D/C to home today.  Pt will have A from family at D/C and no further acute PT needs at this time.  Will sign off.      Follow Up Recommendations No PT follow up;Supervision - Intermittent    Equipment Recommendations  None recommended by PT    Recommendations for Other Services       Precautions / Restrictions Precautions Precautions: Cervical Required Braces or Orthoses: Cervical Brace Cervical Brace: Hard collar;Other (comment) (On when not in bed.) Restrictions Weight Bearing Restrictions: No      Mobility  Bed Mobility Overal bed mobility: Modified Independent                Transfers Overall transfer level: Modified independent Equipment used: None                Ambulation/Gait Ambulation/Gait assistance: Modified independent (Device/Increase time) Ambulation Distance (Feet): 300 Feet Assistive device: None Gait Pattern/deviations: Step-through pattern;Decreased stride length     General Gait Details: pt moves slowly, but no deficits noted.    Stairs            Wheelchair Mobility    Modified Rankin (Stroke Patients Only)       Balance Overall balance assessment: No apparent balance deficits (not formally assessed)                                           Pertinent Vitals/Pain Pain Assessment: 0-10 Pain Score: 3  Pain Location: Throat and neck Pain Descriptors / Indicators: Sore Pain Intervention(s): Monitored during session;Premedicated before session;Repositioned    Home Living Family/patient expects to be discharged to:: Private residence Living Arrangements: Spouse/significant other Available Help at Discharge: Family;Available  24 hours/day (Initially) Type of Home: House Home Access: Stairs to enter Entrance Stairs-Rails: Right Entrance Stairs-Number of Steps: 4 Home Layout: One level Home Equipment: Walker - 2 wheels      Prior Function Level of Independence: Independent               Hand Dominance        Extremity/Trunk Assessment   Upper Extremity Assessment: Overall WFL for tasks assessed           Lower Extremity Assessment: Overall WFL for tasks assessed      Cervical / Trunk Assessment: Normal  Communication   Communication: No difficulties  Cognition Arousal/Alertness: Awake/alert Behavior During Therapy: WFL for tasks assessed/performed Overall Cognitive Status: Within Functional Limits for tasks assessed                      General Comments      Exercises        Assessment/Plan    PT Assessment Patent does not need any further PT services  PT Diagnosis Difficulty walking   PT Problem List    PT Treatment Interventions     PT Goals (Current goals can be found in the Care Plan section) Acute Rehab PT Goals Patient Stated Goal: Go home today. PT Goal Formulation: All assessment and education complete, DC therapy    Frequency  Barriers to discharge        Co-evaluation               End of Session Equipment Utilized During Treatment: Cervical collar Activity Tolerance: Patient tolerated treatment well Patient left: in bed;with call bell/phone within reach;with family/visitor present Nurse Communication: Mobility status    Functional Assessment Tool Used: Clinical Judgement Functional Limitation: Mobility: Walking and moving around Mobility: Walking and Moving Around Current Status (C1448): 0 percent impaired, limited or restricted Mobility: Walking and Moving Around Goal Status [phone]): 0 percent impaired, limited or restricted Mobility: Walking and Moving Around Discharge Status [phone]): 0 percent impaired, limited or restricted     Time: 2637-8588 PT Time Calculation (min) (ACUTE ONLY): 21 min   Charges:   PT Evaluation $Initial PT Evaluation Tier I: 1 Procedure     PT G Codes:   PT G-Codes **NOT FOR INPATIENT CLASS** Functional Assessment Tool Used: Clinical Judgement Functional Limitation: Mobility: Walking and moving around Mobility: Walking and Moving Around Current Status (F0277): 0 percent impaired, limited or restricted Mobility: Walking and Moving Around Goal Status (A1287): 0 percent impaired, limited or restricted Mobility: Walking and Moving Around Discharge Status (O6767): 0 percent impaired, limited or restricted    Catarina Hartshorn, [place] 12/22/2014, 10:00 AM

## 2014-12-22 NOTE — Op Note (Signed)
NAMEDESHANTA, LADY              ACCOUNT NO.:  [id number]  MEDICAL RECORD NO.:  [id number]  LOCATION:  3C11C                        FACILITY:  [facility]  PHYSICIAN:  Nilam Quakenbush D. Rolena Infante, M.D. 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  OPERATIVE PROCEDURES:  Anterior cervical diskectomy and fusion C5-6, C6- 7; instrumentation system used was Titan titanium intervertebral cages, 8 medium lordotic cages packed with DBX mix.  Anterior cervical DePuy Skyline plate, 16 mm screws into the body of the C5, 14 mm screws into the bodies of 6 and 7.  COMPLICATIONS:  None.  CONDITION:  Stable.  HISTORY:  This is a very pleasant 54 year old woman, who has been under my care for many years now.  She has had a progressive debilitating neck and radicular left arm pain.  Attempts at conservative management had failed to alleviate her symptoms and her quality of life continued to deteriorate.  As a result, we elected to proceed with surgery.  All appropriate risks, benefits, and alternatives were discussed with the patient, and consent was obtained.  OPERATIVE NOTE:  The patient was brought to the operating room, placed supine on the operating table.  After successful induction of general anesthesia and endotracheal intubation, TEDs, SCDs were applied. Restraints were placed on the wrist for intraoperative traction and an inflatable pressure bag was placed beneath the shoulder blades.  The anterior cervical spine was prepped and draped, and lordosis was restored using the pressure bag.  The patient was then prepped and draped in a standard fashion.  Time-out was taken to confirm the patient, procedure, and all  other pertinent important data.  Once this was done, x-ray of lateral fluoro was taken to identify the C6 vertebral body.  This was marked and an incision site was mapped out and infiltrated with 0.25% Marcaine and the incision was made.  Sharp dissection was carried out down to the platysma.  The platysma was sharply incised.  I made a standard Smith-Robinson approach to the anterior cervical spine, proceeding along the medial border of the sternocleidomastoid sharply dissected through the deep cervical and prevertebral fascia.  I identified, protected, and retracted the esophagus and identified the carotid sheath and protected this with a finger.  An appendiceal retractor was placed to maintain the protection and retraction and then using Kittner dissectors, to remove the remaining portion of the prevertebral fascia to completely expose the C5- 7.  Needle was placed into the 5-6 disk space.  An x-ray was taken confirming that I was at the appropriate level.  Once this was done, I used the Leksell rongeur to remove the large exostosis from the 5-6, 6-7 levels.  I then mobilized the longus colli muscle using bipolar electrocautery from the midbody of 5 to the midbody of 7.  I then used a self-retaining Caspar retractors, deflated the endotracheal cuff and expanded the retractor and then reinflated the endotracheal cuff. Annulotomy was performed at C6-7 with the 15 blade  scalpel and distraction pins were placed into the bodies of 6 and 7.  Diskectomy was performed using a micropituitary rongeur.  I then distracted the intervertebral space and maintained the distraction with the distraction pins set.  I continued to work posteriorly until I removed all of the posterior annulus.  I identified the posterior longitudinal ligament and underneath the uncovertebral joint.  At this point, I had an excellent decompression at C6-7 with uncovertebral joint decompression specimen on the left side.  I  rasped the endplates and then trialed the intervertebral space.  I then used an 8 medium Titan titanium cage, packed with DBX mix.  I then repositioned the distraction pins into the body of C5, and using the same technique that I just used at 6-7 level, performed a complete diskectomy at 5-6, did take down the posterior longitudinal ligament using a micro mini curettes, using a #1 Kerrison rongeur and nerve hook to dissect through the PLL.  I also did an aggressive foraminotomy, resection of the uncovertebral joints to improve the decompression as this was the more symptomatic of the 2 levels.  I rasped the endplates and placed the graft.  I then removed the distraction pins and placed to the anterior cervical plate size 32 mm long.  I then placed the appropriate size self-drilling screws and all screws had excellent purchase.  Once they were all down, I then torqued and locked the screws to the plate according to the manufacture standard.  I then irrigated the wound copiously with normal saline, removed the retracting devices, and checked to ensure the esophagus was not entrapped beneath the plate.  I then returned the trachea and esophagus to midline, closed the platysma with interrupted 2-0 Vicryl sutures and a 3-0 Monocryl for the skin.  Steri-Strips and dry dressing were applied.  The patient was ultimately extubated and transferred to the PACU without incident.  At the end of the case, all needle and sponge counts were correct.     Broly Hatfield D. Rolena Infante, M.D.     DDB/MEDQ  D:  12/21/2014  T:  12/22/2014  Job:  [id number]

## 2014-12-22 NOTE — Discharge Instructions (Signed)

## 2014-12-22 NOTE — Progress Notes (Signed)
    Subjective: Procedure(s) (LRB): ANTERIOR CERVICAL DECOMPRESSION/DISCECTOMY FUSION 2 LEVELS C5-7 (N/A) 1 Day Post-Op  Patient reports pain as 3 on 0-10 scale.  Reports decreased arm pain reports incisional neck pain   Positive void Negative bowel movement Positive flatus Negative chest pain or shortness of breath  Objective: Vital signs in last 24 hours: Temp:  [97.9 F (36.6 C)-98.9 F (37.2 C)] 98.9 F (37.2 C) (06/17 0415) Pulse Rate:  [73-94] 90 (06/17 0415) Resp:  [14-21] 20 (06/17 0415) BP: (116-135)/(57-73) 120/57 mmHg (06/17 0415) SpO2:  [94 %-98 %] 96 % (06/17 0415)  Intake/Output from previous day: 06/16 0701 - 06/17 0700 In: 240 [P.O.:240] Out: 25 [Blood:25]  Labs: No results for input(s): WBC, RBC, HCT, PLT in the last 72 hours. No results for input(s): NA, K, CL, CO2, BUN, CREATININE, GLUCOSE, CALCIUM in the last 72 hours. No results for input(s): LABPT, INR in the last 72 hours.  Physical Exam: Neurologically intact ABD soft Intact pulses distally Incision: dressing C/D/I and no drainage Compartment soft  Assessment/Plan: Patient stable  xrays satisfactory Mobilization with physical therapy Encourage incentive spirometry Continue care  Advance diet Up with therapy D/C IV fluids  D/c to home today  Melina Schools, MD [place] [phone]

## 2014-12-26 NOTE — Discharge Summary (Signed)
Patient ID: Joan Ray MRN: [id number] DOB/AGE: 14-Jun-1961 54 y.o.  Admit date: 12/21/2014 Discharge date: 12/26/2014  Admission Diagnoses:  Active Problems:   Neck pain   Discharge Diagnoses:  Active Problems:   Neck pain  status post Procedure(s): ANTERIOR CERVICAL DECOMPRESSION/DISCECTOMY FUSION 2 LEVELS C5-7  Past Medical History  Diagnosis Date  . Complication of anesthesia   . PONV (postoperative nausea and vomiting)   . GERD (gastroesophageal reflux disease)   . Headache(784.0)     migraines  . 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 Arthritis     osteo   . Anemia     h/o of   Last infusion of iron  2008  . Anxiety   . Depression     d/t decreased activity  . Restless leg syndrome   . MS (multiple sclerosis)   . Wears glasses   . Wears glasses   . High triglycerides   . Constipation     Surgeries: Procedure(s): ANTERIOR CERVICAL DECOMPRESSION/DISCECTOMY FUSION 2 LEVELS C5-7 on 12/21/2014   Consultants:    Discharged Condition: Improved  Hospital Course: Joan Ray is an 54 y.o. female who was admitted 12/21/2014 for operative treatment of <principal problem not specified>. Patient failed conservative treatments (please see the history and physical for the specifics) and had severe unremitting pain that affects sleep, daily activities and work/hobbies. After pre-op clearance, the patient was taken to the operating room on 12/21/2014 and underwent  Procedure(s): ANTERIOR CERVICAL DECOMPRESSION/DISCECTOMY FUSION 2 LEVELS C5-7.    Patient was given perioperative antibiotics:  Anti-infectives    Start     Dose/Rate Route Frequency Ordered Stop   12/21/14 1600  clindamycin (CLEOCIN) IVPB 900 mg     900 mg 100 mL/hr over 30 Minutes Intravenous Every 8 hours 12/21/14 1427 12/22/14 0813   12/21/14 0547  vancomycin (VANCOCIN) 1,500 mg in sodium chloride 0.9 % 500 mL IVPB  Status:  Discontinued     1,500 mg 250 mL/hr over 120  Minutes Intravenous 120 min pre-op 12/21/14 0548 12/21/14 0552       Patient was given sequential compression devices and early ambulation to prevent DVT.   Patient benefited maximally from hospital stay and there were no complications. At the time of discharge, the patient was urinating/moving their bowels without difficulty, tolerating a regular diet, pain is controlled with oral pain medications and they have been cleared by PT/OT.   Recent vital signs: No data found.    Recent laboratory studies: No results for input(s): WBC, HGB, HCT, PLT, NA, K, CL, CO2, BUN, CREATININE, GLUCOSE, INR, CALCIUM in the last 72 hours.  Invalid input(s): PT, 2   Discharge Medications:     Medication List    STOP taking these medications        celecoxib 200 MG capsule  Commonly known as:  CELEBREX     cyclobenzaprine 10 MG tablet  Commonly known as:  FLEXERIL     diclofenac sodium 1 % Gel  Commonly known as:  VOLTAREN     lidocaine 5 %  Commonly known as:  LIDODERM     phentermine 37.5 MG capsule     traZODone 100 MG tablet  Commonly known as:  DESYREL      TAKE these medications        amLODipine 10 MG tablet  Commonly known as:  NORVASC  Take 5 mg by mouth daily.     cetirizine 10 MG tablet  Commonly known as:  ZYRTEC  Take 10 mg by mouth daily.     clonazePAM 0.5 MG tablet  Commonly known as:  KLONOPIN  Take 0.5 mg by mouth at bedtime as needed for anxiety (and sleep).     docusate sodium 100 MG capsule  Commonly known as:  COLACE  Take 100 mg by mouth 2 (two) times daily.     docusate sodium 100 MG capsule  Commonly known as:  COLACE  Take 1 capsule (100 mg total) by mouth 3 (three) times daily as needed for mild constipation.     ergocalciferol 50000 UNITS capsule  Commonly known as:  VITAMIN D2  Take 50,000 Units by mouth 2 (two) times a week. Tuesday and Friday     esomeprazole 40 MG capsule  Commonly known as:  NEXIUM  Take 40 mg by mouth at bedtime.      fenofibrate 145 MG tablet  Commonly known as:  TRICOR  Take 145 mg by mouth at bedtime.     furosemide 20 MG tablet  Commonly known as:  LASIX  Take 20 mg by mouth daily as needed for edema.     Melatonin 10 MG Caps  Take 10 mg by mouth at bedtime.     methocarbamol 500 MG tablet  Commonly known as:  ROBAXIN  Take 1 tablet (500 mg total) by mouth 3 (three) times daily as needed for muscle spasms.     ondansetron 4 MG tablet  Commonly known as:  ZOFRAN  Take 1 tablet (4 mg total) by mouth every 8 (eight) hours as needed for nausea or vomiting.     oxybutynin 15 MG 24 hr tablet  Commonly known as:  DITROPAN XL  Take 15 mg by mouth at bedtime.     oxyCODONE-acetaminophen 10-325 MG per tablet  Commonly known as:  PERCOCET  Take 1 tablet by mouth every 4 (four) hours as needed for pain.     polyethylene glycol powder powder  Commonly known as:  GLYCOLAX  Take 17 g by mouth daily.     potassium chloride 10 MEQ tablet  Commonly known as:  K-DUR,KLOR-CON  Take 10 mEq by mouth daily as needed (lasix).     pregabalin 100 MG capsule  Commonly known as:  LYRICA  Take 100 mg by mouth 2 (two) times daily.     rizatriptan 10 MG tablet  Commonly known as:  MAXALT  Take 10 mg by mouth every 2 (two) hours as needed for migraine. May repeat in 2 hours if needed     TYSABRI 300 MG/15ML injection  Generic drug:  natalizumab  Inject 300 mg into the vein every 30 (thirty) days. Takes for MS     venlafaxine XR 150 MG 24 hr capsule  Commonly known as:  EFFEXOR-XR  Take 225 mg by mouth daily with breakfast.        Diagnostic Studies: Dg Cervical Spine 2 Or 3 Views  12/21/2014   CLINICAL DATA:  Status post spinal fusion  EXAM: CERVICAL SPINE - 2-3 VIEW  COMPARISON:  Intraoperative fluoroscopic images dated 12/21/2014 at 1004 hours  FINDINGS: Frontal and lateral C-spine radiographs demonstrating C5-7 ACDF in satisfactory position.  No fracture or dislocation is seen.  No prevertebral soft  tissue swelling.  Visualized lung apices are clear.  IMPRESSION: C5-7 ACDF in satisfactory position.   Electronically Signed   By: Julian Hy M.D.   On: 12/21/2014 12:57   Dg Cervical Spine 2-3 Views  12/21/2014  CLINICAL DATA:  Cervical disc disease.  EXAM: CERVICAL SPINE - 2-3 VIEW; DG C-ARM 61-120 MIN  COMPARISON:  None.  FINDINGS: AP and lateral C-arm images demonstrate the patient has undergone anterior cervical fusion at C5-6 and C6-7. Anterior plate, screws, and interbody fusion devices appear in excellent position.  IMPRESSION: Anterior fusion performed at C5-6 and C6-7.   Electronically Signed   By: Lorriane Shire M.D.   On: 12/21/2014 10:54   Dg C-arm 1-60 Min  12/21/2014   CLINICAL DATA:  Cervical disc disease.  EXAM: CERVICAL SPINE - 2-3 VIEW; DG C-ARM 61-120 MIN  COMPARISON:  None.  FINDINGS: AP and lateral C-arm images demonstrate the patient has undergone anterior cervical fusion at C5-6 and C6-7. Anterior plate, screws, and interbody fusion devices appear in excellent position.  IMPRESSION: Anterior fusion performed at C5-6 and C6-7.   Electronically Signed   By: Lorriane Shire M.D.   On: 12/21/2014 10:54          Follow-up Information    Follow up with Melina Schools D, MD. Schedule an appointment as soon as possible for a visit in 2 weeks.   Specialty:  Orthopedic Surgery   Why:  For suture removal, For wound re-check   Contact information:   [address] [place] [address] [phone]       Discharge Plan:  discharge to home  Disposition: tolerating regular diet, pain controlled, decreased radicular arm pain.  Hospital course uneventful.    Signed: Melina Schools D for Dr. Melina Schools [facility] [phone] 12/26/2014, 4:49 PM

## 2015-05-15 ENCOUNTER — Encounter: Payer: Self-pay | Admitting: *Deleted

## 2015-05-15 ENCOUNTER — Ambulatory Visit (INDEPENDENT_AMBULATORY_CARE_PROVIDER_SITE_OTHER): Payer: BLUE CROSS/BLUE SHIELD | Admitting: Neurology

## 2015-05-15 ENCOUNTER — Encounter: Payer: Self-pay | Admitting: Neurology

## 2015-05-15 ENCOUNTER — Ambulatory Visit (INDEPENDENT_AMBULATORY_CARE_PROVIDER_SITE_OTHER): Payer: Self-pay | Admitting: Neurology

## 2015-05-15 VITALS — BP 144/90 | HR 80 | Ht 68.0 in | Wt 269.0 lb

## 2015-05-15 DIAGNOSIS — M542 Cervicalgia: Secondary | ICD-10-CM | POA: Diagnosis not present

## 2015-05-15 DIAGNOSIS — R208 Other disturbances of skin sensation: Secondary | ICD-10-CM | POA: Diagnosis not present

## 2015-05-15 DIAGNOSIS — R269 Unspecified abnormalities of gait and mobility: Secondary | ICD-10-CM | POA: Diagnosis not present

## 2015-05-15 DIAGNOSIS — R5383 Other fatigue: Secondary | ICD-10-CM

## 2015-05-15 DIAGNOSIS — G43009 Migraine without aura, not intractable, without status migrainosus: Secondary | ICD-10-CM | POA: Diagnosis not present

## 2015-05-15 DIAGNOSIS — G4700 Insomnia, unspecified: Secondary | ICD-10-CM | POA: Diagnosis not present

## 2015-05-15 DIAGNOSIS — G2581 Restless legs syndrome: Secondary | ICD-10-CM

## 2015-05-15 DIAGNOSIS — G35 Multiple sclerosis: Secondary | ICD-10-CM | POA: Diagnosis not present

## 2015-05-15 DIAGNOSIS — Z8669 Personal history of other diseases of the nervous system and sense organs: Secondary | ICD-10-CM

## 2015-05-15 DIAGNOSIS — F418 Other specified anxiety disorders: Secondary | ICD-10-CM

## 2015-05-15 MED ORDER — CLONAZEPAM 0.5 MG PO TABS: 0.5000 mg | ORAL_TABLET | Freq: Two times a day (BID) | ORAL | Status: DC | PRN

## 2015-05-15 MED ORDER — DULOXETINE HCL 60 MG PO CPEP: 60.0000 mg | ORAL_CAPSULE | Freq: Every day | ORAL | Status: DC

## 2015-05-15 NOTE — Progress Notes (Signed)
Duplicate note created in error.

## 2015-05-15 NOTE — Progress Notes (Addendum)
[facility]  PATIENT: Joan Ray DOB: Jan 04, 1961  REFERRING DOCTOR OR PCP:  Melina Schools [phone]) [name] (PCP) SOURCE: Patient, MRI images on PACS and CD and notes from PCP and ortho  _________________________________   HISTORICAL  CHIEF COMPLAINT:  Chief Complaint  Patient presents with  . New Evaluation    In room 4 by herself.     HISTORY OF PRESENT ILLNESS:   I had the pleasure of seeing your patient, Joan Ray, at [facility] for a neurologic consultation regarding her multiple sclerosis and cervical and lumbar pain.    She is a 54 year old right-handed patient  Who was diagnosed with MS in April 2010 and is currently on Tysabri therapy. She is being referred for persistent pain.  Spine/limb pain:    She reports having  L5-S1 fusion in October 2014 and C5-C7 fusion June 2016.   Although some symptoms improved after surgery, she continues to have a lot of difficulties with neck pain, arm pain, back pain and leg pain.    She was placed on Lyrica for her pain and was titrated to 200 mg twice a day but could not tolerate that dose. She has since been reduced to 100 mg by mouth twice a day and tolerates that dose but continues to have pain.      She reports some axial neck and lower back pain but the dysesthetic pain in the hands and feet is more troublesome.    Both hands have the dysesthetic pain but in the legs, the right foot is much worse than the left.   She gets some benefit from Lyrica.   She felt her pain had been better on Cymbalta than on Effexor. However, in 2013 when she was having more neurologic issues the Cymbalta was discontinued.   In the right foot, the pain is most intense on the top of the foot and towards the ankle but not above the ankle.    I personally reviewed the MRI of the cervical spine from 10/07/2014.   It shows degenerative changes at C5-C6 resulting in left foraminal narrowing and changes at  C6-C7 resulting right foraminal narrowing. The spinal cord appears normal. Plain films done after surgery showed a C5-C7 fusion and lumbar plain films showed the L5-S1 fusion.  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 She also had pain in the left eye. She went to an ophthalmologist who did some tests and referred her or an MRI. The MRI was abnormal, consistent with MS and she was referred to Dr. Maye Hides.   Usually, she was placed on weekly Avonex injections. She did not like the way that she felt on the injections. Additionally, an MRI of the brain showed that she had 2 new lesions. Therefore, she was switched to Tysabri in 2011.     On Tysabri, she has had no definite exacerbations, no change in MRI. Additionally she tolerates the medication very well. She has had some low positive JCV antibody titers. We do not have the actual numbers.    She switched from Dr. Starleen Blue to Dr. William Hamburger in [place] in 2013. She is interested in changing to an [facility].    Last MRI was early 2016 at [facility] in [place].    Gait/strength/sensation: She feels her gait is currently doing well. In 2013 she had more difficulty with balance and was falling some. Of note, that episode may  have been due to being on a statin medication as she improved tremendously after discontinuing the pravastatin and being placed on fenofibrate.  She denies any weakness now. She does have some tingling in the feet and hands.  She has spinal issues as well as MS, it is difficult to know which is playing a big role.Lyrica has helped the dysesthetic sensations some. However, she does not tolerate higher dosages often very well. Her sensory symptoms were also much worse in 2013.  Bladder: She reports some urinary urgency and frequency at times. However, this may have predated the diagnosis of MS. She does not note any significant problems with bladder function.  Vision: She had a  good recovery from the optic neuritis that initiated the MS. She denies any significant problems with her vision now. She does wear glasses. There'll be some fluctuations in her vision at times.  Fatigue/sleep: She does report a fair amount of fatigue. This was worse at times in the past. The fatigue is often worse when she is hot. She notes problems with insomnia. Sleep onset insomnia is more of a problem and sleep maintenance insomnia. She used to sleep better when she was on Ambien in the past. She also slept better when she was on clonazepam which was prescribed for restless leg syndromes. She notes that she had a sleep study in the past that did not show sleep apnea but did show periodic limb movements of sleep.  Mood/cognition: She reports some depression and anxiety now. She felt she did best when she was on Cymbalta in the past but it was changed to Effexor which has not helped her as much. When she was on higher dosages of Effexor and Lyrica at the same time cognitive function was worse. However, she feels she is doing very well with cognitive function at the current time.  Migraine:   She has had migraine headaches and Dr. Starleen Blue had placed her on Norvasc with benefit.     REVIEW OF SYSTEMS: Constitutional: No fevers, chills, sweats, or change in appetite.    She has insomnia and restless leg syndrome she reports a lot of fatigue. Eyes: No visual changes, double vision, eye pain Ear, nose and throat: No hearing loss, ear pain, nasal congestion, sore throat Cardiovascular: No chest pain, palpitations Respiratory: No shortness of breath at rest or with exertion.   No wheezes GastrointestinaI: No nausea, vomiting, diarrhea, abdominal pain, fecal incontinence Genitourinary: No dysuria, urinary retention or frequency.  No nocturia. Musculoskeletal: as above Integumentary: No rash, pruritus, skin lesions Neurological: as above Psychiatric: Notes depression > anxiety Endocrine: No  palpitations, diaphoresis, change in appetite, change in weigh or increased thirst Hematologic/Lymphatic: No anemia, purpura, petechiae. Allergic/Immunologic: No itchy/runny eyes, nasal congestion, recent allergic reactions, rashes  ALLERGIES: Allergies  Allergen Reactions  . Hydrocodone-Acetaminophen Itching  . Progesterone Other (See Comments) and Rash    increase/profuse sweating-flushing-  . Sertraline Hcl Other (See Comments)  . Statins Swelling    muscle and joint pain/tightness  . Betadine [Povidone Iodine]     Blisters  . Codeine Itching    Rash  . Darvocet [Propoxyphene N-Acetaminophen] Nausea And Vomiting  . Diazepam     Increases agitation  . Dilaudid [Hydromorphone Hcl] Itching  . Doxycycline     Facial flushing  . Keppra [Levetiracetam]     Shaking, nervousness, palpations and flushing  . Lamictal [Lamotrigine]     Causes tongue and mouth to tingle  . Lipitor [Atorvastatin]     Severe  muscle and joint pain / ms relapse  . Lyrica [Pregabalin] Other (See Comments)    Doses over 300mg  decrease memory  . Neurontin [Gabapentin]     Impaired cognition, slower motor skills (intolerant)  . Other     EDTA causes blisters  . Pravastatin Other (See Comments)    Severe joint pain MS relapse  . Sertraline Other (See Comments)    Increased nervousness  . Topamax [Topiramate]     Confusion  . Zonisamide     Back pain, headaches, insomnia, chest pressure  . Cephalosporins Rash  . Erythromycin Itching and Rash  . Keflex [Cephalexin] Rash  . Vancomycin Rash    Pt stated, " I can't tolerate Vancomycin I get a red rash."    HOME MEDICATIONS:  Current outpatient prescriptions:  .  celecoxib (CELEBREX) 200 MG capsule, Take 200 mg by mouth daily., Disp: , Rfl:  .  cetirizine (ZYRTEC) 10 MG tablet, Take 10 mg by mouth daily., Disp: , Rfl:  .  cyclobenzaprine (FLEXERIL) 10 MG tablet, Take 10 mg by mouth 3 (three) times daily as needed., Disp: , Rfl: 0 .  diclofenac sodium  (VOLTAREN) 1 % GEL, Apply 1 application topically as needed., Disp: , Rfl:  .  docusate sodium (COLACE) 100 MG capsule, Take 100 mg by mouth 2 (two) times daily., Disp: , Rfl:  .  docusate sodium (COLACE) 100 MG capsule, Take 1 capsule (100 mg total) by mouth 3 (three) times daily as needed for mild constipation., Disp: 30 capsule, Rfl: 0 .  esomeprazole (NEXIUM) 40 MG capsule, Take 40 mg by mouth at bedtime., Disp: , Rfl:  .  fenofibrate (TRICOR) 145 MG tablet, Take 145 mg by mouth at bedtime. , Disp: , Rfl:  .  lidocaine (LIDODERM) 5 %, Place 1 patch onto the skin as needed., Disp: , Rfl: 0 .  Melatonin 10 MG CAPS, Take 10 mg by mouth at bedtime. , Disp: , Rfl:  .  methocarbamol (ROBAXIN) 500 MG tablet, Take 1 tablet (500 mg total) by mouth 3 (three) times daily as needed for muscle spasms., Disp: 60 tablet, Rfl: 0 .  natalizumab (TYSABRI) 300 MG/15ML injection, Inject 300 mg into the vein every 30 (thirty) days. Takes for MS, Disp: , Rfl:  .  oxyCODONE-acetaminophen (PERCOCET) 10-325 MG per tablet, Take 1 tablet by mouth every 4 (four) hours as needed for pain., Disp: 60 tablet, Rfl: 0 .  pregabalin (LYRICA) 100 MG capsule, Take 100 mg by mouth 2 (two) times daily., Disp: , Rfl:  .  rizatriptan (MAXALT) 10 MG tablet, Take 10 mg by mouth every 2 (two) hours as needed for migraine. May repeat in 2 hours if needed, Disp: , Rfl:  .  clonazePAM (KLONOPIN) 0.5 MG tablet, Take 1 tablet (0.5 mg total) by mouth 2 (two) times daily as needed for anxiety., Disp: 30 tablet, Rfl: 5 .  DULoxetine (CYMBALTA) 60 MG capsule, Take 1 capsule (60 mg total) by mouth daily., Disp: 30 capsule, Rfl: 11  PAST MEDICAL HISTORY: Past Medical History  Diagnosis Date  . Complication of anesthesia   . PONV (postoperative nausea and vomiting)   . GERD (gastroesophageal reflux disease)   . Headache(784.0)     migraines  . Neuromuscular disorder ([place])     mutliple sclerosis--dx 10/2008  . Arthritis     osteo   . Anemia       h/o of   Last infusion of iron  2008  . Anxiety   .  Depression     d/t decreased activity  . Restless leg syndrome   . MS (multiple sclerosis) ([place])   . Wears glasses   . Wears glasses   . High triglycerides   . Constipation     PAST SURGICAL HISTORY: Past Surgical History  Procedure Laterality Date  . Bladder suspension      occas  blood in urine  . Knee arthroscopy      bilateral  in 2010, has multiple scopes  . Stress fracture      right 2-3 toe  . Medial partial knee replacement      right  . Nerve blocks    . Joint replacement  2012    lt total knee  . Total knee arthroplasty  2008    partial rt   . Endometrial ablation  2010  . Spinal fusion  05-05-2013  . Foot arthrodesis Right 07/28/2013    Procedure: ARTHRODESIS RIGHT MIDFOOT OF THE FIRST -  THIRD TARSOMETATARSAL JOINTS ;  Surgeon: Wylene Simmer, MD;  Location: [facility];  Service: Orthopedics;  Laterality: Right;  . Gastrocnemius recession Right 07/28/2013    Procedure: RIGHT GASTROCNEMIUS RECESSION;  Surgeon: Wylene Simmer, MD;  Location: [facility];  Service: Orthopedics;  Laterality: Right;  . Anterior cervical decomp/discectomy fusion N/A 12/21/2014    Procedure: ANTERIOR CERVICAL DECOMPRESSION/DISCECTOMY FUSION 2 LEVELS C5-7;  Surgeon: Melina Schools, MD;  Location: [facility];  Service: Orthopedics;  Laterality: N/A;    FAMILY HISTORY: Family History  Problem Relation Age of Onset  . Heart disease Mother   . Cancer Mother   . Heart disease Other   . Hypertension Mother   . Hypertension Maternal Grandmother   . Hypertension Paternal Grandmother   . Diabetes Maternal Grandmother   . Kidney disease Maternal Grandmother   . Kidney disease Maternal Grandfather   . Rheum arthritis Maternal Grandmother   . Depression Mother   . Depression Brother   . Depression Maternal Grandmother   . Depression Maternal Grandfather     SOCIAL HISTORY:  Social History   Social History  .  Marital Status: Married    Spouse Name: N/A  . Number of Children: 3  . Years of Education: N/A   Occupational History  . Not on file.   Social History Main Topics  . Smoking status: Never Smoker   . Smokeless tobacco: Never Used  . Alcohol Use: Yes     Comment: socially--wine & beer  . Drug Use: No  . Sexual Activity: Not on file   Other Topics Concern  . Not on file   Social History Narrative   Lives at home with   Caffeine use:      PHYSICAL EXAM  Filed Vitals:   05/15/15 1318  BP: 144/90  Pulse: 80  Height: 5\' 8"  (1.727 m)  Weight: 269 lb (122.018 kg)    Body mass index is 40.91 kg/(m^2).   General: The patient is well-developed and well-nourished and in no acute distress  Eyes:  Funduscopic exam shows normal optic discs and retinal vessels.  Neck: The neck is supple, no carotid bruits are noted.  The neck is mildly tender.  Good ROM.    Anterior scar is well-healed  Cardiovascular: The heart has a regular rate and rhythm with a normal S1 and S2. There were no murmurs, gallops or rubs. Lungs are clear to auscultation.  Skin: Extremities are without significant edema.  Musculoskeletal:  Back is minimally tender.  Scar is well healed  Neurologic Exam  Mental status: The patient is alert and oriented x 3 at the time of the examination. The patient has apparent normal recent and remote memory, with an apparently normal attention span and concentration ability.   Speech is normal.  Cranial nerves: Extraocular movements are full. Pupils are equal, round, and reactive to light and accomodation.  Visual fields are full.  Facial symmetry is present. There is good facial sensation to soft touch bilaterally.Facial strength is normal.  Trapezius and sternocleidomastoid strength is normal. No dysarthria is noted.  The tongue is midline, and the patient has symmetric elevation of the soft palate. No obvious hearing deficits are noted.  Motor:  Muscle bulk is normal.    Tone is normal. Strength is  5 / 5 in all 4 extremities.   Sensory: Sensory testing is intact to pinprick, soft touch and vibration sensation in arms.   There is allodynia and slight reduced sensation in an L5 distribution of the right foot. Sensation was normal in the left foot..  Coordination: Cerebellar testing reveals good finger-nose-finger and heel-to-shin bilaterally.  Gait and station: Station is normal.   Gait is mildly widel. Tandem gait is wide. Romberg is negative.   Reflexes: Deep tendon reflexes are symmetric and normal bilaterally.   Plantar responses are flexor.       DIAGNOSTIC DATA (LABS, IMAGING, TESTING) - I reviewed patient records, labs, notes, testing and imaging myself where available.  Lab Results  Component Value Date   WBC 5.3 12/07/2014   HGB 12.4 12/07/2014   HCT 37.9 12/07/2014   MCV 89.6 12/07/2014   PLT 208 12/07/2014      Component Value Date/Time   NA 140 12/07/2014 0918   K 4.3 12/07/2014 0918   CL 106 12/07/2014 0918   CO2 26 12/07/2014 0918   GLUCOSE 106* 12/07/2014 0918   BUN 16 12/07/2014 0918   CREATININE 0.79 12/07/2014 0918   CALCIUM 9.2 12/07/2014 0918   PROT 6.8 04/27/2013 0916   ALBUMIN 4.0 04/27/2013 0916   AST 19 04/27/2013 0916   ALT 15 04/27/2013 0916   ALKPHOS 64 04/27/2013 0916   BILITOT 0.4 04/27/2013 0916   GFRNONAA >60 12/07/2014 0918   GFRAA >60 12/07/2014 0918      ASSESSMENT AND PLAN  Multiple sclerosis (HCC)  Dysesthesia  Neck pain  Depression with anxiety  Other fatigue  Insomnia  History of optic neuritis  Common migraine without intractability  Gait disturbance  Restless leg syndrome    In summary, Mrs. Joan Ray is a 54 year old woman with multiple sclerosis who also has had fusion at C5-C7 and L5-S1. Currently, she has dysesthetic pain in her hands and her right foot aAnd some milder neck pain.     Her pain seemed to have done better when she was on Cymbalta been on Effexor and I will  change her back to generic duloxetine 60 mg by mouth daily. Additionally, she has some sleep related issues with insomnia and restless leg syndrome and will likely benefit from getting back on nighttime clonazepam. She will continue the Lyrica. If her pain does not do better on this regimen, I would consider having her try lamotrigine. She tried it once in the past but noted some numbness in her tongue and stopped after only a short period of time.      She also has multiple sclerosis and has been very stable on Tysabri. It is reasonable for her to continue on that medication.  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     She would like to get the infusions here and we will look into making the changes.      I will see her back in 3 months or sooner if there are new or worsening neurologic symptoms.  Richard A. [name], MD, PhD 44/12/9505, 2:25 PM Certified in Neurology, Clinical Neurophysiology, Sleep Medicine, Pain Medicine and Neuroimaging  [facility] [address], [place], [place] [address] [phone]

## 2015-05-16 ENCOUNTER — Encounter: Payer: Self-pay | Admitting: *Deleted

## 2015-05-16 DIAGNOSIS — Z6841 Body Mass Index (BMI) 40.0 and over, adult: Secondary | ICD-10-CM | POA: Insufficient documentation

## 2015-05-29 ENCOUNTER — Encounter: Payer: Self-pay | Admitting: *Deleted

## 2015-05-30 ENCOUNTER — Telehealth: Payer: Self-pay | Admitting: Neurology

## 2015-05-30 NOTE — Telephone Encounter (Signed)
Pt needs refill on cyclobenzaprine (FLEXERIL) 10 MG tablet. Thank you

## 2015-05-30 NOTE — Telephone Encounter (Signed)
I called [facility], spoke with [facility].  She said the patient has been getting Flexeril 10mg  one tid prn from Dr Bland Span, however the patient would like our office to take over this Rx.  Okay to send?  Thank you.

## 2015-05-31 NOTE — Telephone Encounter (Signed)
We can take over.   Ok to send this in

## 2015-06-01 MED ORDER — CYCLOBENZAPRINE HCL 10 MG PO TABS: 10.0000 mg | ORAL_TABLET | Freq: Three times a day (TID) | ORAL | Status: DC | PRN

## 2015-07-18 ENCOUNTER — Encounter: Payer: Self-pay | Admitting: *Deleted

## 2015-08-07 ENCOUNTER — Telehealth: Payer: Self-pay | Admitting: Neurology

## 2015-08-07 NOTE — Telephone Encounter (Signed)
I have spoken with Joan Ray this morning.  Our connection was poor--he was very difficult to understand, I think due to accent and speaking rapidly.  He is requesting clinical information, but is unable to be specific about what he needs.  I have asked him to fax me a request for what he needs and I am happy to respond to that fax/fim

## 2015-08-07 NOTE — Telephone Encounter (Signed)
[facility] called and needs prior auth information on pt and medication natalizumab (TYSABRI) 300 MG/15ML injection, Fax: [phone], Phone: [phone] ext [phone]-[place],

## 2015-08-28 ENCOUNTER — Ambulatory Visit: Payer: BLUE CROSS/BLUE SHIELD | Admitting: Neurology

## 2015-08-29 ENCOUNTER — Encounter: Payer: Self-pay | Admitting: Neurology

## 2015-08-29 ENCOUNTER — Ambulatory Visit (INDEPENDENT_AMBULATORY_CARE_PROVIDER_SITE_OTHER): Payer: Commercial Managed Care - HMO | Admitting: Neurology

## 2015-08-29 VITALS — BP 128/70 | HR 74 | Resp 18 | Ht 68.0 in | Wt 269.8 lb

## 2015-08-29 DIAGNOSIS — R269 Unspecified abnormalities of gait and mobility: Secondary | ICD-10-CM | POA: Diagnosis not present

## 2015-08-29 DIAGNOSIS — Z8669 Personal history of other diseases of the nervous system and sense organs: Secondary | ICD-10-CM

## 2015-08-29 DIAGNOSIS — F418 Other specified anxiety disorders: Secondary | ICD-10-CM

## 2015-08-29 DIAGNOSIS — R5383 Other fatigue: Secondary | ICD-10-CM

## 2015-08-29 DIAGNOSIS — G35 Multiple sclerosis: Secondary | ICD-10-CM

## 2015-08-29 DIAGNOSIS — G2581 Restless legs syndrome: Secondary | ICD-10-CM

## 2015-08-29 DIAGNOSIS — G4700 Insomnia, unspecified: Secondary | ICD-10-CM | POA: Diagnosis not present

## 2015-08-29 DIAGNOSIS — R208 Other disturbances of skin sensation: Secondary | ICD-10-CM | POA: Diagnosis not present

## 2015-08-29 MED ORDER — OXYCODONE-ACETAMINOPHEN 10-325 MG PO TABS: 1.0000 | ORAL_TABLET | Freq: Three times a day (TID) | ORAL | Status: DC | PRN

## 2015-08-29 MED ORDER — PREGABALIN 100 MG PO CAPS: 100.0000 mg | ORAL_CAPSULE | Freq: Two times a day (BID) | ORAL | Status: DC

## 2015-08-29 MED ORDER — DULOXETINE HCL 60 MG PO CPEP: 120.0000 mg | ORAL_CAPSULE | Freq: Every day | ORAL | Status: DC

## 2015-08-29 NOTE — Progress Notes (Signed)
[facility]  PATIENT: Joan Ray DOB: 17-Jan-1961  REFERRING DOCTOR OR PCP:  Melina Schools [phone]) Also Joan Ray (PCP) SOURCE: Patient, MRI images on PACS and CD and notes from PCP and ortho  _________________________________   HISTORICAL  CHIEF COMPLAINT:  Chief Complaint  Patient presents with  . Multiple Sclerosis    Sts. she continues to tolerate Tysabri well.  JCV ab last checked 02-07-15 and was negative at 0.16.  Thinks her last mri brain was in Jan. 2015.  Sts. she continues to struggle with wt. loss.  Sts. Cymbalta has helped pain in hands and right foot some, not enough.  Sts. she takes Oxycodone prn muscle spasms and would like to know if RAS can take over this rx/fim    HISTORY OF PRESENT ILLNESS:  Joan Ray is a 55 year old right-handed patient  She was diagnosed with MS in April 2010 and is currently on Tysabri therapy. She also has for chronic pain in the neck and back.  Pain/sensation:   She has painful tingling in her hands and feet. Left hand and right foot are worse.   She is on Cymbalta and Lyrica and has had some benefit.   Higher dose of Lyrica affects her thinking.   Gabapentin and lamotrigine were not tolerated.     Gait/strength: She feels her gait is currently doing well.   No recent falls.  She denies any weakness now. She does have some tingling in the feet and hands.  She has spinal issues as well as MS, it is difficult to know which is playing a big role.  Bladder: She reports some urinary urgency and frequency at times. However, this may have predated the diagnosis of MS. She does not note any significant problems with bladder function.  Vision: She had a good recovery from the optic neuritis. She denies any significant problems with her vision now. She does wear glasses. There'll be some fluctuations in her vision at times.  Fatigue/sleep: She has physical and cognitive fatigue. The fatigue is often worse when she is  hot. She notes problems with insomnia. She has insomnia, but falls asleep easily now with melatonin and clonazepam.   She has 3 x nocturia.   RLS symptoms are much better on clonazepam and Lyrica. A sleep study in the past did not show sleep apnea but did show periodic limb movements of sleep.  Mood/cognition: She has mild depression and anxiety helped by Cymbalta. She notes mild cognitive issues.    Shehas poor attention and is easily distracted.    She has mild difficulty with executive function.    She has not had formal neurocognitive testing.    Migraine:   She has had migraine headaches and is on Norvasc with benefit.    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     Spine/limb pain history:    She reports having  L5-S1 fusion in October 2014 and C5-C7 fusion June 2016.   Although some symptoms improved after surgery, she continues to have a lot of difficulties with neck pain, arm pain, back pain and leg pain.    She was placed on Lyrica for her pain and was titrated to 200 mg twice a day but could not tolerate that dose. She has since been reduced to 100 mg by mouth twice a day and tolerates that dose but continues to have pain.  She reports some axial neck and lower back pain but the dysesthetic pain in the hands and feet is more troublesome.    Both hands have the dysesthetic pain but in the legs, the right foot is much worse than the left.   She gets some benefit from Lyrica.   She felt her pain had been better on Cymbalta than on Effexor. However, in 2013 when she was having more neurologic issues the Cymbalta was discontinued.   In the right foot, the pain is most intense on the top of the foot and towards the ankle but not above the ankle.    I personally reviewed the MRI of the cervical spine from 10/07/2014.   It shows degenerative changes at C5-C6 resulting in left foraminal narrowing and changes at C6-C7 resulting right  foraminal narrowing. The spinal cord appears normal. Plain films done after surgery showed a C5-C7 fusion and lumbar plain films showed the L5-S1 fusion.  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 She also had pain in the left eye. She went to an ophthalmologist who did some tests and referred her or an MRI. The MRI was abnormal, consistent with MS and she was referred to Dr. Maye Hides.   Usually, she was placed on weekly Avonex injections. She did not like the way that she felt on the injections. Additionally, an MRI of the brain showed that she had 2 new lesions. Therefore, she was switched to Tysabri in 2011.     On Tysabri, she has had no definite exacerbations, no change in MRI. Additionally she tolerates the medication very well. She has had some low positive JCV antibody titers. We do not have the actual numbers.    She switched from Dr. Starleen Blue to Dr. William Hamburger in [place] in 2013. She is interested in changing to an [place].    Last MRI was early 2016 at [facility] in [place].     REVIEW OF SYSTEMS: Constitutional: No fevers, chills, sweats, or change in appetite.    She has insomnia and restless leg syndrome she reports a lot of fatigue. Eyes: No visual changes, double vision, eye pain Ear, nose and throat: No hearing loss, ear pain, nasal congestion, sore throat Cardiovascular: No chest pain, palpitations Respiratory: No shortness of breath at rest or with exertion.   No wheezes GastrointestinaI: No nausea, vomiting, diarrhea, abdominal pain, fecal incontinence Genitourinary: No dysuria, urinary retention or frequency.  No nocturia. Musculoskeletal: as above Integumentary: No rash, pruritus, skin lesions Neurological: as above Psychiatric: Notes depression > anxiety Endocrine: No palpitations, diaphoresis, change in appetite, change in weigh or increased thirst Hematologic/Lymphatic: No anemia, purpura,  petechiae. Allergic/Immunologic: No itchy/runny eyes, nasal congestion, recent allergic reactions, rashes  ALLERGIES: Allergies  Allergen Reactions  . Hydrocodone-Acetaminophen Itching  . Progesterone Other (See Comments) and Rash    increase/profuse sweating-flushing-  . Sertraline Hcl Other (See Comments)  . Statins Swelling    muscle and joint pain/tightness  . Betadine [Povidone Iodine]     Blisters  . Codeine Itching    Rash  . Darvocet [Propoxyphene N-Acetaminophen] Nausea And Vomiting  . Diazepam     Increases agitation  . Dilaudid [Hydromorphone Hcl] Itching  . Doxycycline     Facial flushing  . Keppra [Levetiracetam]     Shaking, nervousness, palpations and flushing  . Lamictal [Lamotrigine]     Causes tongue and mouth to tingle  . Lipitor [Atorvastatin]  Severe muscle and joint pain / ms relapse  . Lyrica [Pregabalin] Other (See Comments)    Doses over 300mg  decrease memory  . Neurontin [Gabapentin]     Impaired cognition, slower motor skills (intolerant)  . Other     EDTA causes blisters  . Pravastatin Other (See Comments)    Severe joint pain MS relapse  . Sertraline Other (See Comments)    Increased nervousness  . Topamax [Topiramate]     Confusion  . Zonisamide     Back pain, headaches, insomnia, chest pressure  . Cephalosporins Rash  . Erythromycin Itching and Rash  . Keflex [Cephalexin] Rash  . Vancomycin Rash    Pt stated, " I can't tolerate Vancomycin I get a red rash."    HOME MEDICATIONS:  Current outpatient prescriptions:  .  buPROPion (WELLBUTRIN XL) 150 MG 24 hr tablet, Take by mouth., Disp: , Rfl:  .  celecoxib (CELEBREX) 200 MG capsule, Take 200 mg by mouth daily., Disp: , Rfl:  .  cetirizine (ZYRTEC) 10 MG tablet, Take 10 mg by mouth daily., Disp: , Rfl:  .  cholecalciferol (VITAMIN D) 1000 units tablet, Take 5,000 Units by mouth daily., Disp: , Rfl:  .  clonazePAM (KLONOPIN) 0.5 MG tablet, Take 1 tablet (0.5 mg total) by mouth 2  (two) times daily as needed for anxiety., Disp: 30 tablet, Rfl: 5 .  cyclobenzaprine (FLEXERIL) 10 MG tablet, Take 1 tablet (10 mg total) by mouth 3 (three) times daily as needed., Disp: 90 tablet, Rfl: 3 .  diclofenac sodium (VOLTAREN) 1 % GEL, Apply 1 application topically as needed., Disp: , Rfl:  .  docusate sodium (COLACE) 100 MG capsule, Take 100 mg by mouth 2 (two) times daily., Disp: , Rfl:  .  DULoxetine (CYMBALTA) 60 MG capsule, Take 1 capsule (60 mg total) by mouth daily., Disp: 30 capsule, Rfl: 11 .  esomeprazole (NEXIUM) 40 MG capsule, Take 40 mg by mouth at bedtime., Disp: , Rfl:  .  fenofibrate (TRICOR) 145 MG tablet, Take 145 mg by mouth at bedtime. , Disp: , Rfl:  .  lidocaine (LIDODERM) 5 %, Place 1 patch onto the skin as needed., Disp: , Rfl: 0 .  Melatonin 10 MG CAPS, Take 10 mg by mouth at bedtime. , Disp: , Rfl:  .  metFORMIN (GLUCOPHAGE-XR) 500 MG 24 hr tablet, , Disp: , Rfl: 0 .  natalizumab (TYSABRI) 300 MG/15ML injection, Inject 300 mg into the vein every 30 (thirty) days. Takes for MS, Disp: , Rfl:  .  oxyCODONE-acetaminophen (PERCOCET) 10-325 MG per tablet, Take 1 tablet by mouth every 4 (four) hours as needed for pain., Disp: 60 tablet, Rfl: 0 .  pregabalin (LYRICA) 100 MG capsule, Take 100 mg by mouth 2 (two) times daily., Disp: , Rfl:  .  rizatriptan (MAXALT) 10 MG tablet, Take 10 mg by mouth every 2 (two) hours as needed for migraine. May repeat in 2 hours if needed, Disp: , Rfl:   PAST MEDICAL HISTORY: Past Medical History  Diagnosis Date  . Complication of anesthesia   . PONV (postoperative nausea and vomiting)   . GERD (gastroesophageal reflux disease)   . Headache(784.0)     migraines  . Neuromuscular disorder ([place])     mutliple sclerosis--dx 10/2008  . Arthritis     osteo   . Anemia     h/o of   Last infusion of iron  2008  . Anxiety   . Depression     d/t decreased  activity  . Restless leg syndrome   . MS (multiple sclerosis) ([place])   . Wears  glasses   . Wears glasses   . High triglycerides   . Constipation     PAST SURGICAL HISTORY: Past Surgical History  Procedure Laterality Date  . Bladder suspension      occas  blood in urine  . Knee arthroscopy      bilateral  in 2010, has multiple scopes  . Stress fracture      right 2-3 toe  . Medial partial knee replacement      right  . Nerve blocks    . Joint replacement  2012    lt total knee  . Total knee arthroplasty  2008    partial rt   . Endometrial ablation  2010  . Spinal fusion  05-05-2013  . Foot arthrodesis Right 07/28/2013    Procedure: ARTHRODESIS RIGHT MIDFOOT OF THE FIRST -  THIRD TARSOMETATARSAL JOINTS ;  Surgeon: Wylene Simmer, MD;  Location: [facility];  Service: Orthopedics;  Laterality: Right;  . Gastrocnemius recession Right 07/28/2013    Procedure: RIGHT GASTROCNEMIUS RECESSION;  Surgeon: Wylene Simmer, MD;  Location: [facility];  Service: Orthopedics;  Laterality: Right;  . Anterior cervical decomp/discectomy fusion N/A 12/21/2014    Procedure: ANTERIOR CERVICAL DECOMPRESSION/DISCECTOMY FUSION 2 LEVELS C5-7;  Surgeon: Melina Schools, MD;  Location: [facility];  Service: Orthopedics;  Laterality: N/A;    FAMILY HISTORY: Family History  Problem Relation Age of Onset  . Heart disease Mother   . Cancer Mother   . Heart disease Other   . Hypertension Mother   . Hypertension Maternal Grandmother   . Hypertension Paternal Grandmother   . Diabetes Maternal Grandmother   . Kidney disease Maternal Grandmother   . Kidney disease Maternal Grandfather   . Rheum arthritis Maternal Grandmother   . Depression Mother   . Depression Brother   . Depression Maternal Grandmother   . Depression Maternal Grandfather     SOCIAL HISTORY:  Social History   Social History  . Marital Status: Married    Spouse Name: N/A  . Number of Children: 3  . Years of Education: N/A   Occupational History  . Not on file.   Social History Main Topics   . Smoking status: Never Smoker   . Smokeless tobacco: Never Used  . Alcohol Use: Yes     Comment: socially--wine & beer  . Drug Use: No  . Sexual Activity: Not on file   Other Topics Concern  . Not on file   Social History Narrative   Lives at home with   Caffeine use:      PHYSICAL EXAM  Filed Vitals:   08/29/15 0938  BP: 128/70  Pulse: 74  Resp: 18  Height: 5\' 8"  (1.727 m)  Weight: 269 lb 12.8 oz (122.38 kg)    Body mass index is 41.03 kg/(m^2).   General: The patient is well-developed and well-nourished and in no acute distress  Neurologic Exam  Mental status: The patient is alert and oriented x 3 at the time of the examination. The patient has apparent normal recent and remote memory, with an apparently normal attention span and concentration ability.   Speech is normal.  Cranial nerves: Extraocular movements are full. There is good facial sensation to soft touch bilaterally.Facial strength is normal.  Trapezius and sternocleidomastoid strength is normal. No dysarthria is noted.  The tongue is midline, and the patient has symmetric  elevation of the soft palate. No obvious hearing deficits are noted.  Motor:  Muscle bulk is normal.   Tone is normal. Strength is  5 / 5 in all 4 extremities x 4+/5 right EHL  Sensory: Sensory testing is intact to pinprick, soft touch and vibration sensation in arms.  Allodynia and reduced sensation to touch in an L5 distribution of the right foot. Sensation was normal in the left foot..  Coordination: Cerebellar testing reveals good finger-nose-finger and heel-to-shin bilaterally.  Gait and station: Station is normal.   Gait is mildly wide. Tandem gait is wide. Romberg is negative.   Reflexes: Deep tendon reflexes are symmetric and normal bilaterally.          DIAGNOSTIC DATA (LABS, IMAGING, TESTING) - I reviewed patient records, labs, notes, testing and imaging myself where available.  Lab Results  Component Value Date    WBC 5.3 12/07/2014   HGB 12.4 12/07/2014   HCT 37.9 12/07/2014   MCV 89.6 12/07/2014   PLT 208 12/07/2014      Component Value Date/Time   NA 140 12/07/2014 0918   K 4.3 12/07/2014 0918   CL 106 12/07/2014 0918   CO2 26 12/07/2014 0918   GLUCOSE 106* 12/07/2014 0918   BUN 16 12/07/2014 0918   CREATININE 0.79 12/07/2014 0918   CALCIUM 9.2 12/07/2014 0918   PROT 6.8 04/27/2013 0916   ALBUMIN 4.0 04/27/2013 0916   AST 19 04/27/2013 0916   ALT 15 04/27/2013 0916   ALKPHOS 64 04/27/2013 0916   BILITOT 0.4 04/27/2013 0916   GFRNONAA >60 12/07/2014 0918   GFRAA >60 12/07/2014 0918      ASSESSMENT AND PLAN  Multiple sclerosis (HCC)  Gait disturbance  History of optic neuritis  Depression with anxiety  Dysesthesia  Other fatigue  Insomnia  Restless leg syndrome    1.   Continue Tysabri. We will check JCV antibody and CBC today. I'll also write the brain with and without contrast. If there is subclinical activity, we would need to consider a different medication as disease modifying therapy.   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 2.  Increase Cymbalta to 120 mg daily. 3.  Renew Percocet and pregabalin. 4.   She is advised to stay active and exercises as tolerated.  I will see her back in 3 months or sooner if there are new or worsening neurologic symptoms.  Mela Perham A. [name], MD, PhD [id number], [id number] AM Certified in Neurology, Clinical Neurophysiology, Sleep Medicine, Pain Medicine and Neuroimaging  [facility] [address] [place], [place] [address] [phone]

## 2015-08-30 LAB — CBC WITH DIFFERENTIAL/PLATELET
Basophils Absolute: 0 10*3/uL (ref 0.0–0.2)
Basos: 1 %
EOS (ABSOLUTE): 0.3 10*3/uL (ref 0.0–0.4)
EOS: 5 %
Hematocrit: 38.6 % (ref 34.0–46.6)
Hemoglobin: 13 g/dL (ref 11.1–15.9)
IMMATURE GRANULOCYTES: 1 %
Immature Grans (Abs): 0 10*3/uL (ref 0.0–0.1)
LYMPHS ABS: 2.8 10*3/uL (ref 0.7–3.1)
Lymphs: 43 %
MCH: 29.5 pg (ref 26.6–33.0)
MCHC: 33.7 g/dL (ref 31.5–35.7)
MCV: 88 fL (ref 79–97)
MONOCYTES: 6 %
Monocytes Absolute: 0.4 10*3/uL (ref 0.1–0.9)
NEUTROS ABS: 2.8 10*3/uL (ref 1.4–7.0)
Neutrophils: 44 %
PLATELETS: 207 10*3/uL (ref 150–379)
RBC: 4.4 x10E6/uL (ref 3.77–5.28)
RDW: 15.8 % — ABNORMAL HIGH (ref 12.3–15.4)
WBC: 6.3 10*3/uL (ref 3.4–10.8)

## 2015-09-05 ENCOUNTER — Encounter: Payer: Self-pay | Admitting: *Deleted

## 2015-09-19 ENCOUNTER — Ambulatory Visit (INDEPENDENT_AMBULATORY_CARE_PROVIDER_SITE_OTHER): Payer: Commercial Managed Care - HMO

## 2015-09-19 DIAGNOSIS — R269 Unspecified abnormalities of gait and mobility: Secondary | ICD-10-CM | POA: Diagnosis not present

## 2015-09-19 DIAGNOSIS — R208 Other disturbances of skin sensation: Secondary | ICD-10-CM | POA: Diagnosis not present

## 2015-09-19 DIAGNOSIS — G35 Multiple sclerosis: Secondary | ICD-10-CM | POA: Diagnosis not present

## 2015-09-19 MED ORDER — GADOPENTETATE DIMEGLUMINE 469.01 MG/ML IV SOLN: 20.0000 mL | Freq: Once | INTRAVENOUS | Status: AC | PRN

## 2015-09-21 ENCOUNTER — Telehealth: Payer: Self-pay | Admitting: *Deleted

## 2015-09-21 NOTE — Telephone Encounter (Signed)
-----   Message from Britt Bottom, MD sent at 09/20/2015  7:23 PM EDT ----- Please let her know that the MRI of the brain looks good. There are no new lesions compared to the 2011 MRI.

## 2015-09-21 NOTE — Telephone Encounter (Signed)
I have spoken with Joan Ray this morning and per RAS, advised that mri brain shows no new lesions when compared to the mri she had in 2011.  She verbalized understanding of same/fim

## 2015-11-15 ENCOUNTER — Telehealth: Payer: Self-pay | Admitting: *Deleted

## 2015-11-15 MED ORDER — CLONAZEPAM 0.5 MG PO TABS: 0.5000 mg | ORAL_TABLET | Freq: Two times a day (BID) | ORAL | Status: DC | PRN

## 2015-11-15 NOTE — Telephone Encounter (Signed)
I have spoken with pharmacist who sts. she finally received faxed rx. for clonazepam/fim

## 2015-11-15 NOTE — Telephone Encounter (Signed)
Clonazepam rx. faxed to [facility] fax # [phone], per faxed request/fim

## 2015-11-15 NOTE — Telephone Encounter (Signed)
Kim/[facility]  (p) [phone] said they have not rec'd fax for clonazePAM (KLONOPIN) 0.5 MG tablet . She said they are having trouble receiving faxes and could give a VO.

## 2015-11-28 ENCOUNTER — Other Ambulatory Visit: Payer: Self-pay | Admitting: Neurology

## 2016-01-07 ENCOUNTER — Encounter: Payer: Self-pay | Admitting: *Deleted

## 2016-01-09 ENCOUNTER — Encounter (INDEPENDENT_AMBULATORY_CARE_PROVIDER_SITE_OTHER): Payer: Self-pay

## 2016-01-29 ENCOUNTER — Ambulatory Visit (INDEPENDENT_AMBULATORY_CARE_PROVIDER_SITE_OTHER): Payer: Commercial Managed Care - HMO | Admitting: Neurology

## 2016-01-29 ENCOUNTER — Encounter: Payer: Self-pay | Admitting: Neurology

## 2016-01-29 VITALS — BP 128/72 | Resp 16 | Ht 68.0 in | Wt 261.5 lb

## 2016-01-29 DIAGNOSIS — G35 Multiple sclerosis: Secondary | ICD-10-CM | POA: Diagnosis not present

## 2016-01-29 DIAGNOSIS — R5383 Other fatigue: Secondary | ICD-10-CM

## 2016-01-29 DIAGNOSIS — R269 Unspecified abnormalities of gait and mobility: Secondary | ICD-10-CM | POA: Diagnosis not present

## 2016-01-29 DIAGNOSIS — M542 Cervicalgia: Secondary | ICD-10-CM | POA: Diagnosis not present

## 2016-01-29 DIAGNOSIS — G43009 Migraine without aura, not intractable, without status migrainosus: Secondary | ICD-10-CM | POA: Diagnosis not present

## 2016-01-29 DIAGNOSIS — G4700 Insomnia, unspecified: Secondary | ICD-10-CM

## 2016-01-29 DIAGNOSIS — R208 Other disturbances of skin sensation: Secondary | ICD-10-CM

## 2016-01-29 DIAGNOSIS — F418 Other specified anxiety disorders: Secondary | ICD-10-CM | POA: Diagnosis not present

## 2016-01-29 NOTE — Progress Notes (Signed)
[facility]  PATIENT: Joan Ray DOB: 07/03/1961  REFERRING DOCTOR OR PCP:  Melina Schools [phone]) [name] (PCP) SOURCE: Patient, MRI images on PACS and CD and notes from PCP and ortho  _________________________________   HISTORICAL  CHIEF COMPLAINT:  Chief Complaint  Patient presents with  . Multiple Sclerosis    Sts. she continues to tolerate Tysabri well.  JCV ab last checked  08-30-15 and wa positive at 0.22.  Sts. 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  Sometimes fatigue is increased if she works 2 days in a row.  She's been having more bilat knee pain--she sees ortho for this.  She would like to have bariatric surgery--is requesting a letter from Dr. [name] stating that wt. loss would help her with ADL's, knee pain./fim    HISTORY OF PRESENT ILLNESS:  Joan Ray is a 55 year old right-handed patient  She was diagnosed with MS in April 2010 and is currently on Tysabri therapy.    She also has neck pain and back pain.   She is considering a bariatric surgical procedure.     She tries to work some part time but fatigue is much worse if she works more than a day or two in a row.   She has a lot more knee pain and she is hoping weight loss will help this.     MS:   She tolerates Tysabri well and has no exacerbations on it.   Recent MRI 09/19/2015 showed no definite new lesions compared to 07/02/2010.  Gait/strength: She feels her gait is currently doing well.   No recent falls.  She denies any weakness now. She does have some tingling in the feet and hands.  No ataxia.     Bladder: She reports urinary urgency and frequency at times.  She does not note any significant problems with bladder function.   No incontinence.     Vision: She had a good recovery from the optic neuritis. She denies any significant problems with her vision now. She does wear glasses. There'll be some fluctuations in her vision at times.  Fatigue/sleep: Her main problem  with MS is physical and cognitive fatigue. The fatigue is often worse when she is hot. It is worse after she works.    She notes problems with insomnia. She has insomnia, but falls asleep easily now with melatonin and clonazepam.   She has 3 x nocturia.   RLS symptoms are much better on clonazepam and Lyrica. A sleep study in the past did not show sleep apnea but did show periodic limb movements of sleep.  Pain:  She has pain, worse in right knee, right foot and lower back and neck.   Activity increases the pain  Mood/cognition: She has mild depression and anxiety helped by Cymbalta. She notes mild cognitive issues.    Shehas poor attention and is easily distracted.    She has mild difficulty with executive function.    She has not had formal neurocognitive testing.    Migraine:   She has had migraine headaches and is on Norvasc with benefit.    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     Spine/limb pain history:    She reports having  L5-S1 fusion in October 2014 and C5-C7 fusion June 2016.   Although some symptoms improved after surgery, she continues to have a lot  of difficulties with neck pain, arm pain, back pain and leg pain.    She was placed on Lyrica for her pain and was titrated to 200 mg twice a day but could not tolerate that dose. She has since been reduced to 100 mg by mouth twice a day and tolerates that dose but continues to have pain.      She reports some axial neck and lower back pain but the dysesthetic pain in the hands and feet is more troublesome.    Both hands have the dysesthetic pain but in the legs, the right foot is much worse than the left.   She gets some benefit from Lyrica.   She felt her pain had been better on Cymbalta than on Effexor. However, in 2013 when she was having more neurologic issues the Cymbalta was discontinued.   In the right foot, the pain is most intense on the top of the foot and  towards the ankle but not above the ankle.    I personally reviewed the MRI of the cervical spine from 10/07/2014.   It shows degenerative changes at C5-C6 resulting in left foraminal narrowing and changes at C6-C7 resulting right foraminal narrowing. The spinal cord appears normal. Plain films done after surgery showed a C5-C7 fusion and lumbar plain films showed the L5-S1 fusion.  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 She also had pain in the left eye. She went to an ophthalmologist who did some tests and referred her or an MRI. The MRI was abnormal, consistent with MS and she was referred to Dr. Maye Hides.   Usually, she was placed on weekly Avonex injections. She did not like the way that she felt on the injections. Additionally, an MRI of the brain showed that she had 2 new lesions. Therefore, she was switched to Tysabri in 2011.     On Tysabri, she has had no definite exacerbations, no change in MRI. Additionally she tolerates the medication very well. She has had some low positive JCV antibody titers. We do not have the actual numbers.    She switched from Dr. Starleen Blue to Dr. William Hamburger in [place] in 2013. She is interested in changing to an [place].    Last MRI was early 2016 at [facility] in [place].     REVIEW OF SYSTEMS: Constitutional: No fevers, chills, sweats, or change in appetite.    She has insomnia and restless leg syndrome she reports a lot of fatigue. Eyes: No visual changes, double vision, eye pain Ear, nose and throat: No hearing loss, ear pain, nasal congestion, sore throat Cardiovascular: No chest pain, palpitations Respiratory: No shortness of breath at rest or with exertion.   No wheezes GastrointestinaI: No nausea, vomiting, diarrhea, abdominal pain, fecal incontinence Genitourinary: No dysuria, urinary retention or frequency.  No nocturia. Musculoskeletal: as above Integumentary: No rash, pruritus, skin  lesions Neurological: as above Psychiatric: Notes depression > anxiety Endocrine: No palpitations, diaphoresis, change in appetite, change in weigh or increased thirst Hematologic/Lymphatic: No anemia, purpura, petechiae. Allergic/Immunologic: No itchy/runny eyes, nasal congestion, recent allergic reactions, rashes  ALLERGIES: Allergies  Allergen Reactions  . Hydrocodone-Acetaminophen Itching  . Progesterone Other (See Comments) and Rash    increase/profuse sweating-flushing-  . Sertraline Hcl Other (See Comments)  . Statins Swelling    muscle and joint pain/tightness  . Betadine [Povidone Iodine]     Blisters  . Codeine Itching    Rash  .  Darvocet [Propoxyphene N-Acetaminophen] Nausea And Vomiting  . Diazepam     Increases agitation  . Dilaudid [Hydromorphone Hcl] Itching  . Doxycycline     Facial flushing  . Keppra [Levetiracetam]     Shaking, nervousness, palpations and flushing  . Lamictal [Lamotrigine]     Causes tongue and mouth to tingle  . Lipitor [Atorvastatin]     Severe muscle and joint pain / ms relapse  . Lyrica [Pregabalin] Other (See Comments)    Doses over 300mg  decrease memory  . Neurontin [Gabapentin]     Impaired cognition, slower motor skills (intolerant)  . Other     EDTA causes blisters  . Pravastatin Other (See Comments)    Severe joint pain MS relapse  . Sertraline Other (See Comments)    Increased nervousness  . Topamax [Topiramate]     Confusion  . Zonisamide     Back pain, headaches, insomnia, chest pressure  . Cephalosporins Rash  . Erythromycin Itching and Rash  . Keflex [Cephalexin] Rash  . Vancomycin Rash    Pt stated, " I can't tolerate Vancomycin I get a red rash."    HOME MEDICATIONS:  Current Outpatient Prescriptions:  .  celecoxib (CELEBREX) 200 MG capsule, Take 200 mg by mouth daily., Disp: , Rfl:  .  cetirizine (ZYRTEC) 10 MG tablet, Take 10 mg by mouth daily., Disp: , Rfl:  .  cholecalciferol (VITAMIN D) 1000 units  tablet, Take 5,000 Units by mouth daily., Disp: , Rfl:  .  clonazePAM (KLONOPIN) 0.5 MG tablet, Take 1 tablet (0.5 mg total) by mouth 2 (two) times daily as needed for anxiety., Disp: 30 tablet, Rfl: 5 .  cyclobenzaprine (FLEXERIL) 10 MG tablet, take 1 tablet by mouth three times a day if needed, Disp: 90 tablet, Rfl: 3 .  diclofenac sodium (VOLTAREN) 1 % GEL, Apply 1 application topically as needed., Disp: , Rfl:  .  docusate sodium (COLACE) 100 MG capsule, Take 100 mg by mouth 2 (two) times daily., Disp: , Rfl:  .  DULoxetine (CYMBALTA) 60 MG capsule, Take 2 capsules (120 mg total) by mouth daily., Disp: 60 capsule, Rfl: 11 .  esomeprazole (NEXIUM) 40 MG capsule, Take 40 mg by mouth at bedtime., Disp: , Rfl:  .  fenofibrate (TRICOR) 145 MG tablet, Take 145 mg by mouth at bedtime. , Disp: , Rfl:  .  lidocaine (LIDODERM) 5 %, Place 1 patch onto the skin as needed., Disp: , Rfl: 0 .  Melatonin 10 MG CAPS, Take 10 mg by mouth at bedtime. , Disp: , Rfl:  .  natalizumab (TYSABRI) 300 MG/15ML injection, Inject 300 mg into the vein every 30 (thirty) days. Takes for MS, Disp: , Rfl:  .  oxyCODONE-acetaminophen (PERCOCET) 10-325 MG tablet, Take 1 tablet by mouth every 8 (eight) hours as needed for pain., Disp: 60 tablet, Rfl: 0 .  pregabalin (LYRICA) 100 MG capsule, Take 1 capsule (100 mg total) by mouth 2 (two) times daily., Disp: 60 capsule, Rfl: 5 .  rizatriptan (MAXALT) 10 MG tablet, Take 10 mg by mouth every 2 (two) hours as needed for migraine. May repeat in 2 hours if needed, Disp: , Rfl:  .  buPROPion (WELLBUTRIN XL) 150 MG 24 hr tablet, Take by mouth., Disp: , Rfl:  .  metFORMIN (GLUCOPHAGE-XR) 500 MG 24 hr tablet, , Disp: , Rfl: 0 No current facility-administered medications for this visit.   Facility-Administered Medications Ordered in Other Visits:  .  gadopentetate dimeglumine (MAGNEVIST) injection 20 mL, 20  mL, Intravenous, Once PRN, Britt Bottom, MD  PAST MEDICAL HISTORY: Past Medical  History:  Diagnosis Date  . Anemia    h/o of   Last infusion of iron  2008  . Anxiety   . Arthritis    osteo   . Complication of anesthesia   . Constipation   . Depression    d/t decreased activity  . GERD (gastroesophageal reflux disease)   . Headache(784.0)    migraines  . High triglycerides   . MS (multiple sclerosis) ([place])   . Neuromuscular disorder ([place])    mutliple sclerosis--dx 10/2008  . PONV (postoperative nausea and vomiting)   . Restless leg syndrome   . Wears glasses   . Wears glasses     PAST SURGICAL HISTORY: Past Surgical History:  Procedure Laterality Date  . ANTERIOR CERVICAL DECOMP/DISCECTOMY FUSION N/A 12/21/2014   Procedure: ANTERIOR CERVICAL DECOMPRESSION/DISCECTOMY FUSION 2 LEVELS C5-7;  Surgeon: Melina Schools, MD;  Location: [facility];  Service: Orthopedics;  Laterality: N/A;  . BLADDER SUSPENSION     occas  blood in urine  . ENDOMETRIAL ABLATION  2010  . FOOT ARTHRODESIS Right 07/28/2013   Procedure: ARTHRODESIS RIGHT MIDFOOT OF THE FIRST -  THIRD TARSOMETATARSAL JOINTS ;  Surgeon: Wylene Simmer, MD;  Location: [facility];  Service: Orthopedics;  Laterality: Right;  . GASTROCNEMIUS RECESSION Right 07/28/2013   Procedure: RIGHT GASTROCNEMIUS RECESSION;  Surgeon: Wylene Simmer, MD;  Location: [facility];  Service: Orthopedics;  Laterality: Right;  . JOINT REPLACEMENT  2012   lt total knee  . KNEE ARTHROSCOPY     bilateral  in 2010, has multiple scopes  . MEDIAL PARTIAL KNEE REPLACEMENT     right  . nerve blocks    . SPINAL FUSION  05-05-2013  . stress fracture     right 2-3 toe  . TOTAL KNEE ARTHROPLASTY  2008   partial rt     FAMILY HISTORY: Family History  Problem Relation Age of Onset  . Heart disease Mother   . Cancer Mother   . Hypertension Mother   . Depression Mother   . Heart disease Other   . Hypertension Maternal Grandmother   . Diabetes Maternal Grandmother   . Kidney disease Maternal Grandmother   . Rheum  arthritis Maternal Grandmother   . Depression Maternal Grandmother   . Hypertension Paternal Grandmother   . Kidney disease Maternal Grandfather   . Depression Maternal Grandfather   . Depression Brother     SOCIAL HISTORY:  Social History   Social History  . Marital status: Married    Spouse name: N/A  . Number of children: 3  . Years of education: N/A   Occupational History  . Not on file.   Social History Main Topics  . Smoking status: Never Smoker  . Smokeless tobacco: Never Used  . Alcohol use Yes     Comment: socially--wine & beer  . Drug use: No  . Sexual activity: Not on file   Other Topics Concern  . Not on file   Social History Narrative   Lives at home with   Caffeine use:      PHYSICAL EXAM  Vitals:   01/29/16 1049  BP: 128/72  Resp: 16  Weight: 261 lb 8 oz (118.6 kg)  Height: 5\' 8"  (1.727 m)    Body mass index is 39.76 kg/m.   General: The patient is well-developed and well-nourished and in no acute distress  Neurologic Exam  Mental status: The patient is alert and oriented x 3 at the time of the examination. The patient has apparent normal recent and remote memory, with an apparently normal attention span and concentration ability.   Speech is normal.  Cranial nerves: Extraocular movements are full. There is good facial sensation to soft touch bilaterally.Facial strength is normal.  Trapezius and sternocleidomastoid strength is normal. No dysarthria is noted.  The tongue is midline, and the patient has symmetric elevation of the soft palate. No obvious hearing deficits are noted.  Motor:  Muscle bulk is normal.   Tone is normal. Strength is  5 / 5 in all 4 extremities x 4+/5 right EHL  Sensory: Sensory testing is intact to pinprick, soft touch and vibration sensation in arms.  Allodynia and reduced sensation to touch in an L5 distribution of the right foot. Sensation was normal in the left foot..  Coordination: Cerebellar testing reveals  good finger-nose-finger and heel-to-shin bilaterally.  Gait and station: Station is normal.   Gait is mildly wide. Tandem gait is wide. She is mildly stiff. Romberg is negative.   Reflexes: Deep tendon reflexes are symmetric and normal bilaterally.          DIAGNOSTIC DATA (LABS, IMAGING, TESTING) - I reviewed patient records, labs, notes, testing and imaging myself where available.  Lab Results  Component Value Date   WBC 6.3 08/29/2015   HGB 12.4 12/07/2014   HCT 38.6 08/29/2015   MCV 88 08/29/2015   PLT 207 08/29/2015      Component Value Date/Time   NA 140 12/07/2014 0918   K 4.3 12/07/2014 0918   CL 106 12/07/2014 0918   CO2 26 12/07/2014 0918   GLUCOSE 106 (H) 12/07/2014 0918   BUN 16 12/07/2014 0918   CREATININE 0.79 12/07/2014 0918   CALCIUM 9.2 12/07/2014 0918   PROT 6.8 04/27/2013 0916   ALBUMIN 4.0 04/27/2013 0916   AST 19 04/27/2013 0916   ALT 15 04/27/2013 0916   ALKPHOS 64 04/27/2013 0916   BILITOT 0.4 04/27/2013 0916   GFRNONAA >60 12/07/2014 0918   GFRAA >60 12/07/2014 0918      ASSESSMENT AND PLAN  Multiple sclerosis (HCC)  Common migraine without intractability  Dysesthesia  Depression with anxiety  Other fatigue  Insomnia  Gait disturbance  Neck pain    1.   Continue Tysabri. We will check JCV antibody and CBC today    If she converts from low to high positive JCV AB, we would need to consider a different DMT for her MS,      2.  Letter to neurologically clear for Bariatric surgery.   Surgery may also help her pain and fatigue and allow her to reduce or eliminate some of her medications 3.  Continue cymbalta and pregabalin. 4.   She is advised to stay active and exercises as tolerated.   I will see her back in 5 months or sooner if there are new or worsening neurologic symptoms.  45 minute face to face visit with > 1/2 the time counseling or coordinating care about he MS, symptoms and pain issues.  Richard A. [name], MD, PhD  [id number], [id number] AM Certified in Neurology, Clinical Neurophysiology, Sleep Medicine, Pain Medicine and Neuroimaging  [facility] [address], [place], [place] [address] [phone]

## 2016-01-30 LAB — CBC WITH DIFFERENTIAL/PLATELET
BASOS ABS: 0 10*3/uL (ref 0.0–0.2)
BASOS: 0 %
EOS (ABSOLUTE): 0.2 10*3/uL (ref 0.0–0.4)
Eos: 4 %
Hematocrit: 39.5 % (ref 34.0–46.6)
Hemoglobin: 13.1 g/dL (ref 11.1–15.9)
IMMATURE GRANS (ABS): 0 10*3/uL (ref 0.0–0.1)
IMMATURE GRANULOCYTES: 0 %
LYMPHS ABS: 2.4 10*3/uL (ref 0.7–3.1)
Lymphs: 41 %
MCH: 29.4 pg (ref 26.6–33.0)
MCHC: 33.2 g/dL (ref 31.5–35.7)
MCV: 89 fL (ref 79–97)
Monocytes Absolute: 0.4 10*3/uL (ref 0.1–0.9)
Monocytes: 7 %
Neutrophils Absolute: 2.8 10*3/uL (ref 1.4–7.0)
Neutrophils: 48 %
Platelets: 223 10*3/uL (ref 150–379)
RBC: 4.45 x10E6/uL (ref 3.77–5.28)
RDW: 14.9 % (ref 12.3–15.4)
WBC: 5.9 10*3/uL (ref 3.4–10.8)

## 2016-02-05 ENCOUNTER — Telehealth: Payer: Self-pay | Admitting: *Deleted

## 2016-02-05 NOTE — Telephone Encounter (Signed)
Labs collected 01/29/16: JCV 0.15 negative

## 2016-02-26 ENCOUNTER — Other Ambulatory Visit: Payer: Self-pay | Admitting: Neurology

## 2016-02-27 ENCOUNTER — Other Ambulatory Visit: Payer: Self-pay | Admitting: Neurology

## 2016-05-07 ENCOUNTER — Other Ambulatory Visit: Payer: Self-pay | Admitting: Neurology

## 2016-05-16 ENCOUNTER — Other Ambulatory Visit: Payer: Self-pay | Admitting: Neurology

## 2016-06-09 ENCOUNTER — Other Ambulatory Visit: Payer: Self-pay | Admitting: Neurology

## 2016-07-10 ENCOUNTER — Ambulatory Visit: Payer: Commercial Managed Care - HMO | Admitting: Neurology

## 2016-09-01 ENCOUNTER — Other Ambulatory Visit: Payer: Self-pay | Admitting: Neurology

## 2016-09-11 ENCOUNTER — Ambulatory Visit (INDEPENDENT_AMBULATORY_CARE_PROVIDER_SITE_OTHER): Payer: Commercial Managed Care - HMO | Admitting: Neurology

## 2016-09-11 ENCOUNTER — Encounter: Payer: Self-pay | Admitting: Neurology

## 2016-09-11 DIAGNOSIS — M542 Cervicalgia: Secondary | ICD-10-CM

## 2016-09-11 DIAGNOSIS — G43009 Migraine without aura, not intractable, without status migrainosus: Secondary | ICD-10-CM

## 2016-09-11 DIAGNOSIS — R208 Other disturbances of skin sensation: Secondary | ICD-10-CM | POA: Diagnosis not present

## 2016-09-11 DIAGNOSIS — R269 Unspecified abnormalities of gait and mobility: Secondary | ICD-10-CM

## 2016-09-11 DIAGNOSIS — G35 Multiple sclerosis: Secondary | ICD-10-CM

## 2016-09-11 DIAGNOSIS — R5383 Other fatigue: Secondary | ICD-10-CM

## 2016-09-11 DIAGNOSIS — F418 Other specified anxiety disorders: Secondary | ICD-10-CM | POA: Diagnosis not present

## 2016-09-11 DIAGNOSIS — G35D Multiple sclerosis, unspecified: Secondary | ICD-10-CM

## 2016-09-11 MED ORDER — DULOXETINE HCL 60 MG PO CPEP: 120.0000 mg | ORAL_CAPSULE | Freq: Every day | ORAL | 11 refills | Status: DC

## 2016-09-11 MED ORDER — CELECOXIB 200 MG PO CAPS: 200.0000 mg | ORAL_CAPSULE | Freq: Every day | ORAL | 11 refills | Status: DC

## 2016-09-11 MED ORDER — VERAPAMIL HCL ER 180 MG PO TBCR: 180.0000 mg | EXTENDED_RELEASE_TABLET | Freq: Every day | ORAL | 11 refills | Status: DC

## 2016-09-11 MED ORDER — CLONAZEPAM 0.5 MG PO TABS: 0.5000 mg | ORAL_TABLET | Freq: Two times a day (BID) | ORAL | 5 refills | Status: DC | PRN

## 2016-09-11 NOTE — Progress Notes (Signed)
[facility]  PATIENT: Joan Ray DOB: 1961-03-07  REFERRING DOCTOR OR PCP:  Melina Schools [phone]) [name] (PCP) SOURCE: Patient, MRI images on PACS and CD and notes from PCP and ortho  _________________________________   HISTORICAL  CHIEF COMPLAINT:  Chief Complaint  Patient presents with  . Multiple Sclerosis    Sts. she continues to tolerate [place] well.  JCV ab last checked 01-29-16 and was negative at 0.15.  She would like rx's for brand name Celebrex, Klonopin, Cymbalta and Nexium.  Generics of these meds are no longer covered by  her insurance; she feels brand name works best for her and is willing to pay out of pocket for them/fim    HISTORY OF PRESENT ILLNESS:  Joan Ray is a 56 year old right-handed patient with MS diagnosed in 2010.     MS:   She tolerates Tysabri well and has no exacerbations on it.  She has been JCV Ab negative.  MRI 09/19/2015 showed no definite new lesions compared to 07/02/2010.  Pain:  She is having more migraine pain and pain meds.   Insurance won't cover brand name Norvasc and she felt amlodipine did not help as much,       She also has neck pain and back pain.   Also more knee pain.     Insurance won't cover name brand Celebrex  Gait/strength: She feels her gait is currently doing well.   No recent falls.  She denies any weakness now. She does have some tingling in the feet and hands.  No ataxia.     Bladder: She reports urinary urgency and frequency at times.  She does not note any significant problems with bladder function.   No incontinence.     Vision: She had a good recovery from the optic neuritis. She denies any significant problems with her vision now. She does wear glasses. There'll be some fluctuations in her vision at times.  Fatigue/sleep: She reports physical and cognitive fatigue, worse with heat or after working.    She   has insomnia, but falls asleep easily now with melatonin and  clonazepam (often needs 0.5 mg x 2).    RLS symptoms are much better on clonazepam and Lyrica. A sleep study in the past did not show sleep apnea but did show periodic limb movements of sleep.  Mood/cognition: She has mild depression and anxiety and has been helped by Cymbalta.   She was switched to generic Cymbalta but it has not helped as much as the brand name.  . She notes mild cognitive issues.    Shehas poor attention and is easily distracted.    She has mild difficulty with executive function.    She has not had formal neurocognitive testing.    Migraine:   She has had migraine headaches and was on Norvasc with benefit However, it was switched to generic and migraines worsened.    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     Spine/limb pain history:    She reports having  L5-S1 fusion in October 2014 and C5-C7 fusion June 2016.   Although some symptoms improved after surgery, she continues to have a lot of difficulties with neck pain, arm pain, back pain and leg pain.    She was placed on Lyrica for her pain and was titrated to 200 mg twice a day but could  not tolerate that dose. She has since been reduced to 100 mg by mouth twice a day and tolerates that dose but continues to have pain.      She reports some axial neck and lower back pain but the dysesthetic pain in the hands and feet is more troublesome.    Both hands have the dysesthetic pain but in the legs, the right foot is much worse than the left.   She gets some benefit from Lyrica.   She felt her pain had been better on Cymbalta than on Effexor. However, in 2013 when she was having more neurologic issues the Cymbalta was discontinued.   In the right foot, the pain is most intense on the top of the foot and towards the ankle but not above the ankle.    I personally reviewed the MRI of the cervical spine from 10/07/2014.   It shows degenerative changes at  C5-C6 resulting in left foraminal narrowing and changes at C6-C7 resulting right foraminal narrowing. The spinal cord appears normal. Plain films done after surgery showed a C5-C7 fusion and lumbar plain films showed the L5-S1 fusion.  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 She also had pain in the left eye. She went to an ophthalmologist who did some tests and referred her or an MRI. The MRI was abnormal, consistent with MS and she was referred to Dr. Maye Hides.   Usually, she was placed on weekly Avonex injections. She did not like the way that she felt on the injections. Additionally, an MRI of the brain showed that she had 2 new lesions. Therefore, she was switched to Tysabri in 2011.     On Tysabri, she has had no definite exacerbations, no change in MRI. Additionally she tolerates the medication very well. She has had some low positive JCV antibody titers. We do not have the actual numbers.    She switched from Dr. Starleen Blue to Dr. William Hamburger in [place] in 2013. She is interested in changing to an [place].    Last MRI was early 2016 at [facility] in [place].     REVIEW OF SYSTEMS: Constitutional: No fevers, chills, sweats, or change in appetite.    She has insomnia and restless leg syndrome she reports a lot of fatigue. Eyes: No visual changes, double vision, eye pain Ear, nose and throat: No hearing loss, ear pain, nasal congestion, sore throat Cardiovascular: No chest pain, palpitations Respiratory: No shortness of breath at rest or with exertion.   No wheezes GastrointestinaI: No nausea, vomiting, diarrhea, abdominal pain, fecal incontinence Genitourinary: No dysuria, urinary retention or frequency.  No nocturia. Musculoskeletal: as above Integumentary: No rash, pruritus, skin lesions Neurological: as above Psychiatric: Notes depression > anxiety Endocrine: No palpitations, diaphoresis, change in appetite, change in weigh  or increased thirst Hematologic/Lymphatic: No anemia, purpura, petechiae. Allergic/Immunologic: No itchy/runny eyes, nasal congestion, recent allergic reactions, rashes  ALLERGIES: Allergies  Allergen Reactions  . Hydrocodone-Acetaminophen Itching  . Progesterone Other (See Comments) and Rash    increase/profuse sweating-flushing-  . Sertraline Hcl Other (See Comments)  . Statins Swelling    muscle and joint pain/tightness  . Betadine [Povidone Iodine]     Blisters  . Codeine Itching    Rash  . Darvocet [Propoxyphene N-Acetaminophen] Nausea And Vomiting  . Diazepam     Increases agitation  . Dilaudid [Hydromorphone Hcl] Itching  . Doxycycline     Facial flushing  .  Keppra [Levetiracetam]     Shaking, nervousness, palpations and flushing  . Lamictal [Lamotrigine]     Causes tongue and mouth to tingle  . Lipitor [Atorvastatin]     Severe muscle and joint pain / ms relapse  . Lyrica [Pregabalin] Other (See Comments)    Doses over 300mg  decrease memory  . Neurontin [Gabapentin]     Impaired cognition, slower motor skills (intolerant)  . Other     EDTA causes blisters  . Pravastatin Other (See Comments)    Severe joint pain MS relapse  . Sertraline Other (See Comments)    Increased nervousness  . Topamax [Topiramate]     Confusion  . Zonisamide     Back pain, headaches, insomnia, chest pressure  . Cephalosporins Rash  . Erythromycin Itching and Rash  . Keflex [Cephalexin] Rash  . Vancomycin Rash    Pt stated, " I can't tolerate Vancomycin I get a red rash."    HOME MEDICATIONS:  Current Outpatient Prescriptions:  .  celecoxib (CELEBREX) 200 MG capsule, Take 1 capsule (200 mg total) by mouth daily. BRAND NAME CELEBREX MEDICALLY NECESSARY, Disp: 30 capsule, Rfl: 11 .  cetirizine (ZYRTEC) 10 MG tablet, Take 10 mg by mouth daily., Disp: , Rfl:  .  clonazePAM (KLONOPIN) 0.5 MG tablet, Take 1 tablet (0.5 mg total) by mouth 2 (two) times daily as needed for anxiety., Disp:  60 tablet, Rfl: 5 .  cyclobenzaprine (FLEXERIL) 10 MG tablet, take 1 tablet by mouth three times a day if needed, Disp: 90 tablet, Rfl: 3 .  diclofenac sodium (VOLTAREN) 1 % GEL, Apply 1 application topically as needed., Disp: , Rfl:  .  docusate sodium (COLACE) 100 MG capsule, Take 100 mg by mouth 2 (two) times daily., Disp: , Rfl:  .  DULoxetine (CYMBALTA) 60 MG capsule, Take 2 capsules (120 mg total) by mouth daily. BRAND NAME CYMBALTA MEDICALLY NECESSARY, Disp: 60 capsule, Rfl: 11 .  esomeprazole (NEXIUM) 40 MG capsule, Take 40 mg by mouth at bedtime., Disp: , Rfl:  .  fenofibrate (TRICOR) 145 MG tablet, Take 145 mg by mouth at bedtime. , Disp: , Rfl:  .  lidocaine (LIDODERM) 5 %, apply 3 patches to affected area daily as directed, Disp: 90 patch, Rfl: 6 .  LYRICA 100 MG capsule, take 1 capsule by mouth twice a day, Disp: 60 capsule, Rfl: 5 .  Melatonin 10 MG CAPS, Take 10 mg by mouth at bedtime. , Disp: , Rfl:  .  natalizumab (TYSABRI) 300 MG/15ML injection, Inject 300 mg into the vein every 30 (thirty) days. Takes for MS, Disp: , Rfl:  .  rizatriptan (MAXALT) 10 MG tablet, Take 10 mg by mouth every 2 (two) hours as needed for migraine. May repeat in 2 hours if needed, Disp: , Rfl:  .  buPROPion (WELLBUTRIN XL) 150 MG 24 hr tablet, Take by mouth., Disp: , Rfl:  .  cholecalciferol (VITAMIN D) 1000 units tablet, Take 5,000 Units by mouth daily., Disp: , Rfl:  .  metFORMIN (GLUCOPHAGE-XR) 500 MG 24 hr tablet, , Disp: , Rfl: 0 .  oxyCODONE-acetaminophen (PERCOCET) 10-325 MG tablet, Take 1 tablet by mouth every 8 (eight) hours as needed for pain. (Patient not taking: Reported on 09/11/2016), Disp: 60 tablet, Rfl: 0 .  verapamil (CALAN-SR) 180 MG CR tablet, Take 1 tablet (180 mg total) by mouth at bedtime., Disp: 30 tablet, Rfl: 11 No current facility-administered medications for this visit.   Facility-Administered Medications Ordered in Other Visits:  .  gadopentetate dimeglumine (MAGNEVIST)  injection 20 mL, 20 mL, Intravenous, Once PRN, Britt Bottom, MD  PAST MEDICAL HISTORY: Past Medical History:  Diagnosis Date  . Anemia    h/o of   Last infusion of iron  2008  . Anxiety   . Arthritis    osteo   . Complication of anesthesia   . Constipation   . Depression    d/t decreased activity  . GERD (gastroesophageal reflux disease)   . Headache(784.0)    migraines  . High triglycerides   . MS (multiple sclerosis) ([place])   . Neuromuscular disorder ([place])    mutliple sclerosis--dx 10/2008  . PONV (postoperative nausea and vomiting)   . Restless leg syndrome   . Wears glasses   . Wears glasses     PAST SURGICAL HISTORY: Past Surgical History:  Procedure Laterality Date  . ANTERIOR CERVICAL DECOMP/DISCECTOMY FUSION N/A 12/21/2014   Procedure: ANTERIOR CERVICAL DECOMPRESSION/DISCECTOMY FUSION 2 LEVELS C5-7;  Surgeon: Melina Schools, MD;  Location: [facility];  Service: Orthopedics;  Laterality: N/A;  . BLADDER SUSPENSION     occas  blood in urine  . ENDOMETRIAL ABLATION  2010  . FOOT ARTHRODESIS Right 07/28/2013   Procedure: ARTHRODESIS RIGHT MIDFOOT OF THE FIRST -  THIRD TARSOMETATARSAL JOINTS ;  Surgeon: Wylene Simmer, MD;  Location: [facility];  Service: Orthopedics;  Laterality: Right;  . GASTROCNEMIUS RECESSION Right 07/28/2013   Procedure: RIGHT GASTROCNEMIUS RECESSION;  Surgeon: Wylene Simmer, MD;  Location: [facility];  Service: Orthopedics;  Laterality: Right;  . JOINT REPLACEMENT  2012   lt total knee  . KNEE ARTHROSCOPY     bilateral  in 2010, has multiple scopes  . MEDIAL PARTIAL KNEE REPLACEMENT     right  . nerve blocks    . SPINAL FUSION  05-05-2013  . stress fracture     right 2-3 toe  . TOTAL KNEE ARTHROPLASTY  2008   partial rt     FAMILY HISTORY: Family History  Problem Relation Age of Onset  . Heart disease Mother   . Cancer Mother   . Hypertension Mother   . Depression Mother   . Heart disease Other   . Hypertension  Maternal Grandmother   . Diabetes Maternal Grandmother   . Kidney disease Maternal Grandmother   . Rheum arthritis Maternal Grandmother   . Depression Maternal Grandmother   . Hypertension Paternal Grandmother   . Kidney disease Maternal Grandfather   . Depression Maternal Grandfather   . Depression Brother     SOCIAL HISTORY:  Social History   Social History  . Marital status: Married    Spouse name: N/A  . Number of children: 3  . Years of education: N/A   Occupational History  . Not on file.   Social History Main Topics  . Smoking status: Never Smoker  . Smokeless tobacco: Never Used  . Alcohol use Yes     Comment: socially--wine & beer  . Drug use: No  . Sexual activity: Not on file   Other Topics Concern  . Not on file   Social History Narrative   Lives at home with   Caffeine use:      PHYSICAL EXAM  There were no vitals filed for this visit.  There is no height or weight on file to calculate BMI.   General: The patient is well-developed and well-nourished and in no acute distress  Neurologic Exam  Mental status: The patient is alert and  oriented x 3 at the time of the examination. The patient has apparent normal recent and remote memory, with an apparently normal attention span and concentration ability.   Speech is normal.  Cranial nerves: Extraocular movements are full. There is good facial sensation to soft touch bilaterally.Facial strength is normal.  Trapezius and sternocleidomastoid strength is normal. No dysarthria is noted.  The tongue is midline, and the patient has symmetric elevation of the soft palate. No obvious hearing deficits are noted.  Motor:  Muscle bulk is normal.   Tone is normal. Strength is  5 / 5 in all 4 extremities x 4+/5 right EHL  Sensory: Sensory testing is intact to pinprick, soft touch and vibration sensation in arms.  Allodynia and reduced sensation to touch in an L5 distribution of the right foot. Sensation was normal  in the left foot..  Coordination: Cerebellar testing reveals good finger-nose-finger and heel-to-shin bilaterally.  Gait and station: Station is normal.   Gait is mildly wide. Tandem gait is wide. She is mildly stiff. Romberg is negative.   Reflexes: Deep tendon reflexes are symmetric and normal bilaterally.          DIAGNOSTIC DATA (LABS, IMAGING, TESTING) - I reviewed patient records, labs, notes, testing and imaging myself where available.  Lab Results  Component Value Date   WBC 5.9 01/29/2016   HGB 12.4 12/07/2014   HCT 39.5 01/29/2016   MCV 89 01/29/2016   PLT 223 01/29/2016      Component Value Date/Time   NA 140 12/07/2014 0918   K 4.3 12/07/2014 0918   CL 106 12/07/2014 0918   CO2 26 12/07/2014 0918   GLUCOSE 106 (H) 12/07/2014 0918   BUN 16 12/07/2014 0918   CREATININE 0.79 12/07/2014 0918   CALCIUM 9.2 12/07/2014 0918   PROT 6.8 04/27/2013 0916   ALBUMIN 4.0 04/27/2013 0916   AST 19 04/27/2013 0916   ALT 15 04/27/2013 0916   ALKPHOS 64 04/27/2013 0916   BILITOT 0.4 04/27/2013 0916   GFRNONAA >60 12/07/2014 0918   GFRAA >60 12/07/2014 0918      ASSESSMENT AND PLAN  Multiple sclerosis ([place]) - Plan: Stratify JCV Antibody Test ([facility]), CBC with Differential/Platelet, MR BRAIN W WO CONTRAST  Common migraine without intractability  Depression with anxiety  Dysesthesia - Plan: MR BRAIN W WO CONTRAST  Gait disturbance - Plan: MR BRAIN W WO CONTRAST  Neck pain  Other fatigue   1.   Continue Tysabri. We will check JCV antibody and CBC today   Check MRI of the brain to determine if there has been any subclinical progression. If she converts from low to high positive JCV AB or if any clinical progression, we would need to consider a different DMT for her MS,      2.  Continue medications.    I will write for brand necessary Cymbalta as she did better with the brand of the generic. I'll have her try verapamil instead of Norvasc for the migraines. She also  felt she did better on brand necessary Celebrex for her pain. 3.   She is advised to stay active and exercises as tolerated.  Reurn in 5-6 months orsooner if there are new or worsening neurologic symptoms.  40 minute face to face visit with > 1/2 the time counseling or coordinating care about he MS, symptoms and pain issues.  Chalonda Schlatter A. [name], MD, PhD 01/08/1699, 1:74 PM Certified in Neurology, Clinical Neurophysiology, Sleep Medicine, Pain Medicine and Neuroimaging  [facility] [address] [place],  [address] [phone]

## 2016-09-12 LAB — CBC WITH DIFFERENTIAL/PLATELET
BASOS ABS: 0 10*3/uL (ref 0.0–0.2)
BASOS: 0 %
EOS (ABSOLUTE): 0.4 10*3/uL (ref 0.0–0.4)
Eos: 6 %
Hematocrit: 37.7 % (ref 34.0–46.6)
Hemoglobin: 12.4 g/dL (ref 11.1–15.9)
Immature Grans (Abs): 0 10*3/uL (ref 0.0–0.1)
Immature Granulocytes: 0 %
LYMPHS ABS: 3.1 10*3/uL (ref 0.7–3.1)
Lymphs: 45 %
MCH: 27.4 pg (ref 26.6–33.0)
MCHC: 32.9 g/dL (ref 31.5–35.7)
MCV: 83 fL (ref 79–97)
Monocytes Absolute: 0.4 10*3/uL (ref 0.1–0.9)
Monocytes: 5 %
NEUTROS ABS: 3 10*3/uL (ref 1.4–7.0)
Neutrophils: 44 %
PLATELETS: 201 10*3/uL (ref 150–379)
RBC: 4.53 x10E6/uL (ref 3.77–5.28)
RDW: 15.4 % (ref 12.3–15.4)
WBC: 6.9 10*3/uL (ref 3.4–10.8)

## 2016-09-17 ENCOUNTER — Other Ambulatory Visit: Payer: Self-pay | Admitting: Neurology

## 2016-09-19 ENCOUNTER — Encounter: Payer: Self-pay | Admitting: *Deleted

## 2016-09-21 ENCOUNTER — Other Ambulatory Visit: Payer: Self-pay | Admitting: Neurology

## 2016-09-25 ENCOUNTER — Encounter: Payer: Self-pay | Admitting: Neurology

## 2016-09-25 ENCOUNTER — Ambulatory Visit
Admission: RE | Admit: 2016-09-25 | Discharge: 2016-09-25 | Disposition: A | Discharge status: Home / Self Care | Payer: Commercial Managed Care - HMO | Source: Ambulatory Visit | Attending: Neurology | Admitting: Neurology

## 2016-09-25 DIAGNOSIS — R208 Other disturbances of skin sensation: Secondary | ICD-10-CM

## 2016-09-25 DIAGNOSIS — G35 Multiple sclerosis: Secondary | ICD-10-CM

## 2016-09-25 DIAGNOSIS — R269 Unspecified abnormalities of gait and mobility: Secondary | ICD-10-CM

## 2016-09-25 MED ORDER — GADOBENATE DIMEGLUMINE 529 MG/ML IV SOLN
20.0000 mL | Freq: Once | INTRAVENOUS | Status: AC | PRN
  Administered 2016-09-25: 20 mL via INTRAVENOUS

## 2016-10-01 ENCOUNTER — Telehealth: Payer: Self-pay | Admitting: *Deleted

## 2016-10-01 NOTE — Telephone Encounter (Signed)
Cymbalta PA completed via Cover My Meds.  Key: [id number].  Pt. feels mood is more stable on brand name Cymbalta.  Has not done as well on Duloxetine.  Has tried and failed Effexor and Wellbutrin. 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 Key: [id number].  Pt. feels she receives superior pain relief with brand name Celebrex.  Has tried and failed: Flexeril, Voltaren gel, Lidoderm patches, Oxycodone 5, 10 and 15mg , Tylenol, Lyrica. 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

## 2016-10-03 ENCOUNTER — Telehealth: Payer: Self-pay | Admitting: Neurology

## 2016-10-03 NOTE — Telephone Encounter (Signed)
I discussed the JCV antibody titer was low positive. In the past it was low positive at least one other time but was negative at the July 2017 test. We will recheck again in 6 months and consider a different medication if she goes from low positive to high positive.  She reports more headaches since she was switched to the generic Norvasc.

## 2016-10-04 ENCOUNTER — Encounter: Payer: Self-pay | Admitting: Neurology

## 2016-10-09 ENCOUNTER — Telehealth: Payer: Self-pay | Admitting: *Deleted

## 2016-10-09 MED ORDER — NORVASC 10 MG PO TABS: 10.0000 mg | ORAL_TABLET | Freq: Every day | ORAL | 3 refills | Status: DC

## 2016-10-09 NOTE — Telephone Encounter (Signed)
Per RAS, ok for brand name Norvasc 10mg  one po daily, mailed to pt's address as requested/fim

## 2016-11-05 ENCOUNTER — Encounter: Payer: Self-pay | Admitting: Neurology

## 2016-11-07 ENCOUNTER — Encounter: Payer: Self-pay | Admitting: *Deleted

## 2016-11-07 NOTE — Telephone Encounter (Signed)
Appeal letters for Cymbalta and Celebrex faxed to [facility] fax# [phone]/fim

## 2016-11-10 NOTE — Telephone Encounter (Signed)
Received faxes from [facility].  Brand name Celebrex approved, brand name Cymbalta denied.  MyChart message sent to pt. to make her aware/fim

## 2016-12-17 ENCOUNTER — Encounter: Payer: Self-pay | Admitting: Neurology

## 2016-12-18 ENCOUNTER — Telehealth: Payer: Self-pay | Admitting: Neurology

## 2016-12-18 MED ORDER — CELECOXIB 200 MG PO CAPS: 200.0000 mg | ORAL_CAPSULE | Freq: Two times a day (BID) | ORAL | 1 refills | Status: DC

## 2016-12-18 MED ORDER — PREGABALIN 150 MG PO CAPS: 150.0000 mg | ORAL_CAPSULE | Freq: Two times a day (BID) | ORAL | 1 refills | Status: DC

## 2016-12-18 NOTE — Telephone Encounter (Signed)
I have spoken with [facility] and clarified rx. is for Lyrica 150mg  po bid #60 with 1 r/f/fim

## 2016-12-18 NOTE — Telephone Encounter (Signed)
Collett from [facility] ([place] something happened to the prescription , there is no signature and the quantity does not show, please resend.  She can be reached [phone]

## 2016-12-19 ENCOUNTER — Telehealth: Payer: Self-pay | Admitting: *Deleted

## 2016-12-19 NOTE — Telephone Encounter (Signed)
[facility], spoke with Leveda Anna, pharmacist. She stated Lyrica Rx was received yesterday. Pharmacy had to order medication. She stated medication came in, Rx for pt will be filled this afternoon. Will reply to patient 's my chart message to let her know.

## 2017-01-13 ENCOUNTER — Encounter: Payer: Self-pay | Admitting: *Deleted

## 2017-01-27 ENCOUNTER — Encounter: Payer: Self-pay | Admitting: Neurology

## 2017-01-28 ENCOUNTER — Telehealth: Payer: Self-pay | Admitting: *Deleted

## 2017-01-28 MED ORDER — PREGABALIN 100 MG PO CAPS: 100.0000 mg | ORAL_CAPSULE | Freq: Two times a day (BID) | ORAL | 1 refills | Status: DC

## 2017-01-28 NOTE — Telephone Encounter (Signed)
See pt. email.  Pt. requested to decrease Lyrica from 150mg  bid to 100mg  bid, due to excessive drowsiness.  Rx. faxed to [facility]/fim

## 2017-03-05 ENCOUNTER — Telehealth: Payer: Self-pay | Admitting: *Deleted

## 2017-03-05 NOTE — Telephone Encounter (Signed)
FMLA forms completed and sent to [employer] Records to call pt.,  collect form fee, fax forms in/fim

## 2017-03-11 NOTE — Telephone Encounter (Signed)
Left message on patients VM that FMLA forms are completed and we just need to receive her $50 fee.  Forms will be faxed to [facility] at [phone].

## 2017-03-12 ENCOUNTER — Encounter: Payer: Self-pay | Admitting: Neurology

## 2017-03-12 ENCOUNTER — Ambulatory Visit (INDEPENDENT_AMBULATORY_CARE_PROVIDER_SITE_OTHER): Payer: 59 | Admitting: Neurology

## 2017-03-12 VITALS — BP 134/76 | HR 86 | Resp 18 | Ht 68.0 in | Wt 264.5 lb

## 2017-03-12 DIAGNOSIS — G35 Multiple sclerosis: Secondary | ICD-10-CM | POA: Diagnosis not present

## 2017-03-12 DIAGNOSIS — G4700 Insomnia, unspecified: Secondary | ICD-10-CM

## 2017-03-12 DIAGNOSIS — R208 Other disturbances of skin sensation: Secondary | ICD-10-CM

## 2017-03-12 DIAGNOSIS — G2581 Restless legs syndrome: Secondary | ICD-10-CM

## 2017-03-12 DIAGNOSIS — M79642 Pain in left hand: Secondary | ICD-10-CM | POA: Diagnosis not present

## 2017-03-12 DIAGNOSIS — G43009 Migraine without aura, not intractable, without status migrainosus: Secondary | ICD-10-CM | POA: Diagnosis not present

## 2017-03-12 DIAGNOSIS — R269 Unspecified abnormalities of gait and mobility: Secondary | ICD-10-CM | POA: Diagnosis not present

## 2017-03-12 DIAGNOSIS — M542 Cervicalgia: Secondary | ICD-10-CM

## 2017-03-12 MED ORDER — CYCLOBENZAPRINE HCL 10 MG PO TABS: ORAL_TABLET | ORAL | 5 refills | Status: DC

## 2017-03-12 MED ORDER — TRAMADOL HCL 50 MG PO TABS: 50.0000 mg | ORAL_TABLET | Freq: Four times a day (QID) | ORAL | 1 refills | Status: DC | PRN

## 2017-03-12 MED ORDER — CLONAZEPAM 0.5 MG PO TABS: 0.5000 mg | ORAL_TABLET | Freq: Two times a day (BID) | ORAL | 5 refills | Status: DC | PRN

## 2017-03-12 NOTE — Progress Notes (Signed)
[facility]  PATIENT: LEAR CARSTENS DOB: March 30, 1961  REFERRING DOCTOR OR PCP:  Melina Schools [phone]) [name] (PCP) SOURCE: Patient, MRI images on PACS and CD and notes from PCP and ortho  _________________________________   HISTORICAL  CHIEF COMPLAINT:  Chief Complaint  Patient presents with  . Multiple Sclerosis    Tysabri. JCV ab last checked 09/11/16 and was positive at 0.76/fim    HISTORY OF PRESENT ILLNESS:  Eugenia Eldredge is a 56 year old right-handed patient with MS diagnosed in 2010.     MS:   She is on Tysabri and tolerates it well..  She has been JCV Ab negative.  MRI 09/19/2015 showed no definite new lesions compared to 07/02/2010.  Migraine:  She reports migraines did better on brand name Norvasc than on amlodipine.      Neck/back pain/CTS:   She also has neck pain and back pain.  She also has carpal tunnel syndrome (on EMG by Dr. Starleen Blue but Dr. Nelva Bush felt due to shoulder impingement (by her report) Also more knee pain.     Insurance won't cover name brand Celebrex.  Other:   She is undergoing evaluation for bariatric surgery but stomach polyps were noted so this is on hold.  She is exercising and taking phentermine but weight is not improving.    Gait/strength: Gait is stable. She occasionally stumbles but has no falls..  No weakness and no numbness in legs.    Bladder: She has urinary frequency and urgency. This is mild.   No incontinence.     Vision: She had a good recovery from the optic neuritis. She denies any significant problems with her vision now. She does wear glasses. There'll be some fluctuations in her vision at times.  Fatigue/sleep: She has fatigue and also notes excessive sweating.    She has insomnia, but with melatonin and clonazepam (often needs 0.5 mg x 2) she falls asleep easily but wakes up and then can't fall back asleep.    RLS symptoms are much better on clonazepam and Lyrica. A sleep study in the past did  not show sleep apnea but did show periodic limb movements of sleep.    GERD is a problem at night.    Mood/cognition: She has mild depression and anxiety and has been helped by Cymbalta.   She was switched to generic Cymbalta but it has not helped as much as the brand name.  . She notes mild cognitive issues.    Shehas poor attention and is easily distracted.    She has mild difficulty with executive function.    She has not had formal neurocognitive testing.    Migraine:   She has had migraine headaches and was on Norvasc with benefit However, it was switched to generic and migraines worsened.     Spine/limb pain history:    She reports having  L5-S1 fusion in October 2014 and C5-C7 fusion June 2016.   Although some symptoms improved after surgery, she continues to have a lot of difficulties with neck pain, arm pain, back pain and leg pain.    She was placed on Lyrica for her pain and was titrated to 200 mg twice a day but could not tolerate that dose. She has since been reduced to 100 mg by mouth twice a day and tolerates that dose but continues to have pain.      She reports some axial neck and lower back pain but the dysesthetic pain in the hands and  feet is more troublesome.    Both hands have the dysesthetic pain but in the legs, the right foot is much worse than the left.   She gets some benefit from Lyrica.   She felt her pain had been better on Cymbalta than on Effexor. However, in 2013 when she was having more neurologic issues the Cymbalta was discontinued.   In the right foot, the pain is most intense on the top of the foot and towards the ankle but not above the ankle.    I personally reviewed the MRI of the cervical spine from 10/07/2014.   It shows degenerative changes at C5-C6 resulting in left foraminal narrowing and changes at C6-C7 resulting right foraminal narrowing. The spinal cord appears normal. Plain films done after surgery showed a C5-C7 fusion and lumbar plain films showed the L5-S1  fusion.  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 She also had pain in the left eye. She went to an ophthalmologist who did some tests and referred her or an MRI. The MRI was abnormal, consistent with MS and she was referred to Dr. Maye Hides.   Usually, she was placed on weekly Avonex injections. She did not like the way that she felt on the injections. Additionally, an MRI of the brain showed that she had 2 new lesions. Therefore, she was switched to Tysabri in 2011.     On Tysabri, she has had no definite exacerbations, no change in MRI. Additionally she tolerates the medication very well. She has had some low positive JCV antibody titers. We do not have the actual numbers.    She switched from Dr. Starleen Blue to Dr. William Hamburger in [place] in 2013. She is interested in changing to an [place].    Last MRI was early 2016 at [facility] in [place].     REVIEW OF SYSTEMS: Constitutional: No fevers, chills, sweats, or change in appetite.    She has insomnia and restless leg syndrome she reports a lot of fatigue. Eyes: No visual changes, double vision, eye pain Ear, nose and throat: No hearing loss, ear pain, nasal congestion, sore throat Cardiovascular: No chest pain, palpitations Respiratory: No shortness of breath at rest or with exertion.   No wheezes GastrointestinaI: No nausea, vomiting, diarrhea, abdominal pain, fecal incontinence Genitourinary: No dysuria, urinary retention or frequency.  No nocturia. Musculoskeletal: as above Integumentary: No rash, pruritus, skin lesions Neurological: as above Psychiatric: Notes depression > anxiety Endocrine: No palpitations, diaphoresis, change in appetite, change in weigh or increased thirst Hematologic/Lymphatic: No anemia, purpura, petechiae. Allergic/Immunologic: No itchy/runny eyes, nasal congestion, recent allergic reactions, rashes  ALLERGIES: Allergies  Allergen Reactions  .  Hydrocodone-Acetaminophen Itching  . Progesterone Other (See Comments) and Rash    increase/profuse sweating-flushing-  . Sertraline Hcl Other (See Comments)  . Statins Swelling    muscle and joint pain/tightness  . Betadine [Povidone Iodine]     Blisters  . Codeine Itching    Rash  . Darvocet [Propoxyphene N-Acetaminophen] Nausea And Vomiting  . Diazepam     Increases agitation  . Dilaudid [Hydromorphone Hcl] Itching  . Doxycycline     Facial flushing  . Keppra [Levetiracetam]     Shaking, nervousness, palpations and flushing  . Lamictal [Lamotrigine]     Causes tongue and mouth to tingle  . Lipitor [Atorvastatin]     Severe muscle and joint pain / ms relapse  . Lyrica [Pregabalin] Other (See Comments)  Doses over 300mg  decrease memory  . Neurontin [Gabapentin]     Impaired cognition, slower motor skills (intolerant)  . Other     EDTA causes blisters  . Oxycodone Hcl Itching  . Pravastatin Other (See Comments)    Severe joint pain MS relapse  . Sertraline Other (See Comments)    Increased nervousness  . Topamax [Topiramate]     Confusion  . Zonisamide     Back pain, headaches, insomnia, chest pressure  . Cephalosporins Rash  . Erythromycin Itching and Rash  . Keflex [Cephalexin] Rash  . Vancomycin Rash    Pt stated, " I can't tolerate Vancomycin I get a red rash."    HOME MEDICATIONS:  Current Outpatient Prescriptions:  .  celecoxib (CELEBREX) 200 MG capsule, Take 1 capsule (200 mg total) by mouth 2 (two) times daily., Disp: 180 capsule, Rfl: 1 .  cetirizine (ZYRTEC) 10 MG tablet, Take 10 mg by mouth daily., Disp: , Rfl:  .  clonazePAM (KLONOPIN) 0.5 MG tablet, Take 1 tablet (0.5 mg total) by mouth 2 (two) times daily as needed for anxiety., Disp: 60 tablet, Rfl: 5 .  cyclobenzaprine (FLEXERIL) 10 MG tablet, take 1 tablet by mouth three times a day if needed, Disp: 90 tablet, Rfl: 5 .  diclofenac sodium (VOLTAREN) 1 % GEL, Apply 1 application topically as  needed., Disp: , Rfl:  .  docusate sodium (COLACE) 100 MG capsule, Take 100 mg by mouth 2 (two) times daily., Disp: , Rfl:  .  DULoxetine (CYMBALTA) 60 MG capsule, Take 2 capsules (120 mg total) by mouth daily. BRAND NAME CYMBALTA MEDICALLY NECESSARY, Disp: 60 capsule, Rfl: 11 .  esomeprazole (NEXIUM) 40 MG capsule, Take 40 mg by mouth at bedtime., Disp: , Rfl:  .  hydrochlorothiazide (MICROZIDE) 12.5 MG capsule, Take 12.5 mg by mouth daily., Disp: , Rfl:  .  lidocaine (LIDODERM) 5 %, apply 3 patches to affected area daily as directed, Disp: 90 patch, Rfl: 6 .  Melatonin 10 MG CAPS, Take 10 mg by mouth at bedtime. , Disp: , Rfl:  .  metFORMIN (GLUCOPHAGE-XR) 500 MG 24 hr tablet, , Disp: , Rfl: 0 .  natalizumab (TYSABRI) 300 MG/15ML injection, Inject 300 mg into the vein every 30 (thirty) days. Takes for MS, Disp: , Rfl:  .  NORVASC 10 MG tablet, Take 1 tablet (10 mg total) by mouth daily., Disp: 90 tablet, Rfl: 3 .  phentermine (ADIPEX-P) 37.5 MG tablet, Take 37.5 mg by mouth daily before breakfast., Disp: , Rfl:  .  pregabalin (LYRICA) 100 MG capsule, Take 1 capsule (100 mg total) by mouth 2 (two) times daily., Disp: 60 capsule, Rfl: 1 .  rizatriptan (MAXALT) 10 MG tablet, Take 10 mg by mouth every 2 (two) hours as needed for migraine. May repeat in 2 hours if needed, Disp: , Rfl:  .  verapamil (CALAN-SR) 180 MG CR tablet, Take 1 tablet (180 mg total) by mouth at bedtime., Disp: 30 tablet, Rfl: 11 .  traMADol (ULTRAM) 50 MG tablet, Take 1 tablet (50 mg total) by mouth every 6 (six) hours as needed., Disp: 30 tablet, Rfl: 1 No current facility-administered medications for this visit.   Facility-Administered Medications Ordered in Other Visits:  .  gadopentetate dimeglumine (MAGNEVIST) injection 20 mL, 20 mL, Intravenous, Once PRN, Rehana Uncapher, Nanine Means, MD  PAST MEDICAL HISTORY: Past Medical History:  Diagnosis Date  . Anemia    h/o of   Last infusion of iron  2008  . Anxiety   .  Arthritis     osteo   . Complication of anesthesia   . Constipation   . Depression    d/t decreased activity  . GERD (gastroesophageal reflux disease)   . Headache(784.0)    migraines  . High triglycerides   . MS (multiple sclerosis) ([place])   . Neuromuscular disorder ([place])    mutliple sclerosis--dx 10/2008  . PONV (postoperative nausea and vomiting)   . Restless leg syndrome   . Wears glasses   . Wears glasses     PAST SURGICAL HISTORY: Past Surgical History:  Procedure Laterality Date  . ANTERIOR CERVICAL DECOMP/DISCECTOMY FUSION N/A 12/21/2014   Procedure: ANTERIOR CERVICAL DECOMPRESSION/DISCECTOMY FUSION 2 LEVELS C5-7;  Surgeon: Melina Schools, MD;  Location: [facility];  Service: Orthopedics;  Laterality: N/A;  . BLADDER SUSPENSION     occas  blood in urine  . ENDOMETRIAL ABLATION  2010  . FOOT ARTHRODESIS Right 07/28/2013   Procedure: ARTHRODESIS RIGHT MIDFOOT OF THE FIRST -  THIRD TARSOMETATARSAL JOINTS ;  Surgeon: Wylene Simmer, MD;  Location: [facility];  Service: Orthopedics;  Laterality: Right;  . GASTROCNEMIUS RECESSION Right 07/28/2013   Procedure: RIGHT GASTROCNEMIUS RECESSION;  Surgeon: Wylene Simmer, MD;  Location: [facility];  Service: Orthopedics;  Laterality: Right;  . JOINT REPLACEMENT  2012   lt total knee  . KNEE ARTHROSCOPY     bilateral  in 2010, has multiple scopes  . MEDIAL PARTIAL KNEE REPLACEMENT     right  . nerve blocks    . SPINAL FUSION  05-05-2013  . stress fracture     right 2-3 toe  . TOTAL KNEE ARTHROPLASTY  2008   partial rt     FAMILY HISTORY: Family History  Problem Relation Age of Onset  . Heart disease Mother   . Cancer Mother   . Hypertension Mother   . Depression Mother   . Heart disease Other   . Hypertension Maternal Grandmother   . Diabetes Maternal Grandmother   . Kidney disease Maternal Grandmother   . Rheum arthritis Maternal Grandmother   . Depression Maternal Grandmother   . Hypertension Paternal Grandmother     . Kidney disease Maternal Grandfather   . Depression Maternal Grandfather   . Depression Brother     SOCIAL HISTORY:  Social History   Social History  . Marital status: Married    Spouse name: N/A  . Number of children: 3  . Years of education: N/A   Occupational History  . Not on file.   Social History Main Topics  . Smoking status: Never Smoker  . Smokeless tobacco: Never Used  . Alcohol use Yes     Comment: socially--wine & beer  . Drug use: No  . Sexual activity: Not on file   Other Topics Concern  . Not on file   Social History Narrative   Lives at home with   Caffeine use:      PHYSICAL EXAM  Vitals:   03/12/17 1504  BP: 134/76  Pulse: 86  Resp: 18  Weight: 264 lb 8 oz (120 kg)  Height: 5\' 8"  (1.727 m)    Body mass index is 40.22 kg/m.   General: The patient is well-developed and well-nourished and in no acute distress  Neurologic Exam  Mental status: The patient is alert and oriented x 3 at the time of the examination. The patient has apparent normal recent and remote memory, with an apparently normal attention span and concentration ability.  Speech is normal.  Cranial nerves: Extraocular movements are full. Facial strength and sensation is normal. Trapezius strength is normal. No dysarthria is noted.  The tongue is midline, and the patient has symmetric elevation of the soft palate. No obvious hearing deficits are noted.  Motor:  Muscle bulk is normal.   Tone is normal. Strength is  5 / 5 in all 4 extremities x 4+/5 right EHL  Sensory: Sensory testing is intact to pinprick, soft touch and vibration sensation in arms.  She has allodynia with reduced sensation to touch in L5 distribution of the right foot. Sensation was normal on the left.   Coordination: Cerebellar testing reveals good finger-nose-finger and heel-to-shin bilaterally.  Gait and station: Station is normal.   Gait is mildly wide. Tandem gait is wide. Gait is slightly spastic.  Romberg is negative.   Reflexes: Deep tendon reflexes are symmetric and normal bilaterally.          DIAGNOSTIC DATA (LABS, IMAGING, TESTING) - I reviewed patient records, labs, notes, testing and imaging myself where available.  Lab Results  Component Value Date   WBC 6.9 09/11/2016   HGB 12.4 09/11/2016   HCT 37.7 09/11/2016   MCV 83 09/11/2016   PLT 201 09/11/2016      Component Value Date/Time   NA 140 12/07/2014 0918   K 4.3 12/07/2014 0918   CL 106 12/07/2014 0918   CO2 26 12/07/2014 0918   GLUCOSE 106 (H) 12/07/2014 0918   BUN 16 12/07/2014 0918   CREATININE 0.79 12/07/2014 0918   CALCIUM 9.2 12/07/2014 0918   PROT 6.8 04/27/2013 0916   ALBUMIN 4.0 04/27/2013 0916   AST 19 04/27/2013 0916   ALT 15 04/27/2013 0916   ALKPHOS 64 04/27/2013 0916   BILITOT 0.4 04/27/2013 0916   GFRNONAA >60 12/07/2014 0918   GFRAA >60 12/07/2014 0918      ASSESSMENT AND PLAN  Multiple sclerosis ([place]) - Plan: CBC with Differential/Platelet, Stratify JCV Antibody Test ([facility])  Neck pain  Dysesthesia  Gait disturbance  Restless leg syndrome  Common migraine without intractability   1.   Continue Tysabri. We will check JCV antibody and CBC today       2.  Continue medications For her MS symptoms and migraines 3.   Hand symptoms are worsening. I will check an NCV/EMG to rule out carpal tunnel syndrome, other neuropathies and radiculopathy. 4.   She is advised to stay active and exercises as tolerated. 5.   She will return for the nerve study and I will see her then. She will also return for regular follow-up in 5-6 months, sooner if there are new or worsening neurologic symptoms.  45 minute face-to-face evaluation with greater than one half of the time counseling and coordinating care regarding her multiple sclerosis, MS symptoms, worsening symptoms of the arms and other issues.   Berit Raczkowski A. [name], MD, PhD 11/09/3873, 6:43 PM Certified in Neurology, Clinical  Neurophysiology, Sleep Medicine, Pain Medicine and Neuroimaging  [facility] [address] [place], [place] [address] [phone]

## 2017-03-13 LAB — CBC WITH DIFFERENTIAL/PLATELET
BASOS: 0 %
Basophils Absolute: 0 10*3/uL (ref 0.0–0.2)
EOS (ABSOLUTE): 0.3 10*3/uL (ref 0.0–0.4)
Eos: 4 %
HEMATOCRIT: 39.9 % (ref 34.0–46.6)
HEMOGLOBIN: 12.9 g/dL (ref 11.1–15.9)
IMMATURE GRANS (ABS): 0 10*3/uL (ref 0.0–0.1)
Immature Granulocytes: 1 %
LYMPHS: 39 %
Lymphocytes Absolute: 3.1 10*3/uL (ref 0.7–3.1)
MCH: 28.2 pg (ref 26.6–33.0)
MCHC: 32.3 g/dL (ref 31.5–35.7)
MCV: 87 fL (ref 79–97)
Monocytes Absolute: 0.4 10*3/uL (ref 0.1–0.9)
Monocytes: 6 %
NEUTROS ABS: 4 10*3/uL (ref 1.4–7.0)
Neutrophils: 50 %
PLATELETS: 209 10*3/uL (ref 150–379)
RBC: 4.57 x10E6/uL (ref 3.77–5.28)
RDW: 15.5 % — ABNORMAL HIGH (ref 12.3–15.4)
WBC: 7.9 10*3/uL (ref 3.4–10.8)

## 2017-03-16 DIAGNOSIS — Z0289 Encounter for other administrative examinations: Secondary | ICD-10-CM

## 2017-03-19 ENCOUNTER — Encounter: Payer: Self-pay | Admitting: *Deleted

## 2017-04-02 ENCOUNTER — Encounter (INDEPENDENT_AMBULATORY_CARE_PROVIDER_SITE_OTHER): Payer: Self-pay | Admitting: Diagnostic Neuroimaging

## 2017-04-02 ENCOUNTER — Ambulatory Visit (INDEPENDENT_AMBULATORY_CARE_PROVIDER_SITE_OTHER): Payer: 59 | Admitting: Diagnostic Neuroimaging

## 2017-04-02 DIAGNOSIS — Z0289 Encounter for other administrative examinations: Secondary | ICD-10-CM

## 2017-04-02 DIAGNOSIS — M542 Cervicalgia: Secondary | ICD-10-CM | POA: Diagnosis not present

## 2017-04-02 DIAGNOSIS — R208 Other disturbances of skin sensation: Secondary | ICD-10-CM

## 2017-04-02 DIAGNOSIS — M79642 Pain in left hand: Secondary | ICD-10-CM

## 2017-04-03 NOTE — Procedures (Signed)
[facility]  NCS (NERVE CONDUCTION STUDY) WITH EMG (ELECTROMYOGRAPHY) REPORT   STUDY DATE: 04/02/17 PATIENT NAME: Joan Ray DOB: January 06, 1961 MRN: [id number]  ORDERING CLINICIAN: Arlice Colt, MD PhD   TECHNOLOGIST: Oneita Jolly ELECTROMYOGRAPHER: Earlean Polka. Penumalli, MD  CLINICAL INFORMATION: 56 year old female here for evaluation of bilateral upper extremity numbness.  FINDINGS: NERVE CONDUCTION STUDY: Bilateral median motor responses have prolonged distal latencies, normal amplitudes, normal conduction velocities.  Bilateral ulnar motor responses and F wave latencies are normal.  Bilateral median sensory responses have prolonged peak latencies and decreased amplitudes.  Bilateral ulnar sensory responses are normal.    NEEDLE ELECTROMYOGRAPHY: Needle examination of right upper extremity deltoid, biceps, triceps, flexor carpi radialis, first dorsal interosseous and right cervical paraspinal muscles are normal.   IMPRESSION:  Abnormal study demonstrating: - Mild bilateral median neuropathies at the wrists consistent with mild bilateral carpal tunnel syndrome.     INTERPRETING PHYSICIAN:  Penni Bombard, MD Certified in Neurology, Neurophysiology and Neuroimaging  [facility] [address] [place], [place] [address] [phone]  [facility]    Nerve / Sites Muscle Latency Ref. Amplitude Ref. Rel Amp Segments Distance Velocity Ref. Area    ms ms mV mV %  cm m/s m/s mVms  L Median - APB     Wrist APB 4.7 ?4.4 4.1 ?4.0 100 Wrist - APB 7   11.8     Upper arm APB 9.1  3.3  79.5 Upper arm - Wrist 22 50 ?49 9.4  R Median - APB     Wrist APB 4.6 ?4.4 8.8 ?4.0 100 Wrist - APB 7   32.5     Upper arm APB 9.2  7.5  85.6 Upper arm - Wrist 23 50 ?49 27.5  L Ulnar - ADM     Wrist ADM 2.7 ?3.3 11.2 ?6.0 100 Wrist - ADM 7   32.0     B.Elbow ADM 6.1  9.5  84.1 B.Elbow - Wrist 18 52 ?49 29.4     A.Elbow ADM 7.9  10.0  106  A.Elbow - B.Elbow 10 56 ?49 31.2         A.Elbow - Wrist      R Ulnar - ADM     Wrist ADM 2.4 ?3.3 12.2 ?6.0 100 Wrist - ADM 7   40.8     B.Elbow ADM 6.1  10.0  82.2 B.Elbow - Wrist 18 49 ?49 31.1     A.Elbow ADM 8.1  10.1  101 A.Elbow - B.Elbow 10 49 ?49 32.9         A.Elbow - Wrist                 SNC    Nerve / Sites Rec. Site Peak Lat Ref.  Amp Ref. Segments Distance    ms ms V V  cm  L Median - Orthodromic (Dig II, Mid palm)     Dig II Wrist 4.4 ?3.4 8 ?10 Dig II - Wrist 13  R Median - Orthodromic (Dig II, Mid palm)     Dig II Wrist 4.5 ?3.4 4 ?10 Dig II - Wrist 13  L Ulnar - Orthodromic, (Dig V, Mid palm)     Dig V Wrist 2.6 ?3.1 7 ?5 Dig V - Wrist 11  R Ulnar - Orthodromic, (Dig V, Mid palm)     Dig V Wrist 2.7 ?3.1 6 ?5 Dig V - Wrist 11  F  Wave    Nerve F Lat Ref.   ms ms  L Ulnar - ADM 29.5 ?32.0  R Ulnar - ADM 29.6 ?32.0         EMG full       EMG Summary Table    Spontaneous MUAP Recruitment  Muscle IA Fib PSW Fasc Other Amp Dur. Poly Pattern  R. Deltoid Normal None None None _______ Normal Normal Normal Normal  R. Biceps brachii Normal None None None _______ Normal Normal Normal Normal  R. Triceps brachii Normal None None None _______ Normal Normal Normal Normal  R. Flexor carpi radialis Normal None None None _______ Normal Normal Normal Normal  R. First dorsal interosseous Normal None None None _______ Normal Normal Normal Normal  R. Cervical paraspinals Normal None None None _______ Normal Normal Normal Normal

## 2017-04-08 ENCOUNTER — Telehealth: Payer: Self-pay | Admitting: *Deleted

## 2017-04-08 NOTE — Telephone Encounter (Signed)
LMOM (identified vm) with below NCV report and rec. for bilat wrist splints at night and referral to ortho surgeon if sx. worsen/fim

## 2017-04-08 NOTE — Telephone Encounter (Signed)
-----   Message from Britt Bottom, MD sent at 04/08/2017  1:35 PM EDT ----- Please let her know that the nerve study did show that she had mild carpal tunnel syndrome in both hands. If symptoms are bothering her at night, wearing wrist splints that she can pick up at any drug store or  buy on [place] might be useful.   If symptoms worsen we could refer her for carpal tunnel surgery

## 2017-04-09 ENCOUNTER — Other Ambulatory Visit: Payer: Self-pay | Admitting: Neurology

## 2017-04-14 ENCOUNTER — Other Ambulatory Visit: Payer: Self-pay | Admitting: Neurology

## 2017-07-01 ENCOUNTER — Encounter: Payer: Self-pay | Admitting: Neurology

## 2017-07-03 NOTE — Telephone Encounter (Signed)
Discussed patient's my chart message with Dr Leta Baptist, work in dr. Dr [name] is out of the office. Called patient and informed her Dr [name] and RN are out of office this week. Inquired  if she has enough medication to get through until next week. She stated she has plenty.  This RN inquired if Joan Ray and Dr [name] can refill meds and address her other questions, requests next week. She agreed. Tramadol was added to her allergy list with side effects she gave this RN.  Patient then stated she received VM saying her Tysabri appt was rescheduled for a Monday. She stated she can only come on Thurs afternoons. This RN stated will let Otila Kluver RN know. Patient verbalized understanding, appreciation of call.

## 2017-07-08 ENCOUNTER — Telehealth: Payer: Self-pay | Admitting: *Deleted

## 2017-07-08 MED ORDER — CELECOXIB 200 MG PO CAPS: 200.0000 mg | ORAL_CAPSULE | Freq: Two times a day (BID) | ORAL | 3 refills | Status: DC

## 2017-07-08 MED ORDER — VERAPAMIL HCL ER 180 MG PO TBCR: 180.0000 mg | EXTENDED_RELEASE_TABLET | Freq: Every day | ORAL | 3 refills | Status: DC

## 2017-07-08 MED ORDER — DULOXETINE HCL 60 MG PO CPEP: 120.0000 mg | ORAL_CAPSULE | Freq: Every day | ORAL | 3 refills | Status: DC

## 2017-07-08 MED ORDER — PREGABALIN 100 MG PO CAPS: 100.0000 mg | ORAL_CAPSULE | Freq: Two times a day (BID) | ORAL | 1 refills | Status: DC

## 2017-07-08 MED ORDER — CYCLOBENZAPRINE HCL 10 MG PO TABS: ORAL_TABLET | ORAL | 1 refills | Status: DC

## 2017-07-08 NOTE — Telephone Encounter (Signed)
90 day supplies of Lyrica, Celebrex, Cymbalta, Flexeril, Verapamil escribed to [facility].[place]

## 2017-07-10 ENCOUNTER — Telehealth: Payer: Self-pay | Admitting: *Deleted

## 2017-07-10 NOTE — Telephone Encounter (Signed)
Received fax notification that patient authorized via touch program from 07/29/17-01/27/18.  Account: Joan Ray #: [id number]. Gave copy to intrafusion and sent copy to MR to be scanned.

## 2017-08-24 ENCOUNTER — Telehealth: Payer: Self-pay | Admitting: *Deleted

## 2017-08-24 MED ORDER — VERAPAMIL HCL 40 MG PO TABS: 40.0000 mg | ORAL_TABLET | Freq: Three times a day (TID) | ORAL | 1 refills | Status: DC

## 2017-08-24 NOTE — Telephone Encounter (Signed)
Fax received from [facility] stating pt's Verapamil ER 180mg  tablets are on back order.  Per RAS, ok for Calan mg tid.  Rx. escribed to [facility]/fim

## 2017-09-10 ENCOUNTER — Ambulatory Visit: Payer: 59 | Admitting: Neurology

## 2017-09-10 ENCOUNTER — Encounter: Payer: Self-pay | Admitting: Neurology

## 2017-09-10 ENCOUNTER — Other Ambulatory Visit: Payer: Self-pay

## 2017-09-10 DIAGNOSIS — R269 Unspecified abnormalities of gait and mobility: Secondary | ICD-10-CM

## 2017-09-10 DIAGNOSIS — G43009 Migraine without aura, not intractable, without status migrainosus: Secondary | ICD-10-CM

## 2017-09-10 DIAGNOSIS — R208 Other disturbances of skin sensation: Secondary | ICD-10-CM | POA: Diagnosis not present

## 2017-09-10 DIAGNOSIS — R5383 Other fatigue: Secondary | ICD-10-CM

## 2017-09-10 DIAGNOSIS — G35 Multiple sclerosis: Secondary | ICD-10-CM | POA: Diagnosis not present

## 2017-09-10 MED ORDER — VERAPAMIL HCL ER 180 MG PO TBCR: 180.0000 mg | EXTENDED_RELEASE_TABLET | Freq: Every day | ORAL | 11 refills | Status: DC

## 2017-09-10 NOTE — Progress Notes (Signed)
[facility]  PATIENT: Joan Ray DOB: Nov 08, 1960  REFERRING DOCTOR OR PCP:  Melina Schools [phone]) Also Joan Ray (PCP) SOURCE: Patient, MRI images on PACS and CD and notes from PCP and ortho  _________________________________   HISTORICAL  CHIEF COMPLAINT:  Chief Complaint  Patient presents with  . Multiple Sclerosis    Tysabri.  JCV ab last drawn 03/12/17 and was Indeterminate at 0.26.  Assay was Negative./fim    HISTORY OF PRESENT ILLNESS:  Joan Ray is a 57 year old right-handed patient with MS diagnosed in 2010.     Update 09/10/2017: She feels her MS is stable.    She is on Tysabri 300 mg q 4 weeks and tolerates it well.   Her last JCV Ab was negative at 0.26.    Gait and leg strength are fine for the most part.  She is experiencing more dysesthetic pain as well as joint pain. She has some wrist / hand weakness at times.    .   She notes foot pain is doing worse.   She is on Lyrica.    She tolerates 100 mg bid but 300 mg causes cognitive slowing.    She is also on Duloxetine 120 mg.      per about her function is about the same. She notes some fatigue mood has been better on Cymbalta. The brand works better than the generic but insurance would not cover it. Cognition is fine.  Celebrex helps her arthritic pain but not the nerve pain.    She does better with the brand than the generic.       She has migraines.  Generic amlodipine did not help as much as the brand but brand is very expensive.   Calan SR 180 mg is not available currently.       She has some weight loss on phentermine plus metformin but not much.   She is not a candidate for gastric bypass due to gastric polyps.      From 03/12/2017: MS:   She is on Tysabri and tolerates it well..  She has been JCV Ab negative.  MRI 09/19/2015 showed no definite new lesions compared to 07/02/2010.  Migraine:  She reports migraines did better on brand name Norvasc than on amlodipine.       Neck/back pain/CTS:   She also has neck pain and back pain.  She also has carpal tunnel syndrome (on EMG by Dr. Starleen Blue but Dr. Nelva Bush felt due to shoulder impingement (by her report) Also more knee pain.     Insurance won't cover name brand Celebrex.  Other:   She is undergoing evaluation for bariatric surgery but stomach polyps were noted so this is on hold.  She is exercising and taking phentermine but weight is not improving.    Gait/strength: Gait is stable. She occasionally stumbles but has no falls..  No weakness and no numbness in legs.    Bladder: She has urinary frequency and urgency. This is mild.   No incontinence.     Vision: She had a good recovery from the optic neuritis. She denies any significant problems with her vision now. She does wear glasses. There'll be some fluctuations in her vision at times.  Fatigue/sleep: She has fatigue and also notes excessive sweating.    She has insomnia, but with melatonin and clonazepam (often needs 0.5 mg x 2) she falls asleep easily but wakes up and then can't fall back asleep.    RLS symptoms are  much better on clonazepam and Lyrica. A sleep study in the past did not show sleep apnea but did show periodic limb movements of sleep.    GERD is a problem at night.    Mood/cognition: She has mild depression and anxiety and has been helped by Cymbalta.   She was switched to generic Cymbalta but it has not helped as much as the brand name.  . She notes mild cognitive issues.    Shehas poor attention and is easily distracted.    She has mild difficulty with executive function.    She has not had formal neurocognitive testing.    Migraine:   She has had migraine headaches and was on Norvasc with benefit However, it was switched to generic and migraines worsened.     Spine/limb pain history:    She reports having  L5-S1 fusion in October 2014 and C5-C7 fusion June 2016.   Although some symptoms improved after surgery, she continues to have a lot of  difficulties with neck pain, arm pain, back pain and leg pain.    She was placed on Lyrica for her pain and was titrated to 200 mg twice a day but could not tolerate that dose. She has since been reduced to 100 mg by mouth twice a day and tolerates that dose but continues to have pain.      She reports some axial neck and lower back pain but the dysesthetic pain in the hands and feet is more troublesome.    Both hands have the dysesthetic pain but in the legs, the right foot is much worse than the left.   She gets some benefit from Lyrica.   She felt her pain had been better on Cymbalta than on Effexor. However, in 2013 when she was having more neurologic issues the Cymbalta was discontinued.   In the right foot, the pain is most intense on the top of the foot and towards the ankle but not above the ankle.    I personally reviewed the MRI of the cervical spine from 10/07/2014.   It shows degenerative changes at C5-C6 resulting in left foraminal narrowing and changes at C6-C7 resulting right foraminal narrowing. The spinal cord appears normal. Plain films done after surgery showed a C5-C7 fusion and lumbar plain films showed the L5-S1 fusion.  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 She also had pain in the left eye. She went to an ophthalmologist who did some tests and referred her or an MRI. The MRI was abnormal, consistent with MS and she was referred to Dr. Maye Hides.   Usually, she was placed on weekly Avonex injections. She did not like the way that she felt on the injections. Additionally, an MRI of the brain showed that she had 2 new lesions. Therefore, she was switched to Tysabri in 2011.     On Tysabri, she has had no definite exacerbations, no change in MRI. Additionally she tolerates the medication very well. She has had some low positive JCV antibody titers. We do not have the actual numbers.    She switched from Dr. Starleen Blue to Dr. William Hamburger in [place] in  2013. She is interested in changing to an [place].    Last MRI was early 2016 at [place] [facility] in [place].     REVIEW OF SYSTEMS: Constitutional: No fevers, chills, sweats, or change in appetite.    She has insomnia and restless leg  syndrome she reports a lot of fatigue. Eyes: No visual changes, double vision, eye pain Ear, nose and throat: No hearing loss, ear pain, nasal congestion, sore throat Cardiovascular: No chest pain, palpitations Respiratory: No shortness of breath at rest or with exertion.   No wheezes GastrointestinaI: No nausea, vomiting, diarrhea, abdominal pain, fecal incontinence Genitourinary: No dysuria, urinary retention or frequency.  No nocturia. Musculoskeletal: as above Integumentary: No rash, pruritus, skin lesions Neurological: as above Psychiatric: Notes depression > anxiety Endocrine: No palpitations, diaphoresis, change in appetite, change in weigh or increased thirst Hematologic/Lymphatic: No anemia, purpura, petechiae. Allergic/Immunologic: No itchy/runny eyes, nasal congestion, recent allergic reactions, rashes  ALLERGIES: Allergies  Allergen Reactions  . Hydrocodone-Acetaminophen Itching  . Progesterone Other (See Comments) and Rash    increase/profuse sweating-flushing-  . Sertraline Hcl Other (See Comments)  . Statins Swelling    muscle and joint pain/tightness  . Betadine [Povidone Iodine]     Blisters  . Codeine Itching    Rash  . Darvocet [Propoxyphene N-Acetaminophen] Nausea And Vomiting  . Diazepam     Increases agitation  . Dilaudid [Hydromorphone Hcl] Itching  . Doxycycline     Facial flushing  . Keppra [Levetiracetam]     Shaking, nervousness, palpations and flushing  . Lamictal [Lamotrigine]     Causes tongue and mouth to tingle  . Lipitor [Atorvastatin]     Severe muscle and joint pain / ms relapse  . Lyrica [Pregabalin] Other (See Comments)    Doses over 300mg  decrease memory  . Neurontin  [Gabapentin]     Impaired cognition, slower motor skills (intolerant)  . Other     EDTA causes blisters  . Oxycodone Hcl Itching  . Pravastatin Other (See Comments)    Severe joint pain MS relapse  . Sertraline Other (See Comments)    Increased nervousness  . Topamax [Topiramate]     Confusion  . Tramadol Other (See Comments)    "Nervous, jittery, felt weird, nausea"  . Zonisamide     Back pain, headaches, insomnia, chest pressure  . Cephalosporins Rash  . Erythromycin Itching and Rash  . Keflex [Cephalexin] Rash  . Vancomycin Rash    Pt stated, " I can't tolerate Vancomycin I get a red rash."    HOME MEDICATIONS:  Current Outpatient Medications:  .  celecoxib (CELEBREX) 200 MG capsule, Take 1 capsule (200 mg total) by mouth 2 (two) times daily., Disp: 180 capsule, Rfl: 3 .  cetirizine (ZYRTEC) 10 MG tablet, Take 10 mg by mouth daily., Disp: , Rfl:  .  clonazePAM (KLONOPIN) 0.5 MG tablet, TAKE 1 TABLET BY MOUTH TWICE DAILY AS NEEDED FOR ANXIETY, Disp: 60 tablet, Rfl: 0 .  cyclobenzaprine (FLEXERIL) 10 MG tablet, take 1 tablet by mouth three times a day if needed, Disp: 270 tablet, Rfl: 1 .  diclofenac sodium (VOLTAREN) 1 % GEL, Apply 1 application topically as needed., Disp: , Rfl:  .  docusate sodium (COLACE) 100 MG capsule, Take 100 mg by mouth 2 (two) times daily., Disp: , Rfl:  .  DULoxetine (CYMBALTA) 60 MG capsule, Take 2 capsules (120 mg total) by mouth daily. BRAND NAME CYMBALTA MEDICALLY NECESSARY, Disp: 180 capsule, Rfl: 3 .  hydrochlorothiazide (MICROZIDE) 12.5 MG capsule, Take 12.5 mg by mouth daily., Disp: , Rfl:  .  lidocaine (LIDODERM) 5 %, apply 3 patches to affected area daily as directed, Disp: 90 patch, Rfl: 6 .  Melatonin 10 MG CAPS, Take 10 mg by mouth at bedtime. , Disp: ,  Rfl:  .  natalizumab (TYSABRI) 300 MG/15ML injection, Inject 300 mg into the vein every 30 (thirty) days. Takes for MS, Disp: , Rfl:  .  NORVASC 10 MG tablet, Take 1 tablet (10 mg total) by  mouth daily., Disp: 90 tablet, Rfl: 3 .  phentermine (ADIPEX-P) 37.5 MG tablet, Take 37.5 mg by mouth daily before breakfast., Disp: , Rfl:  .  pregabalin (LYRICA) 100 MG capsule, Take 1 capsule (100 mg total) by mouth 2 (two) times daily., Disp: 180 capsule, Rfl: 1 .  rizatriptan (MAXALT) 10 MG tablet, Take 10 mg by mouth every 2 (two) hours as needed for migraine. May repeat in 2 hours if needed, Disp: , Rfl:  .  esomeprazole (NEXIUM) 40 MG capsule, Take 40 mg by mouth at bedtime., Disp: , Rfl:  .  metFORMIN (GLUCOPHAGE) 500 MG tablet, , Disp: , Rfl: 1 .  traMADol (ULTRAM) 50 MG tablet, Take 1 tablet (50 mg total) by mouth every 6 (six) hours as needed., Disp: 30 tablet, Rfl: 1 .  verapamil (CALAN SR) 180 MG CR tablet, Take 1 tablet (180 mg total) by mouth at bedtime., Disp: 30 tablet, Rfl: 11 No current facility-administered medications for this visit.   Facility-Administered Medications Ordered in Other Visits:  .  gadopentetate dimeglumine (MAGNEVIST) injection 20 mL, 20 mL, Intravenous, Once PRN, Dylin Ihnen, Nanine Means, MD  PAST MEDICAL HISTORY: Past Medical History:  Diagnosis Date  . Anemia    h/o of   Last infusion of iron  2008  . Anxiety   . Arthritis    osteo   . Complication of anesthesia   . Constipation   . Depression    d/t decreased activity  . GERD (gastroesophageal reflux disease)   . Headache(784.0)    migraines  . High triglycerides   . MS (multiple sclerosis) ([place])   . Neuromuscular disorder ([place])    mutliple sclerosis--dx 10/2008  . PONV (postoperative nausea and vomiting)   . Restless leg syndrome   . Wears glasses   . Wears glasses     PAST SURGICAL HISTORY: Past Surgical History:  Procedure Laterality Date  . ANTERIOR CERVICAL DECOMP/DISCECTOMY FUSION N/A 12/21/2014   Procedure: ANTERIOR CERVICAL DECOMPRESSION/DISCECTOMY FUSION 2 LEVELS C5-7;  Surgeon: Melina Schools, MD;  Location: [place] [facility];  Service: Orthopedics;  Laterality: N/A;  . BLADDER SUSPENSION      occas  blood in urine  . ENDOMETRIAL ABLATION  2010  . FOOT ARTHRODESIS Right 07/28/2013   Procedure: ARTHRODESIS RIGHT MIDFOOT OF THE FIRST -  THIRD TARSOMETATARSAL JOINTS ;  Surgeon: Wylene Simmer, MD;  Location: [facility];  Service: Orthopedics;  Laterality: Right;  . GASTROCNEMIUS RECESSION Right 07/28/2013   Procedure: RIGHT GASTROCNEMIUS RECESSION;  Surgeon: Wylene Simmer, MD;  Location: [facility];  Service: Orthopedics;  Laterality: Right;  . JOINT REPLACEMENT  2012   lt total knee  . KNEE ARTHROSCOPY     bilateral  in 2010, has multiple scopes  . MEDIAL PARTIAL KNEE REPLACEMENT     right  . nerve blocks    . SPINAL FUSION  05-05-2013  . stress fracture     right 2-3 toe  . TOTAL KNEE ARTHROPLASTY  2008   partial rt     FAMILY HISTORY: Family History  Problem Relation Age of Onset  . Heart disease Mother   . Cancer Mother   . Hypertension Mother   . Depression Mother   . Heart disease Other   . Hypertension  Maternal Grandmother   . Diabetes Maternal Grandmother   . Kidney disease Maternal Grandmother   . Rheum arthritis Maternal Grandmother   . Depression Maternal Grandmother   . Hypertension Paternal Grandmother   . Kidney disease Maternal Grandfather   . Depression Maternal Grandfather   . Depression Brother     SOCIAL HISTORY:  Social History   Socioeconomic History  . Marital status: Married    Spouse name: Not on file  . Number of children: 3  . Years of education: Not on file  . Highest education level: Not on file  Social Needs  . Financial resource strain: Not on file  . Food insecurity - worry: Not on file  . Food insecurity - inability: Not on file  . Transportation needs - medical: Not on file  . Transportation needs - non-medical: Not on file  Occupational History  . Not on file  Tobacco Use  . Smoking status: Never Smoker  . Smokeless tobacco: Never Used  Substance and Sexual Activity  . Alcohol use: Yes     Comment: socially--wine & beer  . Drug use: No  . Sexual activity: Not on file  Other Topics Concern  . Not on file  Social History Narrative   Lives at home with   Caffeine use:      PHYSICAL EXAM  There were no vitals filed for this visit.  There is no height or weight on file to calculate BMI.   General: The patient is well-developed and well-nourished and in no acute distress  Neurologic Exam  Mental status: The patient is alert and oriented x 3 at the time of the examination. The patient has apparent normal recent and remote memory, with an apparently normal attention span and concentration ability.   Speech is normal.  Cranial nerves: Extraocular movements are full. Facial strength and sensation is normal. Trapezius strength is strong. The tongue is midline, and the patient has symmetric elevation of the soft palate. No obvious hearing deficits are noted.  Motor:  Muscle bulk is normal.   Tone is normal. Strength is  5 / 5 in all 4 extremities x 4+/5 right EHL  Sensory: Sensory testing is intact to pinprick, soft touch and vibration sensation in arms.  There is allodynia in the L5 distribution of the right foot associated with numbness.   Sensation is better in the left foot.    Coordination: Cerebellar testing reveals good finger-nose-finger and heel-to-shin bilaterally.  Gait and station: Station is normal.   The gait is mildly wide. Tandem gait is wide.. Romberg is negative.   Reflexes: Deep tendon reflexes are symmetric and normal bilaterally.          DIAGNOSTIC DATA (LABS, IMAGING, TESTING) - I reviewed patient records, labs, notes, testing and imaging myself where available.  Lab Results  Component Value Date   WBC 7.9 03/12/2017   HGB 12.9 03/12/2017   HCT 39.9 03/12/2017   MCV 87 03/12/2017   PLT 209 03/12/2017      Component Value Date/Time   NA 140 12/07/2014 0918   K 4.3 12/07/2014 0918   CL 106 12/07/2014 0918   CO2 26 12/07/2014 0918    GLUCOSE 106 (H) 12/07/2014 0918   BUN 16 12/07/2014 0918   CREATININE 0.79 12/07/2014 0918   CALCIUM 9.2 12/07/2014 0918   PROT 6.8 04/27/2013 0916   ALBUMIN 4.0 04/27/2013 0916   AST 19 04/27/2013 0916   ALT 15 04/27/2013 0916   ALKPHOS 64 04/27/2013  0916   BILITOT 0.4 04/27/2013 0916   GFRNONAA >60 12/07/2014 0918   GFRAA >60 12/07/2014 0918      ASSESSMENT AND PLAN  Multiple sclerosis ([place]) - Plan: Stratify JCV Antibody Test ([facility]), CBC with Differential/Platelet, MR BRAIN W WO CONTRAST  Common migraine without intractability  Dysesthesia  Other fatigue  Gait disturbance   1.   Continue Tysabri. We will check JCV antibody and CBC today  .   We will check an MRI of the brain to determine if there is any subclinical progression. If present, we will need to consider a different disease modifying therapy.  2.  Continue medications For her MS symptoms and migraines.    Try Verapamil SR 180 again.    If not available can take IR 40 mg 3-4 a day 3.  She is advised to stay active and exercises as tolerated. 4.   Return in 6 months or sooner if there are new or worsening neurologic symptoms.  Akansha Wyche A. [name], MD, PhD 11/06/7480, 7:07 PM Certified in Neurology, Clinical Neurophysiology, Sleep Medicine, Pain Medicine and Neuroimaging  [facility] [address], [place], [place] [address] [phone]

## 2017-09-11 LAB — CBC WITH DIFFERENTIAL/PLATELET
BASOS ABS: 0 10*3/uL (ref 0.0–0.2)
Basos: 1 %
EOS (ABSOLUTE): 0.4 10*3/uL (ref 0.0–0.4)
Eos: 6 %
HEMOGLOBIN: 13 g/dL (ref 11.1–15.9)
Hematocrit: 37.5 % (ref 34.0–46.6)
IMMATURE GRANULOCYTES: 1 %
Immature Grans (Abs): 0 10*3/uL (ref 0.0–0.1)
LYMPHS ABS: 2.9 10*3/uL (ref 0.7–3.1)
Lymphs: 42 %
MCH: 28.9 pg (ref 26.6–33.0)
MCHC: 34.7 g/dL (ref 31.5–35.7)
MCV: 83 fL (ref 79–97)
MONOCYTES: 6 %
Monocytes Absolute: 0.4 10*3/uL (ref 0.1–0.9)
NEUTROS PCT: 44 %
Neutrophils Absolute: 3.1 10*3/uL (ref 1.4–7.0)
Platelets: 184 10*3/uL (ref 150–379)
RBC: 4.5 x10E6/uL (ref 3.77–5.28)
RDW: 15.6 % — AB (ref 12.3–15.4)
WBC: 6.9 10*3/uL (ref 3.4–10.8)

## 2017-09-14 ENCOUNTER — Telehealth: Payer: Self-pay | Admitting: Neurology

## 2017-09-14 NOTE — Telephone Encounter (Signed)
Spoke to the patient she wants her MRI at [facility]. Sent the order to GI .

## 2017-09-14 NOTE — Telephone Encounter (Signed)
09/14/17 [employer] Auth: [place] via [employer] website EE 09/14/17 lvm for pt to call back about scheduling mri EE

## 2017-09-15 ENCOUNTER — Emergency Department
Admission: EM | Admit: 2017-09-15 | Discharge: 2017-09-15 | Disposition: A | Discharge status: Home / Self Care | Payer: 59 | Source: Home / Self Care | Attending: Family Medicine | Admitting: Family Medicine

## 2017-09-15 ENCOUNTER — Other Ambulatory Visit: Payer: Self-pay

## 2017-09-15 ENCOUNTER — Encounter: Payer: Self-pay | Admitting: *Deleted

## 2017-09-15 DIAGNOSIS — B9789 Other viral agents as the cause of diseases classified elsewhere: Secondary | ICD-10-CM | POA: Diagnosis not present

## 2017-09-15 DIAGNOSIS — J069 Acute upper respiratory infection, unspecified: Secondary | ICD-10-CM | POA: Diagnosis not present

## 2017-09-15 LAB — POCT INFLUENZA A/B
INFLUENZA A, POC: NEGATIVE
INFLUENZA B, POC: NEGATIVE

## 2017-09-15 NOTE — ED Triage Notes (Signed)
Pt c/o productive cough, bilateral ear ache, dental pain and HA x 2 days. Denies nasal congestion. Temp 100.1 last night. She has taken Mucinex.

## 2017-09-15 NOTE — Discharge Instructions (Addendum)
Take plain guaifenesin (1200mg  extended release tabs such as Mucinex) twice daily, with plenty of water, for cough and congestion.  May add Pseudoephedrine (30mg , one or two every 4 to 6 hours) for sinus congestion.  Get adequate rest.   May use Afrin nasal spray (or generic oxymetazoline) each morning for about 5 days and then discontinue.  Also recommend using saline nasal spray several times daily and saline nasal irrigation (AYR is a common brand).   Try warm salt water gargles for sore throat.  Stop all antihistamines for now, and other non-prescription cough/cold preparations. May take Delsym Cough Suppressant at bedtime for nighttime cough.

## 2017-09-16 ENCOUNTER — Encounter: Payer: Self-pay | Admitting: Neurology

## 2017-09-16 NOTE — ED Provider Notes (Signed)
[facility]    CSN: [id number] Arrival date & time: 09/15/17  1055     History   Chief Complaint Chief Complaint  Patient presents with  . Cough    HPI Joan Ray is a 57 y.o. female.   Two days ago patient developed a "raw" cough, bilateral ear pressure, dental ache, and headache.  She developed myalgias and sinus congestion yesterday.  Last night she felt flushed and had a fever to 100.1.   The history is provided by the patient.    Past Medical History:  Diagnosis Date  . Anemia    h/o of   Last infusion of iron  2008  . Anxiety   . Arthritis    osteo   . Complication of anesthesia   . Constipation   . Depression    d/t decreased activity  . GERD (gastroesophageal reflux disease)   . Headache(784.0)    migraines  . High triglycerides   . MS (multiple sclerosis) ([place])   . Neuromuscular disorder ([place])    mutliple sclerosis--dx 10/2008  . PONV (postoperative nausea and vomiting)   . Restless leg syndrome   . Wears glasses   . Wears glasses     Patient Active Problem List   Diagnosis Date Noted  . Body mass index (BMI) of 40.0-44.9 in adult ([place]) 05/16/2015  . Morbid obesity ([place]) 05/16/2015  . Multiple sclerosis ([place]) 05/15/2015  . Dysesthesia 05/15/2015  . Depression with anxiety 05/15/2015  . Other fatigue 05/15/2015  . Insomnia 05/15/2015  . History of optic neuritis 05/15/2015  . Common migraine without intractability 05/15/2015  . Gait disturbance 05/15/2015  . Restless leg syndrome 05/15/2015  . Neck pain 12/21/2014  . Class 2 obesity 11/04/2014  . Cystocele, midline 08/02/2014  . 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 Major depressive disorder, single episode, severe without psychotic features ([place]) 04/19/2014  . Lipoma of back 01/23/2014  . 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 Thoracic and lumbosacral neuritis 01/05/2013  . Abnormal finding on mammography 09/19/2012  . Adult BMI 30+  06/13/2012  . Edema extremities 06/13/2012  . Abnormal glucose level 06/13/2012  . Acid reflux 06/11/2012  . Arthritis, degenerative 06/11/2012  . Atypical chest pain 03/30/2012  . HLD (hyperlipidemia) 03/30/2012  . 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 ANTERIOR CERVICAL DECOMP/DISCECTOMY FUSION N/A 12/21/2014   Procedure: ANTERIOR CERVICAL DECOMPRESSION/DISCECTOMY FUSION 2 LEVELS C5-7;  Surgeon: Melina Schools, MD;  Location: [facility];  Service: Orthopedics;  Laterality: N/A;  . BLADDER SUSPENSION     occas  blood in urine  . ENDOMETRIAL ABLATION  2010  . FOOT ARTHRODESIS Right 07/28/2013   Procedure: ARTHRODESIS RIGHT MIDFOOT OF THE FIRST -  THIRD TARSOMETATARSAL JOINTS ;  Surgeon: Wylene Simmer, MD;  Location: [facility];  Service: Orthopedics;  Laterality: Right;  . GASTROCNEMIUS RECESSION Right 07/28/2013   Procedure: RIGHT GASTROCNEMIUS RECESSION;  Surgeon: Wylene Simmer, MD;  Location: [facility];  Service: Orthopedics;  Laterality: Right;  . JOINT REPLACEMENT  2012   lt total knee  . KNEE ARTHROSCOPY     bilateral  in 2010, has multiple scopes  . MEDIAL PARTIAL KNEE REPLACEMENT     right  . nerve blocks    . SPINAL FUSION  05-05-2013  . stress fracture     right 2-3 toe  . TOTAL KNEE ARTHROPLASTY  2008   partial rt  OB History    No data available       Home Medications    Prior to Admission medications   Medication Sig Start Date End Date Taking? Authorizing Provider  celecoxib (CELEBREX) 200 MG capsule Take 1 capsule (200 mg total) by mouth 2 (two) times daily. 07/08/17  Yes Sater, Nanine Means, MD  cetirizine (ZYRTEC) 10 MG tablet Take 10 mg by mouth daily.   Yes [provider]  clonazePAM (KLONOPIN) 0.5 MG tablet TAKE 1 TABLET BY MOUTH TWICE DAILY AS NEEDED FOR ANXIETY 04/15/17  Yes Sater, Nanine Means, MD  cyclobenzaprine (FLEXERIL) 10 MG tablet take 1 tablet by mouth three times a day  if needed 07/08/17  Yes Sater, Nanine Means, MD  diclofenac sodium (VOLTAREN) 1 % GEL Apply 1 application topically as needed. 03/02/14  Yes [provider]  docusate sodium (COLACE) 100 MG capsule Take 100 mg by mouth 2 (two) times daily.   Yes [provider]  DULoxetine (CYMBALTA) 60 MG capsule Take 2 capsules (120 mg total) by mouth daily. BRAND NAME CYMBALTA MEDICALLY NECESSARY 07/08/17  Yes Sater, Nanine Means, MD  hydrochlorothiazide (MICROZIDE) 12.5 MG capsule Take 12.5 mg by mouth daily.   Yes [provider]  lidocaine (LIDODERM) 5 % apply 3 patches to affected area daily as directed 06/10/16  Yes Sater, Nanine Means, MD  Melatonin 10 MG CAPS Take 10 mg by mouth at bedtime.    Yes [provider]  metFORMIN (GLUCOPHAGE) 500 MG tablet  08/15/17  Yes [provider]  natalizumab (TYSABRI) 300 MG/15ML injection Inject 300 mg into the vein every 30 (thirty) days. Takes for MS   Yes [provider]  phentermine (ADIPEX-P) 37.5 MG tablet Take 37.5 mg by mouth daily before breakfast.   Yes [provider]  pregabalin (LYRICA) 100 MG capsule Take 1 capsule (100 mg total) by mouth 2 (two) times daily. 07/08/17  Yes Sater, Nanine Means, MD  ranitidine (ZANTAC) 150 MG tablet Take 150 mg by mouth 2 (two) times daily.   Yes [provider]  rizatriptan (MAXALT) 10 MG tablet Take 10 mg by mouth every 2 (two) hours as needed for migraine. May repeat in 2 hours if needed   Yes [provider]  verapamil (CALAN SR) 180 MG CR tablet Take 1 tablet (180 mg total) by mouth at bedtime. 09/10/17  Yes Sater, Nanine Means, MD  esomeprazole (NEXIUM) 40 MG capsule Take 40 mg by mouth at bedtime.    [provider]  NORVASC 10 MG tablet Take 1 tablet (10 mg total) by mouth daily. 10/09/16   Sater, Nanine Means, MD  traMADol (ULTRAM) 50 MG tablet Take 1 tablet (50 mg total) by mouth every 6 (six) hours as needed. 03/12/17   Sater, Nanine Means, MD    Family  History Family History  Problem Relation Age of Onset  . Heart disease Mother   . Cancer Mother   . Hypertension Mother   . Depression Mother   . Heart disease Other   . Hypertension Maternal Grandmother   . Diabetes Maternal Grandmother   . Kidney disease Maternal Grandmother   . Rheum arthritis Maternal Grandmother   . Depression Maternal Grandmother   . Hypertension Paternal Grandmother   . Kidney disease Maternal Grandfather   . Depression Maternal Grandfather   . Depression Brother     Social History Social History   Tobacco Use  . Smoking status: Never Smoker  . Smokeless tobacco: Never Used  Substance Use Topics  . Alcohol use: Yes    Comment: socially--wine & beer  . Drug use: No     Allergies   Hydrocodone-acetaminophen; Progesterone; Sertraline hcl; Statins; Betadine [povidone iodine]; Codeine; Darvocet [propoxyphene n-acetaminophen]; Diazepam; Dilaudid [hydromorphone hcl]; Doxycycline; Keppra [levetiracetam]; Lamictal [lamotrigine]; Lipitor [atorvastatin]; Lyrica [pregabalin]; Neurontin [gabapentin]; Other; Oxycodone hcl; Pravastatin; Sertraline; Topamax [topiramate]; Tramadol; Zonisamide; Cephalosporins; Erythromycin; Keflex [cephalexin]; and Vancomycin   Review of Systems Review of Systems No sore throat + cough No pleuritic pain No wheezing + nasal congestion + post-nasal drainage ? sinus pain/pressure No itchy/red eyes + earache No hemoptysis No SOB + fever, + chills No nausea No vomiting No abdominal pain No diarrhea No urinary symptoms No skin rash + fatigue No myalgias + headache Used OTC meds without relief   Physical Exam Triage Vital Signs ED Triage Vitals [09/15/17 1141]  Enc Vitals Group     BP (!) 143/84     Pulse Rate 79     Resp 18     Temp 98.1 F (36.7 C)     Temp Source Oral     SpO2 96 %     Weight 260 lb (117.9 kg)     Height 5' 8.5" (1.74 m)     Head Circumference      Peak Flow      Pain Score 0     Pain Loc       Pain Edu?      Excl. in [place]?    No data found.  Updated Vital Signs BP (!) 143/84 (BP Location: Right Arm)   Pulse 79   Temp 98.1 F (36.7 C) (Oral)   Resp 18   Ht 5' 8.5" (1.74 m)   Wt 260 lb (117.9 kg)   SpO2 96%   BMI 38.96 kg/m   Visual Acuity Right Eye Distance:   Left Eye Distance:   Bilateral Distance:    Right Eye Near:   Left Eye Near:    Bilateral Near:     Physical Exam Nursing notes and Vital Signs reviewed. Appearance:  Patient appears stated age, and in no acute distress Eyes:  Pupils are equal, round, and reactive to light and accomodation.  Extraocular movement is intact.  Conjunctivae are not inflamed  Ears:  Canals normal.  Tympanic membranes normal.  Nose:  Mildly congested turbinates.  No sinus tenderness.  Pharynx:  Normal Neck:  Supple.  Enlarged posterior/lateral nodes are palpated bilaterally, tender to palpation on the left.   Lungs:  Clear to auscultation.  Breath sounds are equal.  Moving air well. Heart:  Regular rate and rhythm without murmurs, rubs, or gallops.  Abdomen:  Nontender without masses or hepatosplenomegaly.  Bowel sounds are present.  No CVA or flank tenderness.  Extremities:  No edema.  Skin:  No rash present.    UC Treatments / Results  Labs (all labs ordered are listed, but only abnormal results are displayed) Labs Reviewed  POCT INFLUENZA A/B negative    EKG  EKG Interpretation None       Radiology No results found.  Procedures Procedures (including critical care time)  Medications Ordered in UC Medications - No data to display   Initial Impression / Assessment and Plan / UC Course  I have reviewed the triage vital signs and the nursing notes.  Pertinent labs & imaging results that were available during my care of the patient were reviewed by me and considered in my medical decision making (see chart for details).  There is no evidence of bacterial infection today.  Treat symptomatically for now   Take plain guaifenesin (1200mg  extended release tabs such as Mucinex) twice daily, with plenty of water, for cough and congestion.  May add Pseudoephedrine (30mg , one or two every 4 to 6 hours) for sinus congestion.  Get adequate rest.   May use Afrin nasal spray (or generic oxymetazoline) each morning for about 5 days and then discontinue.  Also recommend using saline nasal spray several times daily and saline nasal irrigation (AYR is a common brand).   Try warm salt water gargles for sore throat.  Stop all antihistamines for now, and other non-prescription cough/cold preparations. May take Delsym Cough Suppressant at bedtime for nighttime cough.  Followup with Family Doctor if not improved in about 6 days.   Final Clinical Impressions(s) / UC Diagnoses   Final diagnoses:  Viral URI with cough    ED Discharge Orders    None           Kandra Nicolas, MD 09/16/17 [phone]

## 2017-09-17 ENCOUNTER — Telehealth: Payer: Self-pay | Admitting: Emergency Medicine

## 2017-09-17 MED ORDER — LEVOFLOXACIN 500 MG PO TABS: ORAL_TABLET | ORAL | 0 refills | Status: DC

## 2017-09-17 NOTE — Telephone Encounter (Signed)
-----   Message from Curtis Sites, RN sent at 09/17/2017 10:50 AM EDT ----- Patient states she is not improving; has productive cough brown-ish; her strep dna negative; wonders if she can have antibiotic.

## 2017-09-17 NOTE — Telephone Encounter (Signed)
Spoke with patient to tell her rx had been sent in for an antibiotic and she says she went to her PCP after speaking with me this morning and was given rx.

## 2017-09-17 NOTE — Telephone Encounter (Signed)
I reviewed notes from visit here with Dr. Assunta Found 3/12. Reviewed that she is allergic to erythromycin and cephalosporins. Therefore, I will E prescribe Levaquin 500 mg daily times 7 days. Follow-up with PCP if no better in 4-5 days

## 2017-09-19 ENCOUNTER — Other Ambulatory Visit: Payer: Self-pay | Admitting: Neurology

## 2017-09-22 ENCOUNTER — Encounter: Payer: Self-pay | Admitting: *Deleted

## 2017-09-24 ENCOUNTER — Ambulatory Visit
Admission: RE | Admit: 2017-09-24 | Discharge: 2017-09-24 | Disposition: A | Discharge status: Home / Self Care | Payer: 59 | Source: Ambulatory Visit | Attending: Neurology | Admitting: Neurology

## 2017-09-24 DIAGNOSIS — G35 Multiple sclerosis: Secondary | ICD-10-CM | POA: Diagnosis not present

## 2017-09-24 MED ORDER — GADOBENATE DIMEGLUMINE 529 MG/ML IV SOLN
20.0000 mL | Freq: Once | INTRAVENOUS | Status: AC | PRN
  Administered 2017-09-24: 20 mL via INTRAVENOUS

## 2017-09-27 ENCOUNTER — Encounter: Payer: Self-pay | Admitting: Neurology

## 2017-09-28 ENCOUNTER — Encounter: Payer: Self-pay | Admitting: *Deleted

## 2017-09-28 NOTE — Telephone Encounter (Signed)
-----   Message from Britt Bottom, MD sent at 09/28/2017  1:07 PM EDT ----- Please let him know that the MRI does not show anything.

## 2017-10-05 ENCOUNTER — Telehealth: Payer: Self-pay | Admitting: *Deleted

## 2017-10-05 NOTE — Telephone Encounter (Signed)
Fax received from [facility]. Joan Ray has been approved to participate in the [facility] Copay Program. Enrollment will continue until the patient is no longer eligible, stops therapy, or no longer needs assistance.  The copay program will contribute up to $250 for each infusion and a max of $3,250 per year to assist with the copay and/or cost associated with each infusion. the pt. will be responsible for 100% of the balance cosst or copay associated with each infusion beyond the $250 infusion assistance provided by the [facility] Copay Program.  Fax given to [place] in the infusion suite, and copy sent to be scanned into emr/fim

## 2017-10-12 ENCOUNTER — Encounter: Payer: Self-pay | Admitting: Neurology

## 2017-11-07 ENCOUNTER — Other Ambulatory Visit: Payer: Self-pay | Admitting: Neurology

## 2017-11-11 ENCOUNTER — Other Ambulatory Visit: Payer: Self-pay | Admitting: Neurology

## 2017-12-05 ENCOUNTER — Other Ambulatory Visit: Payer: Self-pay | Admitting: Neurology

## 2017-12-15 ENCOUNTER — Other Ambulatory Visit: Payer: Self-pay | Admitting: Neurology

## 2017-12-28 ENCOUNTER — Encounter: Payer: Self-pay | Admitting: *Deleted

## 2018-01-03 ENCOUNTER — Other Ambulatory Visit: Payer: Self-pay | Admitting: Neurology

## 2018-02-04 ENCOUNTER — Other Ambulatory Visit: Payer: Self-pay | Admitting: Neurology

## 2018-02-05 ENCOUNTER — Other Ambulatory Visit: Payer: Self-pay | Admitting: *Deleted

## 2018-02-08 ENCOUNTER — Encounter: Payer: Self-pay | Admitting: Neurology

## 2018-02-09 MED ORDER — PREGABALIN 100 MG PO CAPS: 100.0000 mg | ORAL_CAPSULE | Freq: Two times a day (BID) | ORAL | 1 refills | Status: DC

## 2018-03-18 ENCOUNTER — Ambulatory Visit: Payer: 59 | Admitting: Neurology

## 2018-03-19 ENCOUNTER — Ambulatory Visit: Payer: 59 | Admitting: Neurology

## 2018-03-19 ENCOUNTER — Other Ambulatory Visit: Payer: Self-pay

## 2018-03-19 ENCOUNTER — Encounter: Payer: Self-pay | Admitting: Neurology

## 2018-03-19 VITALS — BP 139/79 | HR 72 | Resp 18 | Ht 68.5 in | Wt 260.0 lb

## 2018-03-19 DIAGNOSIS — G43009 Migraine without aura, not intractable, without status migrainosus: Secondary | ICD-10-CM

## 2018-03-19 DIAGNOSIS — R208 Other disturbances of skin sensation: Secondary | ICD-10-CM | POA: Diagnosis not present

## 2018-03-19 DIAGNOSIS — F418 Other specified anxiety disorders: Secondary | ICD-10-CM

## 2018-03-19 DIAGNOSIS — H539 Unspecified visual disturbance: Secondary | ICD-10-CM

## 2018-03-19 DIAGNOSIS — R269 Unspecified abnormalities of gait and mobility: Secondary | ICD-10-CM

## 2018-03-19 DIAGNOSIS — R5383 Other fatigue: Secondary | ICD-10-CM

## 2018-03-19 DIAGNOSIS — Z8669 Personal history of other diseases of the nervous system and sense organs: Secondary | ICD-10-CM

## 2018-03-19 DIAGNOSIS — G35 Multiple sclerosis: Secondary | ICD-10-CM | POA: Diagnosis not present

## 2018-03-19 MED ORDER — PREGABALIN 100 MG PO CAPS: 100.0000 mg | ORAL_CAPSULE | Freq: Two times a day (BID) | ORAL | 1 refills | Status: DC

## 2018-03-19 MED ORDER — DULOXETINE HCL 60 MG PO CPEP: 120.0000 mg | ORAL_CAPSULE | Freq: Every day | ORAL | 5 refills | Status: DC

## 2018-03-19 MED ORDER — CELECOXIB 200 MG PO CAPS: 200.0000 mg | ORAL_CAPSULE | Freq: Two times a day (BID) | ORAL | 3 refills | Status: DC

## 2018-03-19 MED ORDER — CYCLOBENZAPRINE HCL 10 MG PO TABS: ORAL_TABLET | ORAL | 1 refills | Status: DC

## 2018-03-19 NOTE — Progress Notes (Signed)
[facility]  PATIENT: Joan Ray DOB: October 08, 1960  REFERRING DOCTOR OR PCP:  Melina Schools [phone]) [name] (PCP) SOURCE: Patient, MRI images on PACS and CD and notes from PCP and ortho  _________________________________   HISTORICAL  CHIEF COMPLAINT:  Chief Complaint  Patient presents with  . Multiple Sclerosis    Sts. she continues to tolerate [place] well.  JCV ab last checked 09/10/17 and was Indeterminate at 0.23.  Inhibition assay was Negative.  She is concerned about blurry vision bilat for at least 3 wks. Has an eye exam last wk. and will have them fax notes to us/fim    HISTORY OF PRESENT ILLNESS:  Joan Ray is a 57 y.o. right-handed patient with MS diagnosed in 2010.     Update 03/19/2018: She is on Tysabri and she tolerates it well.  She has not had any exacerbations.  Her last JCV antibody titer was 0.23 and the inhibition assay was negative.  Her gait is usually ok but she has one bad fall and tore her hamstring.   An injection helped for a while.  She denies much weakness in the legs but she does note some weakness in the hands at times.  She has dysesthetic pain in her legs and hands.    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  Additionally, she is on duloxetine 120 mg that may help her dysesthetic pain.  She has mild urge incontinence.   Vision is blurry when she uses eyes a lt like at work with computer screen.   There is a gray halo around some objects and VF sometimes seems off.  She has seen ophthalmology and she states they recommended another MRI.    She notes fatigue.  She has some depression and anxiety, helped by Cymbalta.  Cognition is doing fairly well.  She also has rare migraine headaches and other more frequent milder headaches  She has done better on amlodipine.   She also has myalgias and joint pain  especially knees.   She is on Cymbalta, Lyrica and Celebrex.    She feels symptoms were much better on brand name Cymbalta.     She is doing aquatic therapy and trying a diet and has lost 9 pounds.        Update 09/10/2017: She feels her MS is stable.    She is on Tysabri 300 mg q 4 weeks and tolerates it well.   Her last JCV Ab was negative at 0.26.    Gait and leg strength are fine for the most part.  She is experiencing more dysesthetic pain as well as joint pain. She has some wrist / hand weakness at times.    .   She notes foot pain is doing worse.   She is on Lyrica.    She tolerates 100 mg bid but 300 mg causes cognitive slowing.    She is also on Duloxetine 120 mg.      per about her function is about the same. She notes some fatigue mood has been better on Cymbalta. The brand works better than the generic but insurance would not cover it. Cognition is fine.  Celebrex helps her arthritic pain but not the nerve pain.    She does better with the brand than the generic.       She  has migraines.  Generic amlodipine did not help as much as the brand but brand is very expensive.   Calan SR 180 mg is not available currently.       She has some weight loss on phentermine plus metformin but not much.   She is not a candidate for gastric bypass due to gastric polyps.      From 03/12/2017: MS:   She is on Tysabri and tolerates it well..  She has been JCV Ab negative.  MRI 09/19/2015 showed no definite new lesions compared to 07/02/2010.  Migraine:  She reports migraines did better on brand name Norvasc than on amlodipine.      Neck/back pain/CTS:   She also has neck pain and back pain.  She also has carpal tunnel syndrome (on EMG by Dr. Starleen Blue but Dr. Nelva Bush felt due to shoulder impingement (by her report) Also more knee pain.     Insurance won't cover name brand Celebrex.  Other:   She is undergoing evaluation for bariatric surgery but stomach polyps were noted so this is on hold.  She is exercising and  taking phentermine but weight is not improving.    Gait/strength: Gait is stable. She occasionally stumbles but has no falls..  No weakness and no numbness in legs.    Bladder: She has urinary frequency and urgency. This is mild.   No incontinence.     Vision: She had a good recovery from the optic neuritis. She denies any significant problems with her vision now. She does wear glasses. There'll be some fluctuations in her vision at times.  Fatigue/sleep: She has fatigue and also notes excessive sweating.    She has insomnia, but with melatonin and clonazepam (often needs 0.5 mg x 2) she falls asleep easily but wakes up and then can't fall back asleep.    RLS symptoms are much better on clonazepam and Lyrica. A sleep study in the past did not show sleep apnea but did show periodic limb movements of sleep.    GERD is a problem at night.    Mood/cognition: She has mild depression and anxiety and has been helped by Cymbalta.   She was switched to generic Cymbalta but it has not helped as much as the brand name.  . She notes mild cognitive issues.    Shehas poor attention and is easily distracted.    She has mild difficulty with executive function.    She has not had formal neurocognitive testing.    Migraine:   She has had migraine headaches and was on Norvasc with benefit However, it was switched to generic and migraines worsened.     Spine/limb pain history:    She reports having  L5-S1 fusion in October 2014 and C5-C7 fusion June 2016.   Although some symptoms improved after surgery, she continues to have a lot of difficulties with neck pain, arm pain, back pain and leg pain.    She was placed on Lyrica for her pain and was titrated to 200 mg twice a day but could not tolerate that dose. She has since been reduced to 100 mg by mouth twice a day and tolerates that dose but continues to have pain.      She reports some axial neck and lower back pain but the dysesthetic pain in the hands and feet is more  troublesome.    Both hands have the dysesthetic pain but in the legs, the right foot is much worse than the left.  She gets some benefit from Lyrica.   She felt her pain had been better on Cymbalta than on Effexor. However, in 2013 when she was having more neurologic issues the Cymbalta was discontinued.   In the right foot, the pain is most intense on the top of the foot and towards the ankle but not above the ankle.    I personally reviewed the MRI of the cervical spine from 10/07/2014.   It shows degenerative changes at C5-C6 resulting in left foraminal narrowing and changes at C6-C7 resulting right foraminal narrowing. The spinal cord appears normal. Plain films done after surgery showed a C5-C7 fusion and lumbar plain films showed the L5-S1 fusion.  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 She also had pain in the left eye. She went to an ophthalmologist who did some tests and referred her or an MRI. The MRI was abnormal, consistent with MS and she was referred to Dr. Maye Hides.   Usually, she was placed on weekly Avonex injections. She did not like the way that she felt on the injections. Additionally, an MRI of the brain showed that she had 2 new lesions. Therefore, she was switched to Tysabri in 2011.     On Tysabri, she has had no definite exacerbations, no change in MRI. Additionally she tolerates the medication very well. She has had some low positive JCV antibody titers. We do not have the actual numbers.    She switched from Dr. Starleen Blue to Dr. William Hamburger in [place] in 2013. She is interested in changing to an [facility] [facility].    Last MRI was early 2016 at [facility] in [place].     REVIEW OF SYSTEMS: Constitutional: No fevers, chills, sweats, or change in appetite.    She has insomnia and headaches and restless leg syndrome she reports a lot of fatigue. Eyes: No visual changes, double vision, eye pain Ear, nose and throat: No hearing  loss, ear pain, nasal congestion, sore throat Cardiovascular: No chest pain, palpitations Respiratory: No shortness of breath at rest or with exertion.   No wheezes GastrointestinaI:  She has constipation.   No nausea, vomiting, diarrhea, abdominal pain, fecal incontinence Genitourinary: No dysuria, urinary retention or frequency.  No nocturia. Musculoskeletal: as above Integumentary: No rash, skin lesions.   Some prurutis Neurological: as above Psychiatric: Notes depression > anxiety Endocrine: No palpitations, diaphoresis, change in appetite, change in weigh or increased thirst Hematologic/Lymphatic: No anemia, purpura, petechiae. Allergic/Immunologic: No itchy/runny eyes, nasal congestion, recent allergic reactions, rashes  ALLERGIES: Allergies  Allergen Reactions  . Hydrocodone-Acetaminophen Itching  . Progesterone Other (See Comments) and Rash    increase/profuse sweating-flushing-  . Sertraline Hcl Other (See Comments)  . Statins Swelling    muscle and joint pain/tightness  . Betadine [Povidone Iodine]     Blisters  . Codeine Itching    Rash  . Darvocet [Propoxyphene N-Acetaminophen] Nausea And Vomiting  . Diazepam     Increases agitation  . Dilaudid [Hydromorphone Hcl] Itching  . Doxycycline     Facial flushing  . Keppra [Levetiracetam]     Shaking, nervousness, palpations and flushing  . Lamictal [Lamotrigine]     Causes tongue and mouth to tingle  . Lipitor [Atorvastatin]     Severe muscle and joint pain / ms relapse  . Lyrica [Pregabalin] Other (See Comments)    Doses over 300mg  decrease memory  . Neurontin [Gabapentin]     Impaired cognition,  slower motor skills (intolerant)  . Other     EDTA causes blisters  . Oxycodone Hcl Itching  . Pravastatin Other (See Comments)    Severe joint pain MS relapse  . Sertraline Other (See Comments)    Increased nervousness  . Topamax [Topiramate]     Confusion  . Tramadol Other (See Comments)    "Nervous, jittery,  felt weird, nausea"  . Zonisamide     Back pain, headaches, insomnia, chest pressure  . Cephalosporins Rash  . Erythromycin Itching and Rash  . Keflex [Cephalexin] Rash  . Vancomycin Rash    Pt stated, " I can't tolerate Vancomycin I get a red rash."    HOME MEDICATIONS:  Current Outpatient Medications:  .  celecoxib (CELEBREX) 200 MG capsule, Take 1 capsule (200 mg total) by mouth 2 (two) times daily., Disp: 180 capsule, Rfl: 3 .  cetirizine (ZYRTEC) 10 MG tablet, Take 10 mg by mouth daily., Disp: , Rfl:  .  clonazePAM (KLONOPIN) 0.5 MG tablet, TAKE 1 TABLET BY MOUTH TWICE DAILY AS NEEDED, Disp: 60 tablet, Rfl: 5 .  cyclobenzaprine (FLEXERIL) 10 MG tablet, take 1 tablet by mouth three times a day if needed, Disp: 270 tablet, Rfl: 1 .  diclofenac sodium (VOLTAREN) 1 % GEL, Apply 1 application topically as needed., Disp: , Rfl:  .  docusate sodium (COLACE) 100 MG capsule, Take 100 mg by mouth 2 (two) times daily., Disp: , Rfl:  .  DULoxetine (CYMBALTA) 60 MG capsule, TAKE 2 CAPSULES BY MOUTH EVERY DAY, Disp: 60 capsule, Rfl: 5 .  esomeprazole (NEXIUM) 40 MG capsule, Take 40 mg by mouth at bedtime., Disp: , Rfl:  .  hydrochlorothiazide (MICROZIDE) 12.5 MG capsule, Take 12.5 mg by mouth daily., Disp: , Rfl:  .  lidocaine (LIDODERM) 5 %, apply 3 patches to affected area daily as directed, Disp: 90 patch, Rfl: 6 .  Melatonin 10 MG CAPS, Take 10 mg by mouth at bedtime. , Disp: , Rfl:  .  natalizumab (TYSABRI) 300 MG/15ML injection, Inject 300 mg into the vein every 30 (thirty) days. Takes for MS, Disp: , Rfl:  .  NORVASC 10 MG tablet, Take 1 tablet (10 mg total) by mouth daily., Disp: 90 tablet, Rfl: 3 .  pregabalin (LYRICA) 100 MG capsule, Take 1 capsule (100 mg total) by mouth 2 (two) times daily., Disp: 180 capsule, Rfl: 1 .  rizatriptan (MAXALT) 10 MG tablet, Take 10 mg by mouth every 2 (two) hours as needed for migraine. May repeat in 2 hours if needed, Disp: , Rfl:  .  traMADol (ULTRAM)  50 MG tablet, Take 1 tablet (50 mg total) by mouth every 6 (six) hours as needed., Disp: 30 tablet, Rfl: 1 .  verapamil (CALAN SR) 180 MG CR tablet, Take 1 tablet (180 mg total) by mouth at bedtime., Disp: 30 tablet, Rfl: 11 No current facility-administered medications for this visit.   Facility-Administered Medications Ordered in Other Visits:  .  gadopentetate dimeglumine (MAGNEVIST) injection 20 mL, 20 mL, Intravenous, Once PRN, Sater, Nanine Means, MD  PAST MEDICAL HISTORY: Past Medical History:  Diagnosis Date  . Anemia    h/o of   Last infusion of iron  2008  . Anxiety   . Arthritis    osteo   . Complication of anesthesia   . Constipation   . Depression    d/t decreased activity  . GERD (gastroesophageal reflux disease)   . Headache(784.0)    migraines  . High triglycerides   .  MS (multiple sclerosis) ([place])   . Neuromuscular disorder ([place])    mutliple sclerosis--dx 10/2008  . PONV (postoperative nausea and vomiting)   . Restless leg syndrome   . Wears glasses   . Wears glasses     PAST SURGICAL HISTORY: Past Surgical History:  Procedure Laterality Date  . ANTERIOR CERVICAL DECOMP/DISCECTOMY FUSION N/A 12/21/2014   Procedure: ANTERIOR CERVICAL DECOMPRESSION/DISCECTOMY FUSION 2 LEVELS C5-7;  Surgeon: Melina Schools, MD;  Location: [facility];  Service: Orthopedics;  Laterality: N/A;  . BLADDER SUSPENSION     occas  blood in urine  . ENDOMETRIAL ABLATION  2010  . FOOT ARTHRODESIS Right 07/28/2013   Procedure: ARTHRODESIS RIGHT MIDFOOT OF THE FIRST -  THIRD TARSOMETATARSAL JOINTS ;  Surgeon: Wylene Simmer, MD;  Location: [facility];  Service: Orthopedics;  Laterality: Right;  . GASTROCNEMIUS RECESSION Right 07/28/2013   Procedure: RIGHT GASTROCNEMIUS RECESSION;  Surgeon: Wylene Simmer, MD;  Location: [facility];  Service: Orthopedics;  Laterality: Right;  . JOINT REPLACEMENT  2012   lt total knee  . KNEE ARTHROSCOPY     bilateral  in 2010, has multiple  scopes  . MEDIAL PARTIAL KNEE REPLACEMENT     right  . nerve blocks    . SPINAL FUSION  05-05-2013  . stress fracture     right 2-3 toe  . TOTAL KNEE ARTHROPLASTY  2008   partial rt     FAMILY HISTORY: Family History  Problem Relation Age of Onset  . Heart disease Mother   . Cancer Mother   . Hypertension Mother   . Depression Mother   . Heart disease Other   . Hypertension Maternal Grandmother   . Diabetes Maternal Grandmother   . Kidney disease Maternal Grandmother   . Rheum arthritis Maternal Grandmother   . Depression Maternal Grandmother   . Hypertension Paternal Grandmother   . Kidney disease Maternal Grandfather   . Depression Maternal Grandfather   . Depression Brother     SOCIAL HISTORY:  Social History   Socioeconomic History  . Marital status: Married    Spouse name: Not on file  . Number of children: 3  . Years of education: Not on file  . Highest education level: Not on file  Occupational History  . Not on file  Social Needs  . Financial resource strain: Not on file  . Food insecurity:    Worry: Not on file    Inability: Not on file  . Transportation needs:    Medical: Not on file    Non-medical: Not on file  Tobacco Use  . Smoking status: Never Smoker  . Smokeless tobacco: Never Used  Substance and Sexual Activity  . Alcohol use: Yes    Comment: socially--wine & beer  . Drug use: No  . Sexual activity: Not on file  Lifestyle  . Physical activity:    Days per week: Not on file    Minutes per session: Not on file  . Stress: Not on file  Relationships  . Social connections:    Talks on phone: Not on file    Gets together: Not on file    Attends religious service: Not on file    Active member of club or organization: Not on file    Attends meetings of clubs or organizations: Not on file    Relationship status: Not on file  . Intimate partner violence:    Fear of current or ex partner: Not on file  Emotionally abused: Not on file     Physically abused: Not on file    Forced sexual activity: Not on file  Other Topics Concern  . Not on file  Social History Narrative   Lives at home with   Caffeine use:      PHYSICAL EXAM  Vitals:   03/19/18 1039  BP: 139/79  Pulse: 72  Resp: 18  Weight: 260 lb (117.9 kg)  Height: 5' 8.5" (1.74 m)    Body mass index is 38.96 kg/m.   General: The patient is well-developed and well-nourished and in no acute distress.  The head is normocephalic and atraumatic.  The neck is mildly tender.  Knees are mildly tender.  She has surgical scars.  Neurologic Exam  Mental status: The patient is alert and oriented x 3 at the time of the examination. The patient has apparent normal recent and remote memory, with an apparently normal attention span and concentration ability.   Speech is normal.  Cranial nerves: Extraocular movements are full.  Facial strength and facial sensation is normal.  Trapezius strength is normal.  The tongue is midline, and the patient has symmetric elevation of the soft palate. No obvious hearing deficits are noted.  Motor:  Muscle bulk is normal.   Tone is normal. Strength is  5 / 5 in all 4 extremities x 4+/5 right EHL  Sensory: Sensory testing is intact to pinprick, soft touch and vibration sensation in arms.  There is allodynia in the L5 distribution of the right foot associated with numbness.   Sensation is better in the left foot.    Coordination: Cerebellar testing reveals good finger-nose-finger and heel-to-shin bilaterally.  Gait and station: Station is normal.   Gait and the tandem gait are wide.  Romberg is negative.   Reflexes: Deep tendon reflexes are symmetric and normal bilaterally.          DIAGNOSTIC DATA (LABS, IMAGING, TESTING) - I reviewed patient records, labs, notes, testing and imaging myself where available.  Lab Results  Component Value Date   WBC 6.9 09/10/2017   HGB 13.0 09/10/2017   HCT 37.5 09/10/2017   MCV 83 09/10/2017     PLT 184 09/10/2017      Component Value Date/Time   NA 140 12/07/2014 0918   K 4.3 12/07/2014 0918   CL 106 12/07/2014 0918   CO2 26 12/07/2014 0918   GLUCOSE 106 (H) 12/07/2014 0918   BUN 16 12/07/2014 0918   CREATININE 0.79 12/07/2014 0918   CALCIUM 9.2 12/07/2014 0918   PROT 6.8 04/27/2013 0916   ALBUMIN 4.0 04/27/2013 0916   AST 19 04/27/2013 0916   ALT 15 04/27/2013 0916   ALKPHOS 64 04/27/2013 0916   BILITOT 0.4 04/27/2013 0916   GFRNONAA >60 12/07/2014 0918   GFRAA >60 12/07/2014 0918      ASSESSMENT AND PLAN  Multiple sclerosis ([place]) - Plan: Stratify JCV Antibody Test ([facility]), CBC with Differential/Platelet, MR BRAIN W WO CONTRAST, Comprehensive metabolic panel  Common migraine without intractability  Dysesthesia  Depression with anxiety  Other fatigue  History of optic neuritis  Gait disturbance  Morbid obesity (H[facility])  Vision disturbance   1.   Continue Tysabri.  We will need to check a JCV antibody and CBC..   We will check an MRI of the brain to determine if there is any subclinical progression. If present, we will need to consider a different disease modifying therapy.  2.  She will continue medications for her  myalgia, other pain and migraine.    3.  She is advised to stay active and exercises as tolerated. 4.   Return in 6 months or sooner if there are new or worsening neurologic symptoms.  45-minute face-to-face evaluation with greater than one half the time counseling and coordinating care regarding her worsening pain and MS symptoms.  Richard A. [name], MD, PhD 4/58/4835, 07:57 AM Certified in Neurology, Clinical Neurophysiology, Sleep Medicine, Pain Medicine and Neuroimaging  [facility] [address] [place], [place] [address] [phone]

## 2018-03-20 LAB — CBC WITH DIFFERENTIAL/PLATELET
BASOS: 0 %
Basophils Absolute: 0 10*3/uL (ref 0.0–0.2)
EOS (ABSOLUTE): 0.2 10*3/uL (ref 0.0–0.4)
EOS: 3 %
HEMATOCRIT: 38.6 % (ref 34.0–46.6)
Hemoglobin: 12.9 g/dL (ref 11.1–15.9)
IMMATURE GRANS (ABS): 0.1 10*3/uL (ref 0.0–0.1)
IMMATURE GRANULOCYTES: 1 %
LYMPHS: 41 %
Lymphocytes Absolute: 2.9 10*3/uL (ref 0.7–3.1)
MCH: 29.7 pg (ref 26.6–33.0)
MCHC: 33.4 g/dL (ref 31.5–35.7)
MCV: 89 fL (ref 79–97)
MONOS ABS: 0.5 10*3/uL (ref 0.1–0.9)
Monocytes: 6 %
NEUTROS ABS: 3.5 10*3/uL (ref 1.4–7.0)
NRBC: 2 % — ABNORMAL HIGH (ref 0–0)
Neutrophils: 49 %
Platelets: 229 10*3/uL (ref 150–450)
RBC: 4.34 x10E6/uL (ref 3.77–5.28)
RDW: 15 % (ref 12.3–15.4)
WBC: 7.1 10*3/uL (ref 3.4–10.8)

## 2018-03-20 LAB — COMPREHENSIVE METABOLIC PANEL
ALT: 23 IU/L (ref 0–32)
AST: 21 IU/L (ref 0–40)
Albumin/Globulin Ratio: 2 (ref 1.2–2.2)
Albumin: 4.3 g/dL (ref 3.5–5.5)
Alkaline Phosphatase: 98 IU/L (ref 39–117)
BILIRUBIN TOTAL: 0.7 mg/dL (ref 0.0–1.2)
BUN/Creatinine Ratio: 20 (ref 9–23)
BUN: 15 mg/dL (ref 6–24)
CALCIUM: 9.5 mg/dL (ref 8.7–10.2)
CHLORIDE: 99 mmol/L (ref 96–106)
CO2: 25 mmol/L (ref 20–29)
CREATININE: 0.74 mg/dL (ref 0.57–1.00)
GFR calc Af Amer: 103 mL/min/{1.73_m2} (ref >59)
GFR, EST NON AFRICAN AMERICAN: 90 mL/min/{1.73_m2} (ref >59)
Globulin, Total: 2.1 g/dL (ref 1.5–4.5)
Glucose: 104 mg/dL — ABNORMAL HIGH (ref 65–99)
Potassium: 4.3 mmol/L (ref 3.5–5.2)
Sodium: 140 mmol/L (ref 134–144)
Total Protein: 6.4 g/dL (ref 6.0–8.5)

## 2018-03-22 ENCOUNTER — Telehealth: Payer: Self-pay | Admitting: Neurology

## 2018-03-22 NOTE — Telephone Encounter (Signed)
[employer] Auth: NPR via uhc website order sent to GI. They will reach out to the pt to schedule.

## 2018-03-23 ENCOUNTER — Telehealth: Payer: Self-pay | Admitting: *Deleted

## 2018-03-23 NOTE — Telephone Encounter (Signed)
LMOM for Whitley--I received a request from [facility] to do a PA for her Lyrica. [facility] initiated a PA on Cover My Meds. I attempted to complete the PA on Cover My Meds, but received a message that PA is not able to be completed b/c [employer] does not review pa's for this plan, and that I should refer to the # on the back of pt's ins. card.  I checked Aurielle's insurance card and it sts. [employer] is the [employer] officer for her plan. I called them, but was told that they can not locate any info for pt. and they do not handle med pa's for her plan. I need Latitia to contact member services and determine if her benefits manager has changed. I can't complete a pa for her Lyrica until we have her correct [employer].  [facility] will need this info as well, as they still have [employer] listed as well/fim

## 2018-04-01 ENCOUNTER — Other Ambulatory Visit: Payer: 59

## 2018-04-05 ENCOUNTER — Telehealth: Payer: Self-pay | Admitting: *Deleted

## 2018-04-05 NOTE — Telephone Encounter (Signed)
Pt [place] form on Faith desk.

## 2018-04-07 ENCOUNTER — Telehealth: Payer: Self-pay | Admitting: *Deleted

## 2018-04-07 DIAGNOSIS — Z0289 Encounter for other administrative examinations: Secondary | ICD-10-CM

## 2018-04-07 NOTE — Telephone Encounter (Signed)
FMLA forms completed and returned to Medical Records/fim 

## 2018-04-07 NOTE — Telephone Encounter (Signed)
FMLA forms faxed to [employer] at [phone].

## 2018-04-13 ENCOUNTER — Ambulatory Visit
Admission: RE | Admit: 2018-04-13 | Discharge: 2018-04-13 | Disposition: A | Discharge status: Home / Self Care | Payer: 59 | Source: Ambulatory Visit | Attending: Neurology | Admitting: Neurology

## 2018-04-13 DIAGNOSIS — G35 Multiple sclerosis: Secondary | ICD-10-CM

## 2018-04-13 MED ORDER — GADOBENATE DIMEGLUMINE 529 MG/ML IV SOLN
20.0000 mL | Freq: Once | INTRAVENOUS | Status: AC | PRN
  Administered 2018-04-13: 20 mL via INTRAVENOUS

## 2018-04-14 ENCOUNTER — Telehealth: Payer: Self-pay | Admitting: *Deleted

## 2018-04-14 NOTE — Telephone Encounter (Signed)
Advised patient of previous message and she had no questions. °

## 2018-04-14 NOTE — Telephone Encounter (Signed)
-----   Message from Britt Bottom, MD sent at 04/13/2018  8:23 PM EDT ----- Please let her know that the MRI appears unchanged compared to her last one.  There are no new lesions.

## 2018-04-14 NOTE — Telephone Encounter (Signed)
Left patient a detailed message, with results, on voicemail (ok per DPR).  Provided our number to call back with any questions.  

## 2018-06-28 ENCOUNTER — Telehealth: Payer: Self-pay | Admitting: *Deleted

## 2018-06-28 NOTE — Telephone Encounter (Signed)
Received fax notification from touch prescribing program that pt re-authorized 06/28/18-01/28/19. Pt enrollment number: [id number]. Account: [employer]. Site auth number: [id number].

## 2018-06-28 NOTE — Telephone Encounter (Signed)
Faxed completed/signed Tysabri pt status report and reauth questionnaire to [facility] at [phone]. Received confirmation.  

## 2018-07-01 ENCOUNTER — Telehealth: Payer: Self-pay | Admitting: Neurology

## 2018-07-01 NOTE — Telephone Encounter (Signed)
Pt states she her pregabalin (LYRICA) 100 MG capsule has gone generic she has a constant increase in nerve pain, there is a burning pain(like fire) in her hands, legs and feet.  Pt also states she is having left hip and joint pain if she sits for more than 30 mins. Please call

## 2018-07-01 NOTE — Telephone Encounter (Addendum)
Spoke with Dr. [name]. He is not comfortable adding another medication at this time. He wants to verify if pt is taking tramadol. Last prescribed in 2018. If not, can offer rx for 1 tablet daily prn #30, 1 refills. Can also offer referral to PT to see if this will help with sx.   I checked drug registry and appears she received oxycodone-acetaminophen 5-325mg  #10 from [facility] on 04/27/18. Need to verify if she is taking this still.

## 2018-07-01 NOTE — Telephone Encounter (Addendum)
Spoke with Dr. [name]. Can try and increase her dose to150 twice daily- generic to see if this will help her symptoms.

## 2018-07-01 NOTE — Telephone Encounter (Signed)
I called pt back. Relayed Dr. Garth Bigness recommendation but she is hesitant to try and increase Lyrica dose. She has previously been on higher doses and it messed with her cognition. She does not want to do this since she works full times as a Marine scientist. She would like a different option.  She is having more intense burning pain in feet. Feels like her foot is on fire. Wearing compression stockings but ineffective and has gotten new shoes but ineffective.  Left hip is bothering her as well. She has not fallen since Mother's day. She cannot lay on her left side which is new. She has a ache in her hip. Lidocaine patches not covered by insurances so she was not able to try these.   Advised I will speak with Dr. [name] and call back with recommendations. She verbalized understanding.

## 2018-07-01 NOTE — Telephone Encounter (Signed)
I tried calling pt back. Went to VM, VM full

## 2018-07-02 NOTE — Telephone Encounter (Addendum)
Spoke to patient.  She informed me she took one tablet of oxycodone and flushed the rest of the medication.  She does not like the way it makes her feel and she doesn't want that type of medication in her house.  She took one tablet of Tramadol and she flushed the rest of the medication.  States it makes her feel nervous and jittery.  She declined the offer for PT.  She declined the offer to increase Lyrica to 150mg  BID.  However, she would like to continue Lyrica 100mg  in the morning and increase her evening dose to 150mg .  She is requesting a new prescription for Lyrica 50mg , one tablet at bedtime (brand only) to be taken along with her Lyrica 100mg  capsules.  Ok, per vo by Dr. [name], to provide new prescription for Lyrica 50mg , one tablet to take at bedtime to take along with her Lyrica 100mg  for a total evening dose of 150mg .  She will continue Lyrica 100mg , one capsule in the mornings.

## 2018-07-02 NOTE — Addendum Note (Signed)
Addended by: Noberto Retort C on: 07/02/2018 11:41 AM   Modules accepted: Orders

## 2018-07-02 NOTE — Addendum Note (Signed)
Addended by: Noberto Retort C on: 07/02/2018 11:49 AM   Modules accepted: Orders

## 2018-07-05 ENCOUNTER — Encounter: Payer: Self-pay | Admitting: *Deleted

## 2018-07-05 MED ORDER — PREGABALIN 50 MG PO CAPS: 50.0000 mg | ORAL_CAPSULE | Freq: Every day | ORAL | 5 refills | Status: DC

## 2018-07-05 NOTE — Telephone Encounter (Signed)
Patient calling back stating the Rx for Lyrica 100 at the pharmacy is for generic and she wants a new Rx for Lyrica 100mg  brand name sent  to [facility] in [place].

## 2018-07-05 NOTE — Telephone Encounter (Signed)
Okay to take additional Lyrica 50 mg qHS along with 100 mg bid.

## 2018-07-05 NOTE — Addendum Note (Signed)
Addended by: Star Age on: 07/05/2018 07:47 AM   Modules accepted: Orders

## 2018-07-05 NOTE — Telephone Encounter (Signed)
Patient requesting new Rx for pregabalin (LYRICA) 100 MG capsule brand name sent to [facility] in [place].

## 2018-07-05 NOTE — Telephone Encounter (Signed)
I called the pharmacy and spoke to [place] again.  He verified the Lyrica prescriptions on file is for brand name.  Her next refill is not due until 07/07/2018 so the problem today was that insurance would not pay the claim until that date.  Marya Amsler stated he has explained this to the patient.  She was also sent a mychart message to provide her an update of this issue.

## 2018-07-05 NOTE — Telephone Encounter (Addendum)
Our records indicate that she has a refill for Lyrica 100mg  tablets.  I called [facility] and verified with Marya Amsler that she does have the refill on file.  He will get the prescriptions, for both doses of Lyrica, ready for pick up.  I called the patient back and left a message on her voicemail with this information (ok per DPR).  Provided our number to call back with any questions.

## 2018-07-20 ENCOUNTER — Telehealth: Payer: Self-pay | Admitting: Neurology

## 2018-07-20 ENCOUNTER — Other Ambulatory Visit: Payer: Self-pay | Admitting: *Deleted

## 2018-07-20 MED ORDER — LYRICA 150 MG PO CAPS: 150.0000 mg | ORAL_CAPSULE | Freq: Two times a day (BID) | ORAL | 3 refills | Status: DC

## 2018-07-20 NOTE — Telephone Encounter (Signed)
Patient received your message about Friday to come between 10-30- and 11:00 Patient is coming.

## 2018-07-20 NOTE — Telephone Encounter (Addendum)
I called [facility], [place] at [phone] and spoke with [facility]. She verified PA was needed for Brand Lyrica 100mg  BID. Advised we will work on this for the pt. Pt updated insurance benefit info:  [place]#[id number] RxBIN: [id number], PCN: [id number], Group: [id number] Phone#[phone]  I checked drug registry. She last refilled Lyrica 50mg  #30 on 07/05/2018 and Lyrica 100mg  #60 06/14/2018.

## 2018-07-26 ENCOUNTER — Telehealth: Payer: Self-pay | Admitting: *Deleted

## 2018-07-26 NOTE — Telephone Encounter (Signed)
Per [employer], brand name Lyrica is a plan exclusion with pt's [facility] plan. Generic Lyrica is covered without a PA.  I have faxed this information to [facility]; pt. will need to take generic/fim

## 2018-08-20 ENCOUNTER — Telehealth: Payer: Self-pay | Admitting: Neurology

## 2018-08-20 NOTE — Telephone Encounter (Signed)
pt has called for the intrafusion suite, call transferred °

## 2018-08-25 ENCOUNTER — Other Ambulatory Visit: Payer: Self-pay | Admitting: Neurology

## 2018-09-08 ENCOUNTER — Other Ambulatory Visit: Payer: Self-pay | Admitting: Neurology

## 2018-09-17 ENCOUNTER — Telehealth: Payer: Self-pay | Admitting: *Deleted

## 2018-09-17 NOTE — Telephone Encounter (Signed)
Pt was in office today for infusion. She had questions about refund she is supposed to be getting for her infusions. I emailed Sharmon Leyden and nurses with intrafusion also emailed her and intake department to f/u on this.  She was last seen 03/19/18 and has no pending f/u I called and set up f/u with Amy L, NP on 09/24/18 at 10am, check in 930am. She requested a Friday appt. Pt having no new sx. Still having SI joint pain. She is following up on this.

## 2018-09-18 ENCOUNTER — Other Ambulatory Visit: Payer: Self-pay | Admitting: Neurology

## 2018-09-21 NOTE — Progress Notes (Deleted)
PATIENT: Joan Ray DOB: 1961-02-20  REASON FOR VISIT: follow up HISTORY FROM: patient  No chief complaint on file.    HISTORY OF PRESENT ILLNESS: Today 09/21/18 Joan Ray is a 58 y.o. female here today for follow up for MS. She is receiving Tysabri infusions .     HISTORY: (copied from Dr Garth Bigness note on 03/19/2018) Joan Ray is a 58 y.o. right-handed patient with MS diagnosed in 2010.      Update 03/19/2018: She is on Tysabri and she tolerates it well.  She has not had any exacerbations.  Her last JCV antibody titer was 0.23 and the inhibition assay was negative.  Her gait is usually ok but she has one bad fall and tore her hamstring.   An injection helped for a while.  She denies much weakness in the legs but she does note some weakness in the hands at times.  She has dysesthetic pain in her legs and hands.    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  Additionally, she is on duloxetine 120 mg that may help her dysesthetic pain.  She has mild urge incontinence.   Vision is blurry when she uses eyes a lt like at work with computer screen.   There is a gray halo around some objects and VF sometimes seems off.  She has seen ophthalmology and she states they recommended another MRI.    She notes fatigue.  She has some depression and anxiety, helped by Cymbalta.  Cognition is doing fairly well.  She also has rare migraine headaches and other more frequent milder headaches  She has done better on amlodipine.   She also has myalgias and joint pain especially knees.   She is on Cymbalta, Lyrica and Celebrex.    She feels symptoms were much better on brand name Cymbalta.     She is doing aquatic therapy and trying a diet and has lost 9 pounds.        Update 09/10/2017: She feels her MS is stable.    She is on Tysabri 300 mg q 4 weeks and tolerates it well.    Her last JCV Ab was negative at 0.26.    Gait and leg strength are fine for the most part.  She is experiencing more dysesthetic pain as well as joint pain. She has some wrist / hand weakness at times.    .   She notes foot pain is doing worse.   She is on Lyrica.    She tolerates 100 mg bid but 300 mg causes cognitive slowing.    She is also on Duloxetine 120 mg.      per about her function is about the same. She notes some fatigue mood has been better on Cymbalta. The brand works better than the generic but insurance would not cover it. Cognition is fine.  Celebrex helps her arthritic pain but not the nerve pain.    She does better with the brand than the generic.       She has migraines.  Generic amlodipine did not help as much as the brand but brand is very expensive.   Calan SR 180 mg is not available currently.       She has some weight loss on phentermine plus metformin but not much.   She is not a  candidate for gastric bypass due to gastric polyps.      From 03/12/2017: MS:   She is on Tysabri and tolerates it well..  She has been JCV Ab negative.  MRI 09/19/2015 showed no definite new lesions compared to 07/02/2010.  Migraine:  She reports migraines did better on brand name Norvasc than on amlodipine.      Neck/back pain/CTS:   She also has neck pain and back pain.  She also has carpal tunnel syndrome (on EMG by Dr. Starleen Blue but Dr. Nelva Bush felt due to shoulder impingement (by her report) Also more knee pain.     Insurance won't cover name brand Celebrex.  Other:   She is undergoing evaluation for bariatric surgery but stomach polyps were noted so this is on hold.  She is exercising and taking phentermine but weight is not improving.    Gait/strength: Gait is stable. She occasionally stumbles but has no falls..  No weakness and no numbness in legs.    Bladder: She has urinary frequency and urgency. This is mild.   No incontinence.     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 She denies any significant problems with her vision now. She does wear glasses. Therell be some fluctuations in her vision at times.  Fatigue/sleep: She has fatigue and also notes excessive sweating.    She has insomnia, but with melatonin and clonazepam (often needs 0.5 mg x 2) she falls asleep easily but wakes up and then can't fall back asleep.    RLS symptoms are much better on clonazepam and Lyrica. A sleep study in the past did not show sleep apnea but did show periodic limb movements of sleep.    GERD is a problem at night.    Mood/cognition: She has mild depression and anxiety and has been helped by Cymbalta.   She was switched to generic Cymbalta but it has not helped as much as the brand name.  . She notes mild cognitive issues.    Shehas poor attention and is easily distracted.    She has mild difficulty with executive function.    She has not had formal neurocognitive testing.    Migraine:   She has had migraine headaches and was on Norvasc with benefit However, it was switched to generic and migraines worsened.     Spine/limb pain history:    She reports having  L5-S1 fusion in October 2014 and C5-C7 fusion June 2016.   Although some symptoms improved after surgery, she continues to have a lot of difficulties with neck pain, arm pain, back pain and leg pain.    She was placed on Lyrica for her pain and was titrated to 200 mg twice a day but could not tolerate that dose. She has since been reduced to 100 mg by mouth twice a day and tolerates that dose but continues to have pain.      She reports some axial neck and lower back pain but the dysesthetic pain in the hands and feet is more troublesome.    Both hands have the dysesthetic pain but in the legs, the right foot is much worse than the left.   She gets some benefit from Lyrica.   She felt her pain had been better on Cymbalta than on Effexor. However, in 2013 when she was having more neurologic issues the Cymbalta was  discontinued.   In the right foot, the pain is most intense on the top of the foot  and towards the ankle but not above the ankle.    I personally reviewed the MRI of the cervical spine from 10/07/2014.   It shows degenerative changes at C5-C6 resulting in left foraminal narrowing and changes at C6-C7 resulting right foraminal narrowing. The spinal cord appears normal. Plain films done after surgery showed a C5-C7 fusion and lumbar plain films showed the L5-S1 fusion.  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 She also had pain in the left eye. She went to an ophthalmologist who did some tests and referred her or an MRI. The MRI was abnormal, consistent with MS and she was referred to Dr. Maye Hides.   Usually, she was placed on weekly Avonex injections. She did not like the way that she felt on the injections. Additionally, an MRI of the brain showed that she had 2 new lesions. Therefore, she was switched to Tysabri in 2011.     On Tysabri, she has had no definite exacerbations, no change in MRI. Additionally she tolerates the medication very well. She has had some low positive JCV antibody titers. We do not have the actual numbers.    She switched from Dr. Starleen Blue to Dr. William Hamburger in [place] in 2013. She is interested in changing to an [place].    Last MRI was early 2016 at [facility] in [place].    REVIEW OF SYSTEMS: Out of a complete 14 system review of symptoms, the patient complains only of the following symptoms, and all other reviewed systems are negative.  ALLERGIES: Allergies  Allergen Reactions   Hydrocodone-Acetaminophen Itching   Progesterone Other (See Comments) and Rash    increase/profuse sweating-flushing-   Sertraline Hcl Other (See Comments)   Statins Swelling    muscle and joint pain/tightness   Betadine [Povidone Iodine]     Blisters   Codeine Itching    Rash   Darvocet [Propoxyphene N-Acetaminophen]  Nausea And Vomiting   Diazepam     Increases agitation   Dilaudid [Hydromorphone Hcl] Itching   Doxycycline     Facial flushing   Keppra [Levetiracetam]     Shaking, nervousness, palpations and flushing   Lamictal [Lamotrigine]     Causes tongue and mouth to tingle   Lipitor [Atorvastatin]     Severe muscle and joint pain / ms relapse   Lyrica [Pregabalin] Other (See Comments)    Doses over 300mg  decrease memory   Neurontin [Gabapentin]     Impaired cognition, slower motor skills (intolerant)   Other     EDTA causes blisters   Oxycodone Hcl Itching   Pravastatin Other (See Comments)    Severe joint pain MS relapse   Sertraline Other (See Comments)    Increased nervousness   Topamax [Topiramate]     Confusion   Tramadol Other (See Comments)    "Nervous, jittery, felt weird, nausea"   Zonisamide     Back pain, headaches, insomnia, chest pressure   Cephalosporins Rash   Erythromycin Itching and Rash   Keflex [Cephalexin] Rash   Vancomycin Rash    Pt stated, " I can't tolerate Vancomycin I get a red rash."    HOME MEDICATIONS: Outpatient Medications Prior to Visit  Medication Sig Dispense Refill   celecoxib (CELEBREX) 200 MG capsule Take 1 capsule (200 mg total) by mouth 2 (two) times daily. BRAND NAME NECESSARY 180 capsule 3   cetirizine (ZYRTEC) 10 MG tablet Take 10 mg by mouth daily.  clonazePAM (KLONOPIN) 0.5 MG tablet TAKE 1 TABLET BY MOUTH TWICE DAILY AS NEEDED 60 tablet 0   cyclobenzaprine (FLEXERIL) 10 MG tablet take 1 tablet by mouth three times a day if needed 270 tablet 1   diclofenac sodium (VOLTAREN) 1 % GEL Apply 1 application topically as needed.     docusate sodium (COLACE) 100 MG capsule Take 100 mg by mouth 2 (two) times daily.     DULoxetine (CYMBALTA) 60 MG capsule Take 2 capsules (120 mg total) by mouth daily. 60 capsule 5   esomeprazole (NEXIUM) 40 MG capsule Take 40 mg by mouth at bedtime.     hydrochlorothiazide  (MICROZIDE) 12.5 MG capsule Take 12.5 mg by mouth daily.     lidocaine (LIDODERM) 5 % apply 3 patches to affected area daily as directed 90 patch 6   LYRICA 150 MG capsule Take 1 capsule (150 mg total) by mouth 2 (two) times daily. 60 capsule 3   Melatonin 10 MG CAPS Take 10 mg by mouth at bedtime.      natalizumab (TYSABRI) 300 MG/15ML injection Inject 300 mg into the vein every 30 (thirty) days. Takes for MS     pregabalin (LYRICA) 100 MG capsule Take 1 capsule (100 mg total) by mouth 2 (two) times daily. 180 capsule 1   pregabalin (LYRICA) 50 MG capsule Take 1 capsule (50 mg total) by mouth at bedtime. 30 capsule 5   rizatriptan (MAXALT) 10 MG tablet Take 10 mg by mouth every 2 (two) hours as needed for migraine. May repeat in 2 hours if needed     verapamil (CALAN-SR) 180 MG CR tablet TAKE ONE TABLET BY MOUTH EVERY DAY AT BEDTIME 30 tablet 0   Facility-Administered Medications Prior to Visit  Medication Dose Route Frequency Provider Last Rate Last Dose   gadopentetate dimeglumine (MAGNEVIST) injection 20 mL  20 mL Intravenous Once PRN Sater, Nanine Means, MD        PAST MEDICAL HISTORY: Past Medical History:  Diagnosis Date   Anemia    h/o of   Last infusion of iron  2008   Anxiety    Arthritis    osteo    Complication of anesthesia    Constipation    Depression    d/t decreased activity   GERD (gastroesophageal reflux disease)    Headache(784.0)    migraines   High triglycerides    MS (multiple sclerosis) (HCC)    Neuromuscular disorder ([place])    mutliple sclerosis--dx 10/2008   PONV (postoperative nausea and vomiting)    Restless leg syndrome    Wears glasses    Wears glasses     PAST SURGICAL HISTORY: Past Surgical History:  Procedure Laterality Date   ANTERIOR CERVICAL DECOMP/DISCECTOMY FUSION N/A 12/21/2014   Procedure: ANTERIOR CERVICAL DECOMPRESSION/DISCECTOMY FUSION 2 LEVELS C5-7;  Surgeon: Melina Schools, MD;  Location: [facility];  Service:  Orthopedics;  Laterality: N/A;   BLADDER SUSPENSION     occas  blood in urine   ENDOMETRIAL ABLATION  2010   FOOT ARTHRODESIS Right 07/28/2013   Procedure: ARTHRODESIS RIGHT MIDFOOT OF THE FIRST -  [place] ;  Surgeon: Wylene Simmer, MD;  Location: [facility];  Service: Orthopedics;  Laterality: Right;   GASTROCNEMIUS RECESSION Right 07/28/2013   Procedure: RIGHT GASTROCNEMIUS RECESSION;  Surgeon: Wylene Simmer, MD;  Location: [facility];  Service: Orthopedics;  Laterality: Right;   JOINT REPLACEMENT  2012   lt total knee   KNEE ARTHROSCOPY  bilateral  in 2010, has multiple scopes   MEDIAL PARTIAL KNEE REPLACEMENT     right   nerve blocks     SPINAL FUSION  05-05-2013   stress fracture     right 2-3 toe   TOTAL KNEE ARTHROPLASTY  2008   partial rt     FAMILY HISTORY: Family History  Problem Relation Age of Onset   Heart disease Mother    Cancer Mother    Hypertension Mother    Depression Mother    Heart disease Other    Hypertension Maternal Grandmother    Diabetes Maternal Grandmother    Kidney disease Maternal Grandmother    Rheum arthritis Maternal Grandmother    Depression Maternal Grandmother    Hypertension Paternal Grandmother    Kidney disease Maternal Grandfather    Depression Maternal Grandfather    Depression Brother     SOCIAL HISTORY: Social History   Socioeconomic History   Marital status: Married    Spouse name: Not on file   Number of children: 3   Years of education: Not on file   Highest education level: Not on file  Occupational History   Not on file  Social Needs   Financial resource strain: Not on file   Food insecurity:    Worry: Not on file    Inability: Not on file   Transportation needs:    Medical: Not on file    Non-medical: Not on file  Tobacco Use   Smoking status: Never Smoker   Smokeless tobacco: Never Used  Substance and Sexual Activity    Alcohol use: Yes    Comment: socially--wine & beer   Drug use: No   Sexual activity: Not on file  Lifestyle   Physical activity:    Days per week: Not on file    Minutes per session: Not on file   Stress: Not on file  Relationships   Social connections:    Talks on phone: Not on file    Gets together: Not on file    Attends religious service: Not on file    Active member of club or organization: Not on file    Attends meetings of clubs or organizations: Not on file    Relationship status: Not on file   Intimate partner violence:    Fear of current or ex partner: Not on file    Emotionally abused: Not on file    Physically abused: Not on file    Forced sexual activity: Not on file  Other Topics Concern   Not on file  Social History Narrative   Lives at home with   Caffeine use:       PHYSICAL EXAM  There were no vitals filed for this visit. There is no height or weight on file to calculate BMI.  Generalized: Well developed, in no acute distress  Cardiology: normal rate and rhythm, no murmur noted Neurological examination  Mentation: Alert oriented to time, place, history taking. Follows all commands speech and language fluent Cranial nerve II-XII: Pupils were equal round reactive to light. Extraocular movements were full, visual field were full on confrontational test. Facial sensation and strength were normal. Uvula tongue midline. Head turning and shoulder shrug  were normal and symmetric. Motor: The motor testing reveals 5 over 5 strength of all 4 extremities. Good symmetric motor tone is noted throughout.  Sensory: Sensory testing is intact to soft touch on all 4 extremities. No evidence of extinction is noted.  Coordination: Cerebellar testing  reveals good finger-nose-finger and heel-to-shin bilaterally.  Gait and station: Gait is normal. Tandem gait is normal. Romberg is negative. No drift is seen.  Reflexes: Deep tendon reflexes are symmetric and normal  bilaterally.   DIAGNOSTIC DATA (LABS, IMAGING, TESTING) - I reviewed patient records, labs, notes, testing and imaging myself where available.  No flowsheet data found.   Lab Results  Component Value Date   WBC 7.1 03/19/2018   HGB 12.9 03/19/2018   HCT 38.6 03/19/2018   MCV 89 03/19/2018   PLT 229 03/19/2018      Component Value Date/Time   NA 140 03/19/2018 1110   K 4.3 03/19/2018 1110   CL 99 03/19/2018 1110   CO2 25 03/19/2018 1110   GLUCOSE 104 (H) 03/19/2018 1110   GLUCOSE 106 (H) 12/07/2014 0918   BUN 15 03/19/2018 1110   CREATININE 0.74 03/19/2018 1110   CALCIUM 9.5 03/19/2018 1110   PROT 6.4 03/19/2018 1110   ALBUMIN 4.3 03/19/2018 1110   AST 21 03/19/2018 1110   ALT 23 03/19/2018 1110   ALKPHOS 98 03/19/2018 1110   BILITOT 0.7 03/19/2018 1110   GFRNONAA 90 03/19/2018 1110   GFRAA 103 03/19/2018 1110   No results found for: CHOL, HDL, LDLCALC, LDLDIRECT, TRIG, CHOLHDL No results found for: HGBA1C No results found for: VITAMINB12 No results found for: TSH     ASSESSMENT AND PLAN 57 y.o. year old female  has a past medical history of Anemia, Anxiety, Arthritis, Complication of anesthesia, Constipation, Depression, GERD (gastroesophageal reflux disease), Headache(784.0), High triglycerides, MS (multiple sclerosis) ([place]), Neuromuscular disorder ([place]), PONV (postoperative nausea and vomiting), Restless leg syndrome, Wears glasses, and Wears glasses. here with ***    ICD-10-CM   1. Multiple sclerosis ([place]) G35        No orders of the defined types were placed in this encounter.    No orders of the defined types were placed in this encounter.     I spent 15 minutes with the patient. 50% of this time was spent counseling and educating patient on plan of care and medications.    Debbora Presto, FNP-C 09/21/2018, 8:25 AM [facility] [address], [place], [place] [address] [phone]

## 2018-09-24 ENCOUNTER — Encounter: Payer: Self-pay | Admitting: Neurology

## 2018-09-24 ENCOUNTER — Ambulatory Visit: Payer: 59 | Admitting: Neurology

## 2018-09-24 ENCOUNTER — Other Ambulatory Visit: Payer: Self-pay

## 2018-09-24 VITALS — BP 129/67 | HR 85 | Ht 68.5 in | Wt 255.0 lb

## 2018-09-24 DIAGNOSIS — R269 Unspecified abnormalities of gait and mobility: Secondary | ICD-10-CM

## 2018-09-24 DIAGNOSIS — G35 Multiple sclerosis: Secondary | ICD-10-CM | POA: Diagnosis not present

## 2018-09-24 DIAGNOSIS — G43009 Migraine without aura, not intractable, without status migrainosus: Secondary | ICD-10-CM

## 2018-09-24 NOTE — Progress Notes (Signed)
JCV specimen placed in [facility] lock box for pick up.

## 2018-09-24 NOTE — Progress Notes (Signed)
I have read the note, and I agree with the clinical assessment and plan.  Monasia Lair A. Hudsen Fei, MD, PhD, FAAN Certified in Neurology, Clinical Neurophysiology, Sleep Medicine, Pain Medicine and Neuroimaging  [facility] [address], [address] [place], [place] [address] [phone]  

## 2018-09-24 NOTE — Progress Notes (Signed)
PATIENT: Joan Ray DOB: Dec 16, 1960  REASON FOR VISIT: follow up HISTORY FROM: patient  HISTORY OF PRESENT ILLNESS: Today 09/24/18  HISTORY  Joan Ray is a 58 y.o. right-handed patient with MS diagnosed in 2010.     Update September 24, 2018 SS: Joan Ray is a 58 year old female with history of MS since 2010. She is on Tysabri infusions and tolerating them well. She had MRI of the brain in October 2019 that did not show any changes, or new lesions. Her last JCV antibody was 0.35 (indeterminant) in September 2019, inhibition assay was negative.   She has complained of constant nerve pain, burning, in her hands, legs, and feet, was hesitant to increase Lyrica because of troubles with cognition at higher doses. Apparently she prefers brand name Lyrica versus generic. Her left hip has bothered her, has difficulty lying on her left side, she has an ache in her hip. Was unable to try lidocaine patches, unfortunately they weren't covered by her health insurance. She was offered Tramadol by Dr. [name] in December 2019 along with PT, had been given oxycodone-acetaminophen 5-325 by PCP in October 2019. She apparently flushed the tramadol and oxycodone down the toilet because she didn't like the way it made her feel. She declined PT. She did increase her Lyrica to 100 mg in the morning and 150 mg in the evening.   She is unaccompanied at today's appointment.  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  She reports she has been doing fairly well since her last visit in September 2019.  She is working closely with Dr. Nelva Bush, orthopedic doctor for management of her SI joint issues, left sided leg pain.  She reports at the end of February she saw his PA and was started on magnesium, zinc, Naltrexone 5 mg capsules for her left-sided leg pain.  She does also report the feeling of a "cyst" when she rubs deeply in her arms and on the side of her left leg. She reports initially  made the pain worse, now has helped with the pain.  She does report that she continues to take Lyrica brand-name 150 mg twice daily.  Fortunately, she has been able to tolerate this dose, has not had any major problems with cognitive side effects.  She does try to exercise, massage, use heating pad, hot tub for the pain.  She does report the last few weeks she has been sitting down at her desk at work, typing more.  This has aggravated her arthritis, left shoulder pain.  She has a upcoming appointment with Dr. Delilah Shan at the end of the month to evaluate her left shoulder to see if she needs a left shoulder replacement.   She does report that her headaches have been doing well. She continues taking verapamil, reports she has not had to take the Maxalt in a long time.  She reports her gait is stable.  She does report some gait changes as result of the SI joint, left-sided leg pain.  She denies any falls since last Mother's Day in 2019.  She does report occasionally she will have to use a walker or cane due to pain related to her SI joint.  She denies any recent changes in her vision.  She did see an eye doctor back in January 2019 and had a normal evaluation.  She did have to get new reading glasses within the last 6 months.  She thinks the change may have been  attributed to working more at her computer at work.  She does report urinary urgency, denies any incontinence.  She does report history of constipation but manages this with stool softeners and daily prophylactic laxative.  She reports she is sleeping fairly well at night.  She does take clonazepam at bedtime, 0.75 mg for sleep, melatonin.  She is tolerating medication well.  Overall she denies any new problems or concerns related to her MS.  Update 03/19/2018 Dr. [name]:  She is on Tysabri and she tolerates it well.  She has not had any exacerbations.  Her last JCV antibody titer was 0.23 and the inhibition assay was negative.  Her gait is  usually ok but she has one bad fall and tore her hamstring.   An injection helped for a while.  She denies much weakness in the legs but she does note some weakness in the hands at times.  She has dysesthetic pain in her legs and hands.    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  Additionally, she is on duloxetine 120 mg that may help her dysesthetic pain.  She has mild urge incontinence.   Vision is blurry when she uses eyes a lt like at work with computer screen.   There is a gray halo around some objects and VF sometimes seems off.  She has seen ophthalmology and she states they recommended another MRI.    She notes fatigue.  She has some depression and anxiety, helped by Cymbalta.  Cognition is doing fairly well.  She also has rare migraine headaches and other more frequent milder headaches  She has done better on amlodipine.   She also has myalgias and joint pain especially knees.   She is on Cymbalta, Lyrica and Celebrex.    She feels symptoms were much better on brand name Cymbalta.     She is doing aquatic therapy and trying a diet and has lost 9 pounds.        Update 09/10/2017: She feels her MS is stable.    She is on Tysabri 300 mg q 4 weeks and tolerates it well.   Her last JCV Ab was negative at 0.26.    Gait and leg strength are fine for the most part.  She is experiencing more dysesthetic pain as well as joint pain. She has some wrist / hand weakness at times.    .   She notes foot pain is doing worse.   She is on Lyrica.    She tolerates 100 mg bid but 300 mg causes cognitive slowing.    She is also on Duloxetine 120 mg.      per about her function is about the same. She notes some fatigue mood has been better on Cymbalta. The brand works better than the generic but insurance would not cover it. Cognition is fine.  Celebrex helps her arthritic pain but not the nerve pain.     She does better with the brand than the generic.       She has migraines.  Generic amlodipine did not help as much as the brand but brand is very expensive.   Calan SR 180 mg is not available currently.       She has some weight loss on phentermine plus metformin but not much.   She is not a candidate for  gastric bypass due to gastric polyps.      From 03/12/2017: MS:   She is on Tysabri and tolerates it well..  She has been JCV Ab negative.  MRI 09/19/2015 showed no definite new lesions compared to 07/02/2010.  Migraine:  She reports migraines did better on brand name Norvasc than on amlodipine.      Neck/back pain/CTS:   She also has neck pain and back pain.  She also has carpal tunnel syndrome (on EMG by Dr. Starleen Blue but Dr. Nelva Bush felt due to shoulder impingement (by her report) Also more knee pain.     Insurance won't cover name brand Celebrex.  Other:   She is undergoing evaluation for bariatric surgery but stomach polyps were noted so this is on hold.  She is exercising and taking phentermine but weight is not improving.    Gait/strength: Gait is stable. She occasionally stumbles but has no falls..  No weakness and no numbness in legs.    Bladder: She has urinary frequency and urgency. This is mild.   No incontinence.     Vision: She had a good recovery from the optic neuritis. She denies any significant problems with her vision now. She does wear glasses. Therell be some fluctuations in her vision at times.  Fatigue/sleep: She has fatigue and also notes excessive sweating.    She has insomnia, but with melatonin and clonazepam (often needs 0.5 mg x 2) she falls asleep easily but wakes up and then can't fall back asleep.    RLS symptoms are much better on clonazepam and Lyrica. A sleep study in the past did not show sleep apnea but did show periodic limb movements of sleep.    GERD is a problem at night.    Mood/cognition: She has mild depression and anxiety and has been helped  by Cymbalta.   She was switched to generic Cymbalta but it has not helped as much as the brand name.  . She notes mild cognitive issues.    Shehas poor attention and is easily distracted.    She has mild difficulty with executive function.    She has not had formal neurocognitive testing.    Migraine:   She has had migraine headaches and was on Norvasc with benefit However, it was switched to generic and migraines worsened.     Spine/limb pain history:    She reports having  L5-S1 fusion in October 2014 and C5-C7 fusion June 2016.   Although some symptoms improved after surgery, she continues to have a lot of difficulties with neck pain, arm pain, back pain and leg pain.    She was placed on Lyrica for her pain and was titrated to 200 mg twice a day but could not tolerate that dose. She has since been reduced to 100 mg by mouth twice a day and tolerates that dose but continues to have pain.      She reports some axial neck and lower back pain but the dysesthetic pain in the hands and feet is more troublesome.    Both hands have the dysesthetic pain but in the legs, the right foot is much worse than the left.   She gets some benefit from Lyrica.   She felt her pain had been better on Cymbalta than on Effexor. However, in 2013 when she was having more neurologic issues the Cymbalta was discontinued.   In the right foot, the pain is most intense on the top of the foot and towards the  ankle but not above the ankle.    I personally reviewed the MRI of the cervical spine from 10/07/2014.   It shows degenerative changes at C5-C6 resulting in left foraminal narrowing and changes at C6-C7 resulting right foraminal narrowing. The spinal cord appears normal. Plain films done after surgery showed a C5-C7 fusion and lumbar plain films showed the L5-S1 fusion.  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 She also had pain in the left eye. She went to an ophthalmologist who did some  tests and referred her or an MRI. The MRI was abnormal, consistent with MS and she was referred to Dr. Maye Hides.   Usually, she was placed on weekly Avonex injections. She did not like the way that she felt on the injections. Additionally, an MRI of the brain showed that she had 2 new lesions. Therefore, she was switched to Tysabri in 2011.     On Tysabri, she has had no definite exacerbations, no change in MRI. Additionally she tolerates the medication very well. She has had some low positive JCV antibody titers. We do not have the actual numbers.    She switched from Dr. Starleen Blue to Dr. William Hamburger in [place] in 2013. She is interested in changing to an [facility].    Last MRI was early 2016 at [facility] in [place].    REVIEW OF SYSTEMS: Out of a complete 14 system review of symptoms, the patient complains only of the following symptoms, and all other reviewed systems are negative.  Joint pain, joint swelling, back pain, aching muscles, muscle cramps, walking difficulty, neck pain, neck stiffness, agitation, depression  ALLERGIES: Allergies  Allergen Reactions   Bee Venom Swelling   Hydrocodone-Acetaminophen Itching   Progesterone Other (See Comments) and Rash    increase/profuse sweating-flushing-   Sertraline Hcl Other (See Comments)   Statins Swelling    muscle and joint pain/tightness   Trazodone Other (See Comments)    dizziness   Betadine [Povidone Iodine]     Blisters   Codeine Itching    Rash   Darvocet [Propoxyphene N-Acetaminophen] Nausea And Vomiting   Diazepam     Increases agitation   Dilaudid [Hydromorphone Hcl] Itching   Doxycycline     Facial flushing   Keppra [Levetiracetam]     Shaking, nervousness, palpations and flushing   Lamictal [Lamotrigine]     Causes tongue and mouth to tingle   Lipitor [Atorvastatin]     Severe muscle and joint pain / ms relapse   Lyrica [Pregabalin] Other (See Comments)    Doses over 300mg   decrease memory   Neurontin [Gabapentin]     Impaired cognition, slower motor skills (intolerant)   Other     EDTA causes blisters   Oxycodone Hcl Itching   Pravastatin Other (See Comments)    Severe joint pain MS relapse   Sertraline Other (See Comments)    Increased nervousness   Topamax [Topiramate]     Confusion   Tramadol Other (See Comments)    "Nervous, jittery, felt weird, nausea"   Zonisamide     Back pain, headaches, insomnia, chest pressure   Cephalosporins Rash   Erythromycin Itching and Rash   Keflex [Cephalexin] Rash   Vancomycin Rash    Pt stated, " I can't tolerate Vancomycin I get a red rash."    HOME MEDICATIONS: Outpatient Medications Prior to Visit  Medication Sig Dispense Refill   b complex vitamins tablet Take  1 tablet by mouth daily.     celecoxib (CELEBREX) 200 MG capsule Take 1 capsule (200 mg total) by mouth 2 (two) times daily. BRAND NAME NECESSARY 180 capsule 3   cetirizine (ZYRTEC) 10 MG tablet Take 10 mg by mouth daily.     Cholecalciferol (VITAMIN D3 PO) Vitamin D 5,000 unit tablet  Take by oral route.     clonazePAM (KLONOPIN) 0.5 MG tablet TAKE 1 TABLET BY MOUTH TWICE DAILY AS NEEDED 60 tablet 0   cyclobenzaprine (FLEXERIL) 10 MG tablet take 1 tablet by mouth three times a day if needed 270 tablet 1   diclofenac sodium (VOLTAREN) 1 % GEL Apply 1 application topically as needed.     docusate sodium (COLACE) 100 MG capsule Take 100 mg by mouth 2 (two) times daily.     DULoxetine (CYMBALTA) 60 MG capsule Take 2 capsules (120 mg total) by mouth daily. 60 capsule 5   esomeprazole (NEXIUM) 40 MG capsule Take 40 mg by mouth at bedtime.     fluticasone (FLONASE) 50 MCG/ACT nasal spray SHAKE LQ AND U 1 SPR IEN BID     hydrochlorothiazide (MICROZIDE) 12.5 MG capsule Take 12.5 mg by mouth daily.     lidocaine (LIDODERM) 5 % apply 3 patches to affected area daily as directed 90 patch 6   LYRICA 150 MG capsule Take 1 capsule (150  mg total) by mouth 2 (two) times daily. 60 capsule 3   Magnesium 500 MG CAPS Take 500 mg by mouth daily.     Melatonin 10 MG CAPS Take 10 mg by mouth at bedtime.      NALTREXONE HCL PO TAKE ONE CAPSULE (1MG ) BY MOUTH FOR THREE DAYS, THEN INCREASE BY 1MG  EVERY THREE DAYS TO REACH 5MG  AS TOLERATED.     natalizumab (TYSABRI) 300 MG/15ML injection Inject 300 mg into the vein every 30 (thirty) days. Takes for MS     Olopatadine HCl (PATADAY OP) Pataday     rizatriptan (MAXALT) 10 MG tablet Take 10 mg by mouth every 2 (two) hours as needed for migraine. May repeat in 2 hours if needed     verapamil (CALAN-SR) 180 MG CR tablet TAKE ONE TABLET BY MOUTH EVERY DAY AT BEDTIME 30 tablet 0   zinc gluconate 50 MG tablet Take 50 mg by mouth daily.     pregabalin (LYRICA) 100 MG capsule Take 1 capsule (100 mg total) by mouth 2 (two) times daily. 180 capsule 1   pregabalin (LYRICA) 50 MG capsule Take 1 capsule (50 mg total) by mouth at bedtime. 30 capsule 5   Facility-Administered Medications Prior to Visit  Medication Dose Route Frequency Provider Last Rate Last Dose   gadopentetate dimeglumine (MAGNEVIST) injection 20 mL  20 mL Intravenous Once PRN Sater, Nanine Means, MD        PAST MEDICAL HISTORY: Past Medical History:  Diagnosis Date   Anemia    h/o of   Last infusion of iron  2008   Anxiety    Arthritis    osteo    Complication of anesthesia    Constipation    Depression    d/t decreased activity   GERD (gastroesophageal reflux disease)    Headache(784.0)    migraines   High triglycerides    MS (multiple sclerosis) (HCC)    Neuromuscular disorder ([place])    mutliple sclerosis--dx 10/2008   PONV (postoperative nausea and vomiting)    Restless leg syndrome    Wears glasses    Wears  glasses     PAST SURGICAL HISTORY: Past Surgical History:  Procedure Laterality Date   ANTERIOR CERVICAL DECOMP/DISCECTOMY FUSION N/A 12/21/2014   Procedure: ANTERIOR CERVICAL  DECOMPRESSION/DISCECTOMY FUSION 2 LEVELS C5-7;  Surgeon: Melina Schools, MD;  Location: [facility];  Service: Orthopedics;  Laterality: N/A;   BLADDER SUSPENSION     occas  blood in urine   ENDOMETRIAL ABLATION  2010   FOOT ARTHRODESIS Right 07/28/2013   Procedure: ARTHRODESIS RIGHT MIDFOOT OF THE FIRST -  [place] ;  Surgeon: Wylene Simmer, MD;  Location: [facility];  Service: Orthopedics;  Laterality: Right;   GASTROCNEMIUS RECESSION Right 07/28/2013   Procedure: RIGHT GASTROCNEMIUS RECESSION;  Surgeon: Wylene Simmer, MD;  Location: [facility];  Service: Orthopedics;  Laterality: Right;   JOINT REPLACEMENT  2012   lt total knee   KNEE ARTHROSCOPY     bilateral  in 2010, has multiple scopes   MEDIAL PARTIAL KNEE REPLACEMENT     right   nerve blocks     SPINAL FUSION  05-05-2013   stress fracture     right 2-3 toe   TOTAL KNEE ARTHROPLASTY  2008   partial rt     FAMILY HISTORY: Family History  Problem Relation Age of Onset   Heart disease Mother    Cancer Mother    Hypertension Mother    Depression Mother    Heart disease Other    Hypertension Maternal Grandmother    Diabetes Maternal Grandmother    Kidney disease Maternal Grandmother    Rheum arthritis Maternal Grandmother    Depression Maternal Grandmother    Hypertension Paternal Grandmother    Kidney disease Maternal Grandfather    Depression Maternal Grandfather    Depression Brother     SOCIAL HISTORY: Social History   Socioeconomic History   Marital status: Married    Spouse name: Not on file   Number of children: 3   Years of education: Not on file   Highest education level: Not on file  Occupational History    Comment: Designer, multimedia strain: Not on file   Food insecurity:    Worry: Not on file    Inability: Not on file   Transportation needs:    Medical: Not on file    Non-medical: Not on file  Tobacco Use     Smoking status: Never Smoker   Smokeless tobacco: Never Used  Substance and Sexual Activity   Alcohol use: Yes    Comment: socially--wine & beer   Drug use: No   Sexual activity: Not on file  Lifestyle   Physical activity:    Days per week: Not on file    Minutes per session: Not on file   Stress: Not on file  Relationships   Social connections:    Talks on phone: Not on file    Gets together: Not on file    Attends religious service: Not on file    Active member of club or organization: Not on file    Attends meetings of clubs or organizations: Not on file    Relationship status: Not on file   Intimate partner violence:    Fear of current or ex partner: Not on file    Emotionally abused: Not on file    Physically abused: Not on file    Forced sexual activity: Not on file  Other Topics Concern   Not on file  Social History Narrative  Lives at home with   Caffeine use:       PHYSICAL EXAM  Vitals:   09/24/18 0937  BP: 129/67  Pulse: 85  Weight: 255 lb (115.7 kg)  Height: 5' 8.5" (1.74 m)   Body mass index is 38.21 kg/m.  Generalized: Well developed, in no acute distress   Neurological examination  Mentation: Alert oriented to time, place, history taking. Follows all commands speech and language fluent Cranial nerve II-XII: Pupils were equal round reactive to light. Extraocular movements were full, visual field were full on confrontational test. Facial sensation and strength were normal. Uvula tongue midline. Head turning and shoulder shrug  were normal and symmetric. Motor: The motor testing reveals 5 over 5 strength of all 4 extremities. Good symmetric motor tone is noted throughout.  Sensory: Sensory testing is intact to soft touch on all 4 extremities. No evidence of extinction is noted.  Coordination: Cerebellar testing reveals good finger-nose-finger and heel-to-shin bilaterally.  Gait and station: Needs to push off on chair handles to stand,  stands for a few seconds before ambulating, gait is antalgic on left side, tandem gait unsteady Reflexes: Deep tendon reflexes are symmetric and normal bilaterally.   DIAGNOSTIC DATA (LABS, IMAGING, TESTING) - I reviewed patient records, labs, notes, testing and imaging myself where available.  Lab Results  Component Value Date   WBC 7.1 03/19/2018   HGB 12.9 03/19/2018   HCT 38.6 03/19/2018   MCV 89 03/19/2018   PLT 229 03/19/2018      Component Value Date/Time   NA 140 03/19/2018 1110   K 4.3 03/19/2018 1110   CL 99 03/19/2018 1110   CO2 25 03/19/2018 1110   GLUCOSE 104 (H) 03/19/2018 1110   GLUCOSE 106 (H) 12/07/2014 0918   BUN 15 03/19/2018 1110   CREATININE 0.74 03/19/2018 1110   CALCIUM 9.5 03/19/2018 1110   PROT 6.4 03/19/2018 1110   ALBUMIN 4.3 03/19/2018 1110   AST 21 03/19/2018 1110   ALT 23 03/19/2018 1110   ALKPHOS 98 03/19/2018 1110   BILITOT 0.7 03/19/2018 1110   GFRNONAA 90 03/19/2018 1110   GFRAA 103 03/19/2018 1110   No results found for: CHOL, HDL, LDLCALC, LDLDIRECT, TRIG, CHOLHDL No results found for: HGBA1C No results found for: VITAMINB12 No results found for: TSH    ASSESSMENT AND PLAN 58 y.o. year old female  has a past medical history of Anemia, Anxiety, Arthritis, Complication of anesthesia, Constipation, Depression, GERD (gastroesophageal reflux disease), Headache(784.0), High triglycerides, MS (multiple sclerosis) ([place]), Neuromuscular disorder ([place]), PONV (postoperative nausea and vomiting), Restless leg syndrome, Wears glasses, and Wears glasses. here with:  1.  Multiple sclerosis 2.  Gait disturbance 3.  Migraines  Overall she is doing well today.  She will continue Tsabri monthly infusions.  I will check blood work today to include a JC virus antibody.  Her last JCV antibody in September 2019 was 0.35 (indeterminate), negative inhibition assay.  She will continue close follow-up with her orthopedic doctor, Dr. Nelva Bush who is managing her SI  joint and left sided leg pain. She had an SI joint injection recently on 09/22/2018. She was started on a new combination of medications for her leg pain, magnesium, zinc, naltrexone.  She had an MRI of the brain in October 2019 that did not show any changes.  She will continue her medications for her myalgias and migraines.  Her migraines are under good control taking verapamil daily, is not having to use Maxalt.  She reports she  does not need a refill on any of her medications.  She will follow-up in 6 months or sooner if needed.  We discussed the importance of being as active as possible, incorporating exercise.  I advised her that if her symptoms worsen or if she develops any new symptoms she should let us know.   I spent 25 minutes with the patient. 50% of this time was spent discussing her plan of care, reviewing her history.    Butler Denmark, AGNP-C, DNP 09/24/2018, 10:48 AM [facility] [address], [place], [place] [address] [phone]

## 2018-09-25 LAB — CBC WITH DIFFERENTIAL/PLATELET
Basophils Absolute: 0.1 10*3/uL (ref 0.0–0.2)
Basos: 1 %
EOS (ABSOLUTE): 0.2 10*3/uL (ref 0.0–0.4)
Eos: 3 %
Hematocrit: 38 % (ref 34.0–46.6)
Hemoglobin: 12.8 g/dL (ref 11.1–15.9)
IMMATURE GRANS (ABS): 0.1 10*3/uL (ref 0.0–0.1)
Immature Granulocytes: 1 %
Lymphocytes Absolute: 4.2 10*3/uL — ABNORMAL HIGH (ref 0.7–3.1)
Lymphs: 53 %
MCH: 29.2 pg (ref 26.6–33.0)
MCHC: 33.7 g/dL (ref 31.5–35.7)
MCV: 87 fL (ref 79–97)
Monocytes Absolute: 0.5 10*3/uL (ref 0.1–0.9)
Monocytes: 7 %
NRBC: 1 % — ABNORMAL HIGH (ref 0–0)
Neutrophils Absolute: 2.7 10*3/uL (ref 1.4–7.0)
Neutrophils: 35 %
Platelets: 219 10*3/uL (ref 150–450)
RBC: 4.39 x10E6/uL (ref 3.77–5.28)
RDW: 14.5 % (ref 11.7–15.4)
WBC: 7.8 10*3/uL (ref 3.4–10.8)

## 2018-09-29 ENCOUNTER — Telehealth: Payer: Self-pay | Admitting: *Deleted

## 2018-09-29 NOTE — Telephone Encounter (Signed)
Labs collected on 09/24/2018:  JCV ab: 0.21 H Indeterminate  Inhibition Assay: Negative

## 2018-10-05 ENCOUNTER — Telehealth: Payer: Self-pay | Admitting: Neurology

## 2018-10-05 NOTE — Telephone Encounter (Signed)
pt has called for the intrafusion suite, call transferred °

## 2018-10-11 ENCOUNTER — Other Ambulatory Visit: Payer: Self-pay | Admitting: Neurology

## 2018-10-11 ENCOUNTER — Other Ambulatory Visit: Payer: Self-pay

## 2018-10-11 ENCOUNTER — Other Ambulatory Visit: Payer: Self-pay | Admitting: *Deleted

## 2018-10-11 MED ORDER — DULOXETINE HCL 60 MG PO CPEP: 120.0000 mg | ORAL_CAPSULE | Freq: Every day | ORAL | 5 refills | Status: DC

## 2018-10-11 MED ORDER — VERAPAMIL HCL ER 180 MG PO TBCR: 180.0000 mg | EXTENDED_RELEASE_TABLET | Freq: Every day | ORAL | 5 refills | Status: DC

## 2018-10-11 MED ORDER — CLONAZEPAM 0.5 MG PO TABS: 0.5000 mg | ORAL_TABLET | Freq: Two times a day (BID) | ORAL | 0 refills | Status: DC | PRN

## 2018-10-11 NOTE — Telephone Encounter (Signed)
[facility] Database Verified LR: 08-25-2018 Qty: 60 Pending appointment: 04-01-2019

## 2018-10-25 ENCOUNTER — Other Ambulatory Visit: Payer: Self-pay | Admitting: *Deleted

## 2018-10-25 MED ORDER — VERAPAMIL HCL ER 180 MG PO TBCR: 180.0000 mg | EXTENDED_RELEASE_TABLET | Freq: Every day | ORAL | 1 refills | Status: DC

## 2018-10-25 MED ORDER — CLONAZEPAM 0.5 MG PO TABS: 0.5000 mg | ORAL_TABLET | Freq: Two times a day (BID) | ORAL | 0 refills | Status: DC | PRN

## 2018-10-25 MED ORDER — CYCLOBENZAPRINE HCL 10 MG PO TABS: ORAL_TABLET | ORAL | 1 refills | Status: DC

## 2018-10-25 MED ORDER — LYRICA 150 MG PO CAPS: 150.0000 mg | ORAL_CAPSULE | Freq: Two times a day (BID) | ORAL | 3 refills | Status: DC

## 2018-10-25 MED ORDER — DULOXETINE HCL 60 MG PO CPEP: 120.0000 mg | ORAL_CAPSULE | Freq: Every day | ORAL | 1 refills | Status: DC

## 2018-11-01 ENCOUNTER — Other Ambulatory Visit: Payer: Self-pay | Admitting: *Deleted

## 2018-11-01 MED ORDER — PREGABALIN 150 MG PO CAPS: 150.0000 mg | ORAL_CAPSULE | Freq: Two times a day (BID) | ORAL | 3 refills | Status: DC

## 2018-12-28 ENCOUNTER — Telehealth: Payer: Self-pay

## 2018-12-28 NOTE — Telephone Encounter (Signed)
Tysabri re-auth has been signed and faxed to [facility]. Fax # [phone], confirmation received.   Dr. Jannifer Franklin signed form due to Dr. [name] being out of the office.

## 2019-03-10 ENCOUNTER — Encounter: Payer: Self-pay | Admitting: Physical Medicine & Rehabilitation

## 2019-03-10 ENCOUNTER — Other Ambulatory Visit: Payer: Self-pay

## 2019-03-10 ENCOUNTER — Encounter: Payer: 59 | Attending: Physical Medicine & Rehabilitation | Admitting: Physical Medicine & Rehabilitation

## 2019-03-10 VITALS — BP 134/88 | HR 72 | Temp 97.7°F | Ht 68.5 in | Wt 274.8 lb

## 2019-03-10 DIAGNOSIS — M533 Sacrococcygeal disorders, not elsewhere classified: Secondary | ICD-10-CM

## 2019-03-10 DIAGNOSIS — M7072 Other bursitis of hip, left hip: Secondary | ICD-10-CM

## 2019-03-10 NOTE — Progress Notes (Signed)
Subjective:  Consult requested by Dr. Nelva Bush  Patient ID: Joan Ray, female    DOB: [birth year], 58 y.o.   MRN: [id number]  HPI  Chief complaint left buttock pain.  58yo female with balance and sensory issues from [place] with primary complaint of left sided back and buttock pain. Pt had Sacroiliac injection Left x 3 which gave a temporary improvement.  The patient had both corticosteroid as well as Marcaine only L4-5-S1 fusion per Dr Rolena Infante in 2014 The patient states that her left buttock pain started prior to her fusion. The patient uses different pads to try to [facility] that area.  She also tends to lean on her right buttocks when she is sitting to avoid putting pressure over the left buttocks area.  Shoulder replacement scheduled for 03/24/2019  Does home aquatic therapy  Pain Inventory Average Pain 6 Pain Right Now 5 My pain is intermittent, sharp, stabbing and aching  In the last 24 hours, has pain interfered with the following? General activity 8 Relation with others 10 Enjoyment of life 8 What TIME of day is your pain at its worst? evening and night Sleep (in general) Fair  Pain is worse with: walking, sitting and some activites Pain improves with: heat/ice and injections Relief from Meds: 0  Mobility walk without assistance how many minutes can you walk? 15-30 ability to climb steps?  yes do you drive?  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  no bone scan  Physicians involved in your care Primary care Dr. Maxcine Ham Neurologist Dr. Arlice Colt Orthopedist Dr. Onnie Graham and Ramos   Family History  Problem Relation Age of Onset  . Heart disease Mother   . Cancer Mother   .  Hypertension Mother   . Depression Mother   . Heart disease Other   . Hypertension Maternal Grandmother   . Diabetes Maternal Grandmother   . Kidney disease Maternal Grandmother   . Rheum arthritis Maternal Grandmother   . Depression Maternal Grandmother   . Hypertension Paternal Grandmother   . Kidney disease Maternal Grandfather   . Depression Maternal Grandfather   . Depression Brother    Social History   Socioeconomic History  . Marital status: Married    Spouse name: Not on file  . Number of children: 3  . Years of education: Not on file  . Highest education level: Not on file  Occupational History    Comment: RN  Social Needs  . Financial resource strain: Not on file  . Food insecurity    Worry: Not on file    Inability: Not on file  . Transportation needs    Medical: Not on file    Non-medical: Not on file  Tobacco Use  . Smoking status: Never Smoker  . Smokeless tobacco: Never Used  Substance and Sexual Activity  . Alcohol use: Yes    Comment: socially--wine & beer  . Drug use: No  . Sexual activity: Not on file  Lifestyle  . Physical activity    Days per week: Not on file    Minutes per session: Not on file  . Stress: Not on file  Relationships  . Social Herbalist on phone: Not on file    Gets  together: Not on file    Attends religious service: Not on file    Active member of club or organization: Not on file    Attends meetings of clubs or organizations: Not on file    Relationship status: Not on file  Other Topics Concern  . Not on file  Social History Narrative   Lives at home with   Caffeine use:    Past Surgical History:  Procedure Laterality Date  . ANTERIOR CERVICAL DECOMP/DISCECTOMY FUSION N/A 12/21/2014   Procedure: ANTERIOR CERVICAL DECOMPRESSION/DISCECTOMY FUSION 2 LEVELS C5-7;  Surgeon: Melina Schools, MD;  Location: [facility];  Service: Orthopedics;  Laterality: N/A;  . BLADDER SUSPENSION     occas  blood in urine  .  ENDOMETRIAL ABLATION  2010  . FOOT ARTHRODESIS Right 07/28/2013   Procedure: ARTHRODESIS RIGHT MIDFOOT OF THE FIRST -  THIRD TARSOMETATARSAL JOINTS ;  Surgeon: Wylene Simmer, MD;  Location: [facility];  Service: Orthopedics;  Laterality: Right;  . GASTROCNEMIUS RECESSION Right 07/28/2013   Procedure: RIGHT GASTROCNEMIUS RECESSION;  Surgeon: Wylene Simmer, MD;  Location: [facility];  Service: Orthopedics;  Laterality: Right;  . JOINT REPLACEMENT  2012   lt total knee  . KNEE ARTHROSCOPY     bilateral  in 2010, has multiple scopes  . MEDIAL PARTIAL KNEE REPLACEMENT     right  . nerve blocks    . SPINAL FUSION  05-05-2013  . stress fracture     right 2-3 toe  . TOTAL KNEE ARTHROPLASTY  2008   partial rt    Past Medical History:  Diagnosis Date  . Anemia    h/o of   Last infusion of iron  2008  . Anxiety   . Arthritis    osteo   . Complication of anesthesia   . Constipation   . Depression    d/t decreased activity  . GERD (gastroesophageal reflux disease)   . Headache(784.0)    migraines  . High triglycerides   . MS (multiple sclerosis) ([place])   . Neuromuscular disorder ([place])    mutliple sclerosis--dx 10/2008  . PONV (postoperative nausea and vomiting)   . Restless leg syndrome   . Wears glasses   . Wears glasses    BP 134/88   Pulse 72   Temp 97.7 F (36.5 C)   Ht 5' 8.5" (1.74 m)   Wt 274 lb 12.8 oz (124.6 kg)   SpO2 97%   BMI 41.18 kg/m   Opioid Risk Score:   Fall Risk Score:  `1  Depression screen PHQ 2/9  No flowsheet data found.   Review of Systems  Constitutional: Negative.   HENT: Negative.   Eyes: Negative.   Respiratory: Negative.   Cardiovascular: Negative.   Gastrointestinal: Negative.   Endocrine: Negative.   Genitourinary: Negative.   Musculoskeletal: Negative.   Skin: Negative.   Allergic/Immunologic: Negative.   Neurological: Negative.   Hematological: Negative.   Psychiatric/Behavioral: Negative.   All other  systems reviewed and are negative.      Objective:   Physical Exam Vitals signs and nursing note reviewed.  Constitutional:      Appearance: Normal appearance. She is obese.  HENT:     Head: Normocephalic and atraumatic.  Eyes:     Extraocular Movements: Extraocular movements intact.     Conjunctiva/sclera: Conjunctivae normal.     Pupils: Pupils are equal, round, and reactive to light.  Cardiovascular:     Rate and Rhythm: Normal rate and  regular rhythm.     Heart sounds: Normal heart sounds.  Pulmonary:     Effort: Pulmonary effort is normal. No respiratory distress.     Breath sounds: Normal breath sounds.  Abdominal:     General: Abdomen is flat. Bowel sounds are normal.     Palpations: Abdomen is soft.     Tenderness: There is no abdominal tenderness.  Musculoskeletal:     Comments: There is no tenderness palpation over the PSIS area Faber's causes pain on the left side but it is more in the ischial tuberosity area than in the PSIS.  There is no pain in the groin area. Negative straight leg raising bilaterally There is no tenderness palpation over the lumbar paraspinal area.  Hip internal/external rotation is mildly diminished but not painful  Skin:    General: Skin is warm and dry.  Neurological:     Mental Status: She is alert and oriented to person, place, and time.     Sensory: Sensation is intact.     Motor: Motor function is intact.     Comments: Motor strength is 5/5 bilateral deltoid bicep tricep grip hip flexor knee extensor ankle dorsiflexor Sensation normal bilateral lower extremities to light touch   Tenderness over the ischial tuberosity on the left side.        Assessment & Plan:  1.  Chronic left buttock pain.  Her exam is most consistent with a ischial bursitis however she has responded with 50% pain relief to sacroiliac injections even with Marcaine only. We discussed that in order to evaluate potential efficacy of sacroiliac radiofrequency  ablation, would need to perform left L5 dorsal ramus injection left S1 is to S3 lateral branch blocks using 2% lidocaine.  If this provides a 50% pain relief in the left buttock area, then would be a good candidate for radiofrequency neurotomy of the same nerves.  Cooled RF would be preferred modality.  If the blocks cannot be performed prior to her upcoming shoulder surgery she may need to be seen 1 to 2 months postoperatively to allow her to be comfortable on the table in a prone position  If the blocks are not particularly helpful for her buttocks pain.  She may benefit from ultrasound-guided left ischial bursa injection that can be performed by Dr. Delilah Shan

## 2019-03-10 NOTE — Patient Instructions (Signed)
Sacroiliac Joint Dysfunction  Sacroiliac joint dysfunction is a condition that causes inflammation on one or both sides of the sacroiliac (SI) joint. The SI joint connects the lower part of the spine (sacrum) with the two upper portions of the pelvis (ilium). This condition causes deep aching or burning pain in the low back. In some cases, the pain may also spread into one or both buttocks, hips, or thighs. What are the causes? This condition may be caused by:  Pregnancy. During pregnancy, extra stress is put on the SI joints because the pelvis widens.  Injury, such as: ? Injuries from car accidents. ? Sports-related injuries. ? Work-related injuries.  Having one leg that is shorter than the other.  Conditions that affect the joints, such as: ? Rheumatoid arthritis. ? Gout. ? Psoriatic arthritis. ? Joint infection (septic arthritis). Sometimes, the cause of SI joint dysfunction is not known. What are the signs or symptoms? Symptoms of this condition include:  Aching or burning pain in the lower back. The pain may also spread to other areas, such as: ? Buttocks. ? Groin. ? Thighs.  Muscle spasms in or around the painful areas.  Increased pain when standing, walking, running, stair climbing, bending, or lifting. How is this diagnosed? This condition is diagnosed with a physical exam and medical history. During the exam, the health care provider may move one or both of your legs to different positions to check for pain. Various tests may be done to confirm the diagnosis, including:  Imaging tests to look for other causes of pain. These may include: ? MRI. ? CT scan. ? Bone scan.  Diagnostic injection. A numbing medicine is injected into the SI joint using a needle. If your pain is temporarily improved or stopped after the injection, this can indicate that SI joint dysfunction is the problem. How is this treated? Treatment depends on the cause and severity of your condition.  Treatment options may include:  Ice or heat applied to the lower back area after an injury. This may help reduce pain and muscle spasms.  Medicines to relieve pain or inflammation or to relax the muscles.  Wearing a back brace (sacroiliac brace) to help support the joint while your back is healing.  Physical therapy to increase muscle strength around the joint and flexibility at the joint. This may also involve learning proper body positions and ways of moving to relieve stress on the joint.  Direct manipulation of the SI joint.  Injections of steroid medicine into the joint to reduce pain and swelling.  Radiofrequency ablation to burn away nerves that are carrying pain messages from the joint.  Use of a device that provides electrical stimulation to help reduce pain at the joint.  Surgery to put in screws and plates that limit or prevent joint motion. This is rare. Follow these instructions at home: Medicines  Take over-the-counter and prescription medicines only as told by your health care provider.  Do not drive or use heavy machinery while taking prescription pain medicine.  If you are taking prescription pain medicine, take actions to prevent or treat constipation. Your health care provider may recommend that you: ? Drink enough fluid to keep your urine pale yellow. ? Eat foods that are high in fiber, such as fresh fruits and vegetables, whole grains, and beans. ? Limit foods that are high in fat and processed sugars, such as fried or sweet foods. ? Take an over-the-counter or prescription medicine for constipation. If you have a brace:    Wear the brace as told by your health care provider. Remove it only as told by your health care provider.  Keep the brace clean.  If the brace is not waterproof: ? Do not let it get wet. ? Cover it with a watertight covering when you take a bath or a shower. Managing pain, stiffness, and swelling      Icing can help with pain and  swelling. Heat may help with muscle tension or spasms. Ask your health care provider if you should use ice or heat.  If directed, put ice on the affected area: ? If you have a removable brace, remove it as told by your health care provider. ? Put ice in a plastic bag. ? Place a towel between your skin and the bag. ? Leave the ice on for 20 minutes, 2-3 times a day.  If directed, apply heat to the affected area. Use the heat source that your health care provider recommends, such as a moist heat pack or a heating pad. ? Place a towel between your skin and the heat source. ? Leave the heat on for 20-30 minutes. ? Remove the heat if your skin turns bright red. This is especially important if you are unable to feel pain, heat, or cold. You may have a greater risk of getting burned. General instructions  Rest as needed. Ask your health care provider what activities are safe for you.  Return to your normal activities as told by your health care provider.  Exercise as directed by your health care provider or physical therapist.  Do not use any products that contain nicotine or tobacco, such as cigarettes and e-cigarettes. These can delay bone healing. If you need help quitting, ask your health care provider.  Keep all follow-up visits as told by your health care provider. This is important. Contact a health care provider if:  Your pain is not controlled with medicine.  You have a fever.  Your pain is getting worse. Get help right away if:  You have weakness, numbness, or tingling in your legs or feet.  You lose control of your bladder or bowel. Summary  Sacroiliac joint dysfunction is a condition that causes inflammation on one or both sides of the sacroiliac (SI) joint.  This condition causes deep aching or burning pain in the low back. In some cases, the pain may also spread into one or both buttocks, hips, or thighs.  Treatment depends on the cause and severity of your condition.  It may include medicines to reduce pain and swelling or to relax muscles. This information is not intended to replace advice given to you by your health care provider. Make sure you discuss any questions you have with your health care provider. Document Released: 09/19/2008 Document Revised: 02/17/2018 Document Reviewed: 08/03/2017 Elsevier Patient Education  2020 [employer]  

## 2019-03-11 NOTE — Patient Instructions (Addendum)
DUE TO COVID-19 ONLY ONE VISITOR IS ALLOWED TO COME WITH YOU AND STAY IN THE WAITING ROOM ONLY DURING PRE OP AND PROCEDURE DAY OF SURGERY.  THE 1 VISITOR MAY VISIT WITH YOU AFTER SURGERY IN YOUR PRIVATE ROOM DURING VISITING HOURS ONLY!  YOU NEED TO HAVE A COVID 19 TEST ON__9/14_____ @_11 :40______, THIS TEST MUST BE DONE BEFORE SURGERY, COME  [address], [place] , [address].  ([place])  ONCE YOUR COVID TEST IS COMPLETED, PLEASE BEGIN THE QUARANTINE INSTRUCTIONS AS OUTLINED IN YOUR HANDOUT.                Acquanetta Belling    Your procedure is scheduled on: Thurs. 03/24/19   Report to [facility] Main  Entrance   Report to admitting at  5:30 AM     Call this number if you have problems the morning of surgery [phone]    . BRUSH YOUR TEETH MORNING OF SURGERY AND RINSE YOUR MOUTH OUT, NO CHEWING GUM CANDY OR MINTS.    Do not eat food After Midnight.   YOU MAY HAVE CLEAR LIQUIDS FROM MIDNIGHT UNTIL 4:30AM.  At 4:30AM Please finish the prescribed Pre-Surgery  drink . Nothing by mouth after you finish the  drink !   Take these medicines the morning of surgery with A SIP OF WATER:  Lyrica,Cymbalta, Verapamil                                 You may not have any metal on your body including hair pins and              piercings              Do not wear jewelry, make-up, lotions, powders or perfumes, deodorant             Do not wear nail polish.  Do not shave  48 hours prior to surgery.           Do not bring valuables to the hospital. [place].  Contacts, dentures or bridgework may not be worn into surgery.      Patients discharged the day of surgery will not be allowed to drive home.  IF YOU ARE HAVING SURGERY AND GOING HOME THE SAME DAY, YOU MUST HAVE AN ADULT TO DRIVE YOU HOME AND BE WITH YOU FOR 24 HOURS.  YOU MAY GO HOME BY TAXI OR UBER OR ORTHERWISE, BUT AN ADULT MUST ACCOMPANY YOU HOME  AND STAY WITH YOU FOR 24 HOURS.  Name and phone number of your driver:  Special Instructions: N/A              Please read over the following fact sheets you were given: _____________________________________________________________________             [facility] - Preparing for Surgery  Before surgery, you can play an important role .  Because skin is not sterile, your skin needs to be as free of germs as possible.   You can reduce the number of germs on your skin by washing with CHG (chlorahexidine gluconate) soap before surgery.   CHG is an antiseptic cleaner which kills germs and bonds with the skin to continue killing germs even after washing. Please DO NOT use if you have an allergy to  CHG or antibacterial soaps.   If your skin becomes reddened/irritated stop using the CHG and inform your nurse when you arrive at Short Stay. Do not shave (including legs and underarms) for at least 48 hours prior to the first CHG shower.  . Please follow these instructions carefully:   1.  Shower with CHG Soap the night before surgery and the  morning of Surgery.  2.  If you choose to wash your hair, wash your hair first as usual with your  normal  shampoo.  3.  After you shampoo, rinse your hair and body thoroughly to remove the  shampoo.                                        4.  Use CHG as you would any other liquid soap.  You can apply chg directly  to the skin and wash                       Gently with a scrungie or clean washcloth.  5.  Apply the CHG Soap to your body ONLY FROM THE NECK DOWN.   Do not use on face/ open                           Wound or open sores. Avoid contact with eyes, ears mouth and genitals (private parts).                       Wash face,  Genitals (private parts) with your normal soap.             6.  Wash thoroughly, paying special attention to the area where your surgery  will be performed.  7.  Thoroughly rinse your body with warm water from the neck down.  8.  DO  NOT shower/wash with your normal soap after using and rinsing off  the CHG Soap.                9.  Pat yourself dry with a clean towel.            10.  Wear clean pajamas.            11.  Place clean sheets on your bed the night of your first shower and do not  sleep with pets. Day of Surgery : Do not apply any lotions/deodorants the morning of surgery.  Please wear clean clothes to the hospital/surgery center.  FAILURE TO FOLLOW THESE INSTRUCTIONS MAY RESULT IN THE CANCELLATION OF YOUR SURGERY PATIENT SIGNATURE_________________________________  NURSE SIGNATURE__________________________________  ________________________________________________________________________   Adam Phenix  An incentive spirometer is a tool that can help keep your lungs clear and active. This tool measures how well you are filling your lungs with each breath. Taking long deep breaths may help reverse or decrease the chance of developing breathing (pulmonary) problems (especially infection) following:  A long period of time when you are unable to move or be active. BEFORE THE PROCEDURE   If the spirometer includes an indicator to show your best effort, your nurse or respiratory therapist will set it to a desired goal.  If possible, sit up straight or lean slightly forward. Try not to slouch.  Hold the incentive spirometer in an upright position. INSTRUCTIONS FOR USE  1. Sit on the edge of your  bed if possible, or sit up as far as you can in bed or on a chair. 2. Hold the incentive spirometer in an upright position. 3. Breathe out normally. 4. Place the mouthpiece in your mouth and seal your lips tightly around it. 5. Breathe in slowly and as deeply as possible, raising the piston or the ball toward the top of the column. 6. Hold your breath for 3-5 seconds or for as long as possible. Allow the piston or ball to fall to the bottom of the column. 7. Remove the mouthpiece from your mouth and breathe  out normally. 8. Rest for a few seconds and repeat Steps 1 through 7 at least 10 times every 1-2 hours when you are awake. Take your time and take a few normal breaths between deep breaths. 9. The spirometer may include an indicator to show your best effort. Use the indicator as a goal to work toward during each repetition. 10. After each set of 10 deep breaths, practice coughing to be sure your lungs are clear. If you have an incision (the cut made at the time of surgery), support your incision when coughing by placing a pillow or rolled up towels firmly against it. Once you are able to get out of bed, walk around indoors and cough well. You may stop using the incentive spirometer when instructed by your caregiver.  RISKS AND COMPLICATIONS  Take your time so you do not get dizzy or light-headed.  If you are in pain, you may need to take or ask for pain medication before doing incentive spirometry. It is harder to take a deep breath if you are having pain. AFTER USE  Rest and breathe slowly and easily.  It can be helpful to keep track of a log of your progress. Your caregiver can provide you with a simple table to help with this. If you are using the spirometer at home, follow these instructions: [place] IF:   You are having difficultly using the spirometer.  You have trouble using the spirometer as often as instructed.  Your pain medication is not giving enough relief while using the spirometer.  You develop fever of 100.5 F (38.1 C) or higher. SEEK IMMEDIATE MEDICAL CARE IF:   You cough up bloody sputum that had not been present before.  You develop fever of 102 F (38.9 C) or greater.  You develop worsening pain at or near the incision site. MAKE SURE YOU:   Understand these instructions.  Will watch your condition.  Will get help right away if you are not doing well or get worse. Document Released: 11/03/2006 Document Revised: 09/15/2011 Document Reviewed:  01/04/2007 [facility] Patient Information 2014 [place], [place].   ________________________________________________________________________

## 2019-03-15 ENCOUNTER — Encounter (HOSPITAL_COMMUNITY): Payer: Self-pay

## 2019-03-15 ENCOUNTER — Other Ambulatory Visit: Payer: Self-pay | Admitting: *Deleted

## 2019-03-15 ENCOUNTER — Encounter (HOSPITAL_COMMUNITY)
Admission: RE | Admit: 2019-03-15 | Discharge: 2019-03-15 | Disposition: A | Discharge status: Home / Self Care | Payer: 59 | Source: Ambulatory Visit | Attending: Orthopedic Surgery | Admitting: Orthopedic Surgery

## 2019-03-15 ENCOUNTER — Other Ambulatory Visit: Payer: Self-pay

## 2019-03-15 DIAGNOSIS — Z01812 Encounter for preprocedural laboratory examination: Secondary | ICD-10-CM | POA: Insufficient documentation

## 2019-03-15 DIAGNOSIS — M13812 Other specified arthritis, left shoulder: Secondary | ICD-10-CM | POA: Insufficient documentation

## 2019-03-15 LAB — CBC
HCT: 39.2 % (ref 36.0–46.0)
Hemoglobin: 12.6 g/dL (ref 12.0–15.0)
MCH: 29.2 pg (ref 26.0–34.0)
MCHC: 32.1 g/dL (ref 30.0–36.0)
MCV: 90.7 fL (ref 80.0–100.0)
Platelets: 202 10*3/uL (ref 150–400)
RBC: 4.32 MIL/uL (ref 3.87–5.11)
RDW: 14.4 % (ref 11.5–15.5)
WBC: 6 10*3/uL (ref 4.0–10.5)
nRBC: 1.5 % — ABNORMAL HIGH (ref 0.0–0.2)

## 2019-03-15 LAB — BASIC METABOLIC PANEL
Anion gap: 8 (ref 5–15)
BUN: 15 mg/dL (ref 6–20)
CO2: 29 mmol/L (ref 22–32)
Calcium: 8.9 mg/dL (ref 8.9–10.3)
Chloride: 105 mmol/L (ref 98–111)
Creatinine, Ser: 0.7 mg/dL (ref 0.44–1.00)
GFR calc Af Amer: >60 mL/min (ref >60)
GFR calc non Af Amer: >60 mL/min (ref >60)
Glucose, Bld: 128 mg/dL — ABNORMAL HIGH (ref 70–99)
Potassium: 3.8 mmol/L (ref 3.5–5.1)
Sodium: 142 mmol/L (ref 135–145)

## 2019-03-15 LAB — SURGICAL PCR SCREEN
MRSA, PCR: NEGATIVE
Staphylococcus aureus: NEGATIVE

## 2019-03-15 MED ORDER — CLONAZEPAM 0.5 MG PO TABS: 0.5000 mg | ORAL_TABLET | Freq: Two times a day (BID) | ORAL | 0 refills | Status: DC | PRN

## 2019-03-15 NOTE — Progress Notes (Signed)
PCP - Dr. Pauline Good Cardiologist - none  Chest x-ray -no  EKG - no Stress Test - no ECHO - no Cardiac Cath - no  Sleep Study - yws CPAP - no  Fasting Blood Sugar - NA Checks Blood Sugar _____ times a day  Blood Thinner Instructions:NA Aspirin Instructions: Last Dose:  Anesthesia review:   Patient denies shortness of breath, fever, cough and chest pain at PAT appointment yes  Patient verbalized understanding of instructions that were given to them at the PAT appointment. Patient was also instructed that they will need to review over the PAT instructions again at home before surgery. yes

## 2019-03-21 ENCOUNTER — Other Ambulatory Visit (HOSPITAL_COMMUNITY)
Admission: RE | Admit: 2019-03-21 | Discharge: 2019-03-21 | Disposition: A | Discharge status: Home / Self Care | Payer: 59 | Source: Ambulatory Visit | Attending: Orthopedic Surgery | Admitting: Orthopedic Surgery

## 2019-03-21 DIAGNOSIS — Z01812 Encounter for preprocedural laboratory examination: Secondary | ICD-10-CM | POA: Insufficient documentation

## 2019-03-21 DIAGNOSIS — Z20828 Contact with and (suspected) exposure to other viral communicable diseases: Secondary | ICD-10-CM | POA: Diagnosis not present

## 2019-03-22 LAB — NOVEL CORONAVIRUS, NAA (HOSP ORDER, SEND-OUT TO REF LAB; TAT 18-24 HRS): SARS-CoV-2, NAA: NOT DETECTED

## 2019-03-23 NOTE — Anesthesia Preprocedure Evaluation (Addendum)
Anesthesia Evaluation  Patient identified by MRN, date of birth, ID band Patient awake    Reviewed: Allergy & Precautions, NPO status , Patient's Chart, lab work & pertinent test results  History of Anesthesia Complications (+) PONV  Airway Mallampati: III  TM Distance: >3 FB Neck ROM: Full    Dental  (+) Dental Advisory Given   Pulmonary neg pulmonary ROS,    breath sounds clear to auscultation       Cardiovascular negative cardio ROS   Rhythm:Regular Rate:Normal     Neuro/Psych  Headaches,  Neuromuscular disease    GI/Hepatic Neg liver ROS, GERD  ,  Endo/Other  Morbid obesity  Renal/GU negative Renal ROS     Musculoskeletal  (+) Arthritis ,   Abdominal   Peds  Hematology   Anesthesia Other Findings   Reproductive/Obstetrics                            Lab Results  Component Value Date   WBC 6.0 03/15/2019   HGB 12.6 03/15/2019   HCT 39.2 03/15/2019   MCV 90.7 03/15/2019   PLT 202 03/15/2019   Lab Results  Component Value Date   CREATININE 0.70 03/15/2019   BUN 15 03/15/2019   NA 142 03/15/2019   K 3.8 03/15/2019   CL 105 03/15/2019   CO2 29 03/15/2019    Anesthesia Physical Anesthesia Plan  ASA: III  Anesthesia Plan: General   Post-op Pain Management:  Regional for Post-op pain   Induction: Intravenous  PONV Risk Score and Plan: 4 or greater and Dexamethasone, Ondansetron, Treatment may vary due to age or medical condition, Scopolamine patch - Pre-op and Midazolam  Airway Management Planned: Oral ETT  Additional Equipment:   Intra-op Plan:   Post-operative Plan: Extubation in OR  Informed Consent: I have reviewed the patients History and Physical, chart, labs and discussed the procedure including the risks, benefits and alternatives for the proposed anesthesia with the patient or authorized representative who has indicated his/her understanding and acceptance.      Dental advisory given  Plan Discussed with: CRNA  Anesthesia Plan Comments:        Anesthesia Quick Evaluation

## 2019-03-24 ENCOUNTER — Other Ambulatory Visit: Payer: Self-pay

## 2019-03-24 ENCOUNTER — Ambulatory Visit (HOSPITAL_COMMUNITY): Payer: 59 | Admitting: Physician Assistant

## 2019-03-24 ENCOUNTER — Encounter (HOSPITAL_COMMUNITY): Payer: Self-pay | Admitting: *Deleted

## 2019-03-24 ENCOUNTER — Ambulatory Visit (HOSPITAL_COMMUNITY)
Admission: RE | Admit: 2019-03-24 | Discharge: 2019-03-25 | Disposition: A | Discharge status: Home / Self Care | Payer: 59 | Source: Other Acute Inpatient Hospital | Attending: Orthopedic Surgery | Admitting: Orthopedic Surgery

## 2019-03-24 ENCOUNTER — Ambulatory Visit (HOSPITAL_COMMUNITY): Payer: 59 | Admitting: Certified Registered"

## 2019-03-24 ENCOUNTER — Encounter (HOSPITAL_COMMUNITY)
Admission: RE | Disposition: A | Discharge status: Home / Self Care | Payer: Self-pay | Source: Other Acute Inpatient Hospital | Attending: Orthopedic Surgery

## 2019-03-24 DIAGNOSIS — F329 Major depressive disorder, single episode, unspecified: Secondary | ICD-10-CM | POA: Diagnosis not present

## 2019-03-24 DIAGNOSIS — M19012 Primary osteoarthritis, left shoulder: Secondary | ICD-10-CM | POA: Insufficient documentation

## 2019-03-24 DIAGNOSIS — Z6841 Body Mass Index (BMI) 40.0 and over, adult: Secondary | ICD-10-CM | POA: Diagnosis not present

## 2019-03-24 DIAGNOSIS — Z96612 Presence of left artificial shoulder joint: Secondary | ICD-10-CM

## 2019-03-24 DIAGNOSIS — K219 Gastro-esophageal reflux disease without esophagitis: Secondary | ICD-10-CM | POA: Insufficient documentation

## 2019-03-24 DIAGNOSIS — G35 Multiple sclerosis: Secondary | ICD-10-CM | POA: Diagnosis not present

## 2019-03-24 DIAGNOSIS — F419 Anxiety disorder, unspecified: Secondary | ICD-10-CM | POA: Insufficient documentation

## 2019-03-24 DIAGNOSIS — Z79899 Other long term (current) drug therapy: Secondary | ICD-10-CM | POA: Diagnosis not present

## 2019-03-24 HISTORY — PX: TOTAL SHOULDER ARTHROPLASTY: SHX126

## 2019-03-24 SURGERY — ARTHROPLASTY, SHOULDER, TOTAL
Anesthesia: General | Site: Shoulder | Laterality: Left

## 2019-03-24 MED ORDER — DIPHENHYDRAMINE HCL 12.5 MG/5ML PO ELIX
12.5000 mg | ORAL_SOLUTION | ORAL | Status: DC | PRN
  Administered 2019-03-24: 25 mg via ORAL
  Filled 2019-03-24: qty 10

## 2019-03-24 MED ORDER — LIDOCAINE 2% (20 MG/ML) 5 ML SYRINGE
INTRAMUSCULAR | Status: DC | PRN
  Administered 2019-03-24: 60 mg via INTRAVENOUS

## 2019-03-24 MED ORDER — PHENOL 1.4 % MT LIQD: 1.0000 | OROMUCOSAL | Status: DC | PRN

## 2019-03-24 MED ORDER — CYCLOBENZAPRINE HCL 10 MG PO TABS
10.0000 mg | ORAL_TABLET | Freq: Three times a day (TID) | ORAL | Status: DC | PRN
  Administered 2019-03-24 (×2): 10 mg via ORAL
  Filled 2019-03-24 (×2): qty 1

## 2019-03-24 MED ORDER — DEXAMETHASONE SODIUM PHOSPHATE 10 MG/ML IJ SOLN
INTRAMUSCULAR | Status: AC
  Filled 2019-03-24: qty 1

## 2019-03-24 MED ORDER — FENTANYL CITRATE (PF) 100 MCG/2ML IJ SOLN: 25.0000 ug | INTRAMUSCULAR | Status: DC | PRN

## 2019-03-24 MED ORDER — TRANEXAMIC ACID-NACL 1000-0.7 MG/100ML-% IV SOLN
1000.0000 mg | INTRAVENOUS | Status: AC
  Administered 2019-03-24: 08:00:00 1000 mg via INTRAVENOUS
  Filled 2019-03-24: qty 100

## 2019-03-24 MED ORDER — METOCLOPRAMIDE HCL 5 MG/ML IJ SOLN: 5.0000 mg | Freq: Three times a day (TID) | INTRAMUSCULAR | Status: DC | PRN

## 2019-03-24 MED ORDER — LACTATED RINGERS IV SOLN
INTRAVENOUS | Status: DC
  Administered 2019-03-24: 06:00:00 via INTRAVENOUS

## 2019-03-24 MED ORDER — ONDANSETRON HCL 4 MG PO TABS: 4.0000 mg | ORAL_TABLET | Freq: Four times a day (QID) | ORAL | Status: DC | PRN

## 2019-03-24 MED ORDER — BUPIVACAINE LIPOSOME 1.3 % IJ SUSP
INTRAMUSCULAR | Status: DC | PRN
  Administered 2019-03-24: 10 mL via PERINEURAL

## 2019-03-24 MED ORDER — POLYETHYLENE GLYCOL 3350 17 G PO PACK: 17.0000 g | PACK | Freq: Every day | ORAL | Status: DC | PRN

## 2019-03-24 MED ORDER — BUPIVACAINE-EPINEPHRINE (PF) 0.5% -1:200000 IJ SOLN
INTRAMUSCULAR | Status: DC | PRN
  Administered 2019-03-24: 15 mL

## 2019-03-24 MED ORDER — SUCCINYLCHOLINE CHLORIDE 200 MG/10ML IV SOSY
PREFILLED_SYRINGE | INTRAVENOUS | Status: DC | PRN
  Administered 2019-03-24: 100 mg via INTRAVENOUS

## 2019-03-24 MED ORDER — ONDANSETRON HCL 4 MG/2ML IJ SOLN: 4.0000 mg | Freq: Four times a day (QID) | INTRAMUSCULAR | Status: DC | PRN

## 2019-03-24 MED ORDER — ONDANSETRON HCL 4 MG/2ML IJ SOLN
INTRAMUSCULAR | Status: DC | PRN
  Administered 2019-03-24: 4 mg via INTRAVENOUS

## 2019-03-24 MED ORDER — MIDAZOLAM HCL 2 MG/2ML IJ SOLN
INTRAMUSCULAR | Status: DC | PRN
  Administered 2019-03-24 (×2): 1 mg via INTRAVENOUS

## 2019-03-24 MED ORDER — CLINDAMYCIN PHOSPHATE 900 MG/50ML IV SOLN
900.0000 mg | INTRAVENOUS | Status: AC
  Administered 2019-03-24: 08:00:00 900 mg via INTRAVENOUS
  Filled 2019-03-24: qty 50

## 2019-03-24 MED ORDER — DULOXETINE HCL 60 MG PO CPEP
120.0000 mg | ORAL_CAPSULE | Freq: Two times a day (BID) | ORAL | Status: DC
  Administered 2019-03-24 – 2019-03-25 (×2): 120 mg via ORAL
  Filled 2019-03-24 (×2): qty 2

## 2019-03-24 MED ORDER — LACTATED RINGERS IV SOLN
INTRAVENOUS | Status: DC
  Administered 2019-03-24 – 2019-03-25 (×2): via INTRAVENOUS

## 2019-03-24 MED ORDER — SUGAMMADEX SODIUM 200 MG/2ML IV SOLN
INTRAVENOUS | Status: DC | PRN
  Administered 2019-03-24: 200 mg via INTRAVENOUS

## 2019-03-24 MED ORDER — MIDAZOLAM HCL 2 MG/2ML IJ SOLN
INTRAMUSCULAR | Status: AC
  Filled 2019-03-24: qty 2

## 2019-03-24 MED ORDER — DOCUSATE SODIUM 100 MG PO CAPS
100.0000 mg | ORAL_CAPSULE | Freq: Two times a day (BID) | ORAL | Status: DC
  Administered 2019-03-24 – 2019-03-25 (×2): 100 mg via ORAL
  Filled 2019-03-24 (×2): qty 1

## 2019-03-24 MED ORDER — ONDANSETRON HCL 4 MG/2ML IJ SOLN
INTRAMUSCULAR | Status: AC
  Filled 2019-03-24: qty 2

## 2019-03-24 MED ORDER — PHENYLEPHRINE 40 MCG/ML (10ML) SYRINGE FOR IV PUSH (FOR BLOOD PRESSURE SUPPORT)
PREFILLED_SYRINGE | INTRAVENOUS | Status: AC
  Filled 2019-03-24: qty 10

## 2019-03-24 MED ORDER — ZOLPIDEM TARTRATE 5 MG PO TABS
5.0000 mg | ORAL_TABLET | Freq: Every evening | ORAL | Status: DC | PRN
  Administered 2019-03-25: 5 mg via ORAL
  Filled 2019-03-24: qty 1

## 2019-03-24 MED ORDER — SCOPOLAMINE 1 MG/3DAYS TD PT72
MEDICATED_PATCH | TRANSDERMAL | Status: AC
  Filled 2019-03-24: qty 1

## 2019-03-24 MED ORDER — ROCURONIUM BROMIDE 10 MG/ML (PF) SYRINGE
PREFILLED_SYRINGE | INTRAVENOUS | Status: DC | PRN
  Administered 2019-03-24: 20 mg via INTRAVENOUS
  Administered 2019-03-24: 40 mg via INTRAVENOUS
  Administered 2019-03-24: 10 mg via INTRAVENOUS

## 2019-03-24 MED ORDER — PHENYLEPHRINE 40 MCG/ML (10ML) SYRINGE FOR IV PUSH (FOR BLOOD PRESSURE SUPPORT)
PREFILLED_SYRINGE | INTRAVENOUS | Status: DC | PRN
  Administered 2019-03-24 (×2): 40 ug via INTRAVENOUS

## 2019-03-24 MED ORDER — ONDANSETRON HCL 4 MG/2ML IJ SOLN: 4.0000 mg | Freq: Once | INTRAMUSCULAR | Status: DC | PRN

## 2019-03-24 MED ORDER — PHENYLEPHRINE HCL (PRESSORS) 10 MG/ML IV SOLN
INTRAVENOUS | Status: AC
  Filled 2019-03-24: qty 1

## 2019-03-24 MED ORDER — ALUM & MAG HYDROXIDE-SIMETH 200-200-20 MG/5ML PO SUSP: 30.0000 mL | ORAL | Status: DC | PRN

## 2019-03-24 MED ORDER — EPHEDRINE 5 MG/ML INJ
INTRAVENOUS | Status: AC
  Filled 2019-03-24: qty 10

## 2019-03-24 MED ORDER — SCOPOLAMINE 1 MG/3DAYS TD PT72
MEDICATED_PATCH | TRANSDERMAL | Status: DC | PRN
  Administered 2019-03-24: 1 via TRANSDERMAL

## 2019-03-24 MED ORDER — HYDROMORPHONE HCL 2 MG PO TABS
2.0000 mg | ORAL_TABLET | ORAL | Status: DC | PRN
  Administered 2019-03-24 – 2019-03-25 (×4): 2 mg via ORAL
  Filled 2019-03-24 (×4): qty 1

## 2019-03-24 MED ORDER — 0.9 % SODIUM CHLORIDE (POUR BTL) OPTIME
TOPICAL | Status: DC | PRN
  Administered 2019-03-24: 1000 mL

## 2019-03-24 MED ORDER — BISACODYL 5 MG PO TBEC: 5.0000 mg | DELAYED_RELEASE_TABLET | Freq: Every day | ORAL | Status: DC | PRN

## 2019-03-24 MED ORDER — LIDOCAINE 2% (20 MG/ML) 5 ML SYRINGE
INTRAMUSCULAR | Status: AC
  Filled 2019-03-24: qty 5

## 2019-03-24 MED ORDER — FENTANYL CITRATE (PF) 250 MCG/5ML IJ SOLN
INTRAMUSCULAR | Status: DC | PRN
  Administered 2019-03-24 (×2): 50 ug via INTRAVENOUS

## 2019-03-24 MED ORDER — MENTHOL 3 MG MT LOZG: 1.0000 | LOZENGE | OROMUCOSAL | Status: DC | PRN

## 2019-03-24 MED ORDER — SUCCINYLCHOLINE CHLORIDE 200 MG/10ML IV SOSY
PREFILLED_SYRINGE | INTRAVENOUS | Status: AC
  Filled 2019-03-24: qty 20

## 2019-03-24 MED ORDER — HYDROMORPHONE HCL 1 MG/ML IJ SOLN: 0.5000 mg | INTRAMUSCULAR | Status: DC | PRN

## 2019-03-24 MED ORDER — CELECOXIB 200 MG PO CAPS
200.0000 mg | ORAL_CAPSULE | Freq: Two times a day (BID) | ORAL | Status: DC
  Administered 2019-03-24 – 2019-03-25 (×3): 200 mg via ORAL
  Filled 2019-03-24 (×3): qty 1

## 2019-03-24 MED ORDER — ACETAMINOPHEN 325 MG PO TABS: 325.0000 mg | ORAL_TABLET | Freq: Four times a day (QID) | ORAL | Status: DC | PRN

## 2019-03-24 MED ORDER — PANTOPRAZOLE SODIUM 40 MG PO TBEC
40.0000 mg | DELAYED_RELEASE_TABLET | Freq: Every day | ORAL | Status: DC
  Administered 2019-03-25: 40 mg via ORAL
  Filled 2019-03-24: qty 1

## 2019-03-24 MED ORDER — METOCLOPRAMIDE HCL 5 MG PO TABS: 5.0000 mg | ORAL_TABLET | Freq: Three times a day (TID) | ORAL | Status: DC | PRN

## 2019-03-24 MED ORDER — SODIUM CHLORIDE 0.9 % IV SOLN
INTRAVENOUS | Status: DC | PRN
  Administered 2019-03-24: 30 ug/min via INTRAVENOUS

## 2019-03-24 MED ORDER — FLEET ENEMA 7-19 GM/118ML RE ENEM: 1.0000 | ENEMA | Freq: Once | RECTAL | Status: DC | PRN

## 2019-03-24 MED ORDER — PROPOFOL 10 MG/ML IV BOLUS
INTRAVENOUS | Status: AC
  Filled 2019-03-24: qty 40

## 2019-03-24 MED ORDER — VERAPAMIL HCL ER 180 MG PO TBCR
180.0000 mg | EXTENDED_RELEASE_TABLET | Freq: Every day | ORAL | Status: DC
  Administered 2019-03-24: 180 mg via ORAL
  Filled 2019-03-24: qty 1

## 2019-03-24 MED ORDER — CLONAZEPAM 0.5 MG PO TABS
0.5000 mg | ORAL_TABLET | Freq: Two times a day (BID) | ORAL | Status: DC | PRN
  Administered 2019-03-24 (×2): 0.5 mg via ORAL
  Filled 2019-03-24 (×2): qty 1

## 2019-03-24 MED ORDER — DULOXETINE HCL 60 MG PO CPEP: 120.0000 mg | ORAL_CAPSULE | Freq: Two times a day (BID) | ORAL | Status: DC

## 2019-03-24 MED ORDER — HYDROCHLOROTHIAZIDE 12.5 MG PO CAPS
12.5000 mg | ORAL_CAPSULE | Freq: Every day | ORAL | Status: DC
  Administered 2019-03-25: 12.5 mg via ORAL
  Filled 2019-03-24: qty 1

## 2019-03-24 MED ORDER — PREGABALIN 75 MG PO CAPS: 150.0000 mg | ORAL_CAPSULE | Freq: Two times a day (BID) | ORAL | Status: DC

## 2019-03-24 MED ORDER — CHLORHEXIDINE GLUCONATE 4 % EX LIQD: 60.0000 mL | Freq: Once | CUTANEOUS | Status: DC

## 2019-03-24 MED ORDER — EPHEDRINE SULFATE-NACL 50-0.9 MG/10ML-% IV SOSY
PREFILLED_SYRINGE | INTRAVENOUS | Status: DC | PRN
  Administered 2019-03-24 (×2): 15 mg via INTRAVENOUS
  Administered 2019-03-24 (×2): 10 mg via INTRAVENOUS

## 2019-03-24 MED ORDER — PROPOFOL 10 MG/ML IV BOLUS
INTRAVENOUS | Status: DC | PRN
  Administered 2019-03-24: 160 mg via INTRAVENOUS

## 2019-03-24 MED ORDER — STERILE WATER FOR IRRIGATION IR SOLN
Status: DC | PRN
  Administered 2019-03-24: 2000 mL

## 2019-03-24 MED ORDER — ROCURONIUM BROMIDE 10 MG/ML (PF) SYRINGE
PREFILLED_SYRINGE | INTRAVENOUS | Status: AC
  Filled 2019-03-24: qty 10

## 2019-03-24 MED ORDER — ESOMEPRAZOLE MAGNESIUM 20 MG PO TBEC: 20.0000 mg | DELAYED_RELEASE_TABLET | Freq: Every day | ORAL | Status: DC

## 2019-03-24 MED ORDER — DEXAMETHASONE SODIUM PHOSPHATE 10 MG/ML IJ SOLN
INTRAMUSCULAR | Status: DC | PRN
  Administered 2019-03-24: 8 mg via INTRAVENOUS

## 2019-03-24 MED ORDER — PREGABALIN 75 MG PO CAPS
150.0000 mg | ORAL_CAPSULE | Freq: Two times a day (BID) | ORAL | Status: DC
  Administered 2019-03-24 – 2019-03-25 (×2): 150 mg via ORAL
  Filled 2019-03-24 (×2): qty 2

## 2019-03-24 MED ORDER — FENTANYL CITRATE (PF) 100 MCG/2ML IJ SOLN
INTRAMUSCULAR | Status: AC
  Filled 2019-03-24: qty 2

## 2019-03-24 SURGICAL SUPPLY — 66 items
BAG ZIPLOCK 12X15 (MISCELLANEOUS) ×3 IMPLANT
BIT DRILL 2.0X128 (BIT) ×2 IMPLANT
BIT DRILL 2.0X128MM (BIT) ×1
BLADE SAW SGTL 83.5X18.5 (BLADE) ×3 IMPLANT
CEMENT BONE DEPUY (Cement) ×3 IMPLANT
CHLORAPREP W/TINT 26 (MISCELLANEOUS) ×4 IMPLANT
COOLER ICEMAN CLASSIC (MISCELLANEOUS) IMPLANT
COVER BACK TABLE 60X90IN (DRAPES) ×3 IMPLANT
COVER SURGICAL LIGHT HANDLE (MISCELLANEOUS) ×3 IMPLANT
COVER WAND RF STERILE (DRAPES) IMPLANT
DERMABOND ADVANCED (GAUZE/BANDAGES/DRESSINGS) ×2
DERMABOND ADVANCED .7 DNX12 (GAUZE/BANDAGES/DRESSINGS) ×1 IMPLANT
DRAPE INCISE 23X17 IOBAN STRL (DRAPES) ×2
DRAPE INCISE 23X17 STRL (DRAPES) IMPLANT
DRAPE INCISE IOBAN 23X17 STRL (DRAPES) ×1 IMPLANT
DRAPE ORTHO SPLIT 77X108 STRL (DRAPES) ×4
DRAPE SHEET LG 3/4 BI-LAMINATE (DRAPES) ×3 IMPLANT
DRAPE SURG 17X11 SM STRL (DRAPES) ×3 IMPLANT
DRAPE SURG ORHT 6 SPLT 77X108 (DRAPES) ×2 IMPLANT
DRAPE U-SHAPE 47X51 STRL (DRAPES) ×3 IMPLANT
DRESSING AQUACEL AG SP 3.5X10 (GAUZE/BANDAGES/DRESSINGS) IMPLANT
DRSG AQUACEL AG ADV 3.5X10 (GAUZE/BANDAGES/DRESSINGS) ×3 IMPLANT
DRSG AQUACEL AG SP 3.5X10 (GAUZE/BANDAGES/DRESSINGS) ×3
ELECT BLADE TIP CTD 4 INCH (ELECTRODE) ×3 IMPLANT
ELECT REM PT RETURN 15FT ADLT (MISCELLANEOUS) ×3 IMPLANT
FACESHIELD WRAPAROUND (MASK) ×12 IMPLANT
FACESHIELD WRAPAROUND OR TEAM (MASK) ×4 IMPLANT
GLENOID WITH CLEAT MEDIUM (Shoulder) ×2 IMPLANT
GLOVE BIO SURGEON STRL SZ7.5 (GLOVE) ×3 IMPLANT
GLOVE BIO SURGEON STRL SZ8 (GLOVE) ×3 IMPLANT
GLOVE SS BIOGEL STRL SZ 7 (GLOVE) ×1 IMPLANT
GLOVE SS BIOGEL STRL SZ 7.5 (GLOVE) ×1 IMPLANT
GLOVE SUPERSENSE BIOGEL SZ 7 (GLOVE) ×2
GLOVE SUPERSENSE BIOGEL SZ 7.5 (GLOVE) ×2
GOWN STRL REUS W/TWL LRG LVL3 (GOWN DISPOSABLE) ×6 IMPLANT
HEAD HUMERAL UNIV 46/20 (Head) ×2 IMPLANT
KIT BASIN OR (CUSTOM PROCEDURE TRAY) ×3 IMPLANT
KIT TURNOVER KIT A (KITS) IMPLANT
MANIFOLD NEPTUNE II (INSTRUMENTS) ×3 IMPLANT
MARKER SKIN DUAL TIP RULER LAB (MISCELLANEOUS) ×3 IMPLANT
NDL TAPERED W/ NITINOL LOOP (MISCELLANEOUS) ×1 IMPLANT
NEEDLE TAPERED W/ NITINOL LOOP (MISCELLANEOUS) ×3 IMPLANT
NS IRRIG 1000ML POUR BTL (IV SOLUTION) ×3 IMPLANT
PACK SHOULDER (CUSTOM PROCEDURE TRAY) ×3 IMPLANT
PAD COLD SHLDR WRAP-ON (PAD) IMPLANT
PROTECTOR NERVE ULNAR (MISCELLANEOUS) ×3 IMPLANT
RESTRAINT HEAD UNIVERSAL NS (MISCELLANEOUS) ×3 IMPLANT
SLING ARM FOAM STRAP LRG (SOFTGOODS) ×2 IMPLANT
SLING ARM FOAM STRAP MED (SOFTGOODS) IMPLANT
SMARTMIX MINI TOWER (MISCELLANEOUS)
SPONGE LAP 18X18 RF (DISPOSABLE) IMPLANT
STEM APEX UNIVERSAL 6MM SHOULD (Stem) ×2 IMPLANT
SUCTION FRAZIER HANDLE 12FR (TUBING) ×2
SUCTION TUBE FRAZIER 12FR DISP (TUBING) ×1 IMPLANT
SUT FIBERWIRE #2 38 T-5 BLUE (SUTURE) ×3
SUT MNCRL AB 3-0 PS2 18 (SUTURE) ×3 IMPLANT
SUT MON AB 2-0 CT1 36 (SUTURE) ×3 IMPLANT
SUT VIC AB 1 CT1 36 (SUTURE) ×9 IMPLANT
SUTURE FIBERWR #2 38 T-5 BLUE (SUTURE) ×1 IMPLANT
SUTURE TAPE 1.3 40 TPR END (SUTURE) ×4 IMPLANT
SUTURETAPE 1.3 40 TPR END (SUTURE) ×12
TOWEL OR 17X26 10 PK STRL BLUE (TOWEL DISPOSABLE) ×3 IMPLANT
TOWEL OR NON WOVEN STRL DISP B (DISPOSABLE) ×3 IMPLANT
TOWER SMARTMIX MINI (MISCELLANEOUS) IMPLANT
WATER STERILE IRR 1000ML POUR (IV SOLUTION) ×6 IMPLANT
YANKAUER SUCT BULB TIP 10FT TU (MISCELLANEOUS) ×3 IMPLANT

## 2019-03-24 NOTE — H&P (Signed)
Joan Ray    Chief Complaint: left shoulder osteoarthritis HPI: The patient is a 58 y.o. female with end stage left shoulder OA  Past Medical History:  Diagnosis Date  . Anemia    h/o of   Last infusion of iron  2008  . Anxiety   . Arthritis    osteo   . Complication of anesthesia   . Constipation   . Depression    d/t decreased activity  . GERD (gastroesophageal reflux disease)   . Headache(784.0)    migraines  . High triglycerides   . MS (multiple sclerosis) ([place])   . 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 PONV (postoperative nausea and vomiting)   . Restless leg syndrome   . Wears glasses   . Wears glasses     Past Surgical History:  Procedure Laterality Date  . ANTERIOR CERVICAL DECOMP/DISCECTOMY FUSION N/A 12/21/2014   Procedure: ANTERIOR CERVICAL DECOMPRESSION/DISCECTOMY FUSION 2 LEVELS C5-7;  Surgeon: Melina Schools, MD;  Location: [facility];  Service: Orthopedics;  Laterality: N/A;  . BLADDER SUSPENSION     occas  blood in urine  . ENDOMETRIAL ABLATION  2010  . FOOT ARTHRODESIS Right 07/28/2013   Procedure: ARTHRODESIS RIGHT MIDFOOT OF THE FIRST -  THIRD TARSOMETATARSAL JOINTS ;  Surgeon: Wylene Simmer, MD;  Location: [facility];  Service: Orthopedics;  Laterality: Right;  . GASTROCNEMIUS RECESSION Right 07/28/2013   Procedure: RIGHT GASTROCNEMIUS RECESSION;  Surgeon: Wylene Simmer, MD;  Location: [facility];  Service: Orthopedics;  Laterality: Right;  . JOINT REPLACEMENT  2012   lt total knee  . KNEE ARTHROSCOPY     bilateral  in 2010, has multiple scopes  . MEDIAL PARTIAL KNEE REPLACEMENT     right  . nerve blocks    . SPINAL FUSION  05-05-2013  . stress fracture     right 2-3 toe  . TOTAL KNEE ARTHROPLASTY  2008   partial rt     Family History  Problem Relation Age of Onset  . Heart disease Mother   . Cancer Mother   . Hypertension Mother   . Depression Mother   . Heart disease Other   .  Hypertension Maternal Grandmother   . Diabetes Maternal Grandmother   . Kidney disease Maternal Grandmother   . Rheum arthritis Maternal Grandmother   . Depression Maternal Grandmother   . Hypertension Paternal Grandmother   . Kidney disease Maternal Grandfather   . Depression Maternal Grandfather   . Depression Brother     Social History:  reports that she has never smoked. She has never used smokeless tobacco. She reports current alcohol use. She reports that she does not use drugs.   Medications Prior to Admission  Medication Sig Dispense Refill  . acetaminophen (TYLENOL) 650 MG CR tablet Take 1,300 mg by mouth every 8 (eight) hours as needed for pain.    . celecoxib (CELEBREX) 200 MG capsule Take 1 capsule (200 mg total) by mouth 2 (two) times daily. BRAND NAME NECESSARY (Patient taking differently: Take 200 mg by mouth 2 (two) times daily. ) 180 capsule 3  . cetirizine (ZYRTEC) 10 MG tablet Take 10 mg by mouth daily.    . Cholecalciferol (VITAMIN D3) 125 MCG (5000 UT) CAPS Take 5,000 Units by mouth daily.     . clonazePAM (KLONOPIN) 0.5 MG tablet Take 1 tablet (0.5 mg total) by mouth 2 (two) times daily as needed. 180 tablet 0  . cyclobenzaprine (  FLEXERIL) 10 MG tablet take 1 tablet by mouth three times a day if needed (Patient taking differently: Take 10 mg by mouth 3 (three) times daily as needed for muscle spasms. ) 270 tablet 1  . DULoxetine (CYMBALTA) 60 MG capsule Take 2 capsules (120 mg total) by mouth daily. (Patient taking differently: Take 120 mg by mouth 2 (two) times daily. ) 180 capsule 1  . fluticasone (FLONASE) 50 MCG/ACT nasal spray Place 2 sprays into both nostrils at bedtime.     . furosemide (LASIX) 20 MG tablet Take 20 mg by mouth daily as needed for edema.    . hydrochlorothiazide (MICROZIDE) 12.5 MG capsule Take 12.5 mg by mouth daily.    Marland Kitchen HYDROmorphone (DILAUDID) 2 MG tablet Take 2 mg by mouth every 4 (four) hours as needed for severe pain.    Javier Docker Oil 500  MG CAPS Take 500 mg by mouth daily.    . Melatonin 10 MG CAPS Take 10 mg by mouth at bedtime.     . natalizumab (TYSABRI) 300 MG/15ML injection Inject 300 mg into the vein every 30 (thirty) days. Takes for MS    . pregabalin (LYRICA) 150 MG capsule Take 1 capsule (150 mg total) by mouth 2 (two) times daily. 180 capsule 3  . verapamil (CALAN-SR) 180 MG CR tablet Take 1 tablet (180 mg total) by mouth at bedtime. 90 tablet 1  . zaleplon (SONATA) 10 MG capsule Take 10 mg by mouth at bedtime.    . Esomeprazole Magnesium 20 MG TBEC Take 20 mg by mouth at bedtime.     . lidocaine (LIDODERM) 5 % apply 3 patches to affected area daily as directed (Patient not taking: Reported on 03/08/2019) 90 patch 6  . Olopatadine HCl 0.6 % SOLN Place 2 sprays into the nose daily.    . ondansetron (ZOFRAN) 4 MG tablet Take 4 mg by mouth every 8 (eight) hours as needed for nausea or vomiting.    . rizatriptan (MAXALT) 10 MG tablet Take 10 mg by mouth every 2 (two) hours as needed for migraine. May repeat in 2 hours if needed    . senna-docusate (SENOKOT-S) 8.6-50 MG tablet Take 1 tablet by mouth 2 (two) times daily.       Physical Exam: Left shoulder with painful and restricted motion as noted at recent office visits  X-ray confirms loss of joint space and subchondral sclerosis  Vitals  Temp:  [98.7 F (37.1 C)] 98.7 F (37.1 C) (09/17 0614) Pulse Rate:  [64] 64 (09/17 0614) Resp:  [17] 17 (09/17 0614) BP: (142)/(99) 142/99 (09/17 0614) SpO2:  [97 %] 97 % (09/17 0614) Weight:  [124.7 kg] 124.7 kg (09/17 0549)  Assessment/Plan  Impression: left shoulder osteoarthritis  Plan of Action: Procedure(s): TOTAL SHOULDER ARTHROPLASTY  Dortha Neighbors M Gael Londo 03/24/2019, 6:42 AM Contact # [phone]

## 2019-03-24 NOTE — Anesthesia Procedure Notes (Signed)
Anesthesia Regional Block: Interscalene brachial plexus block   Pre-Anesthetic Checklist: ,, timeout performed, Correct Patient, Correct Site, Correct Laterality, Correct Procedure, Correct Position, site marked, Risks and benefits discussed,  Surgical consent,  Pre-op evaluation,  At surgeon's request and post-op pain management  Laterality: Left  Prep: chloraprep       Needles:  Injection technique: Single-shot  Needle Type: Echogenic Stimulator Needle     Needle Length: 9cm  Needle Gauge: 21     Additional Needles:   Procedures:, nerve stimulator,,, ultrasound used (permanent image in chart),,,,   Nerve Stimulator or Paresthesia:  Response: deltoid and bicep, 0.5 mA,   Additional Responses:   Narrative:  Start time: 03/24/2019 7:07 AM End time: 03/24/2019 7:15 AM Injection made incrementally with aspirations every 5 mL.  Performed by: Personally  Anesthesiologist: Suzette Battiest, MD

## 2019-03-24 NOTE — Discharge Instructions (Signed)
° °Kevin M. Supple, M.D., F.A.A.O.S. °Orthopaedic Surgery °Specializing in Arthroscopic and Reconstructive °Surgery of the Shoulder °[phone] °[address] - [place], [place] [address] - Fax [phone] ° ° °POST-OP TOTAL SHOULDER REPLACEMENT INSTRUCTIONS ° °1. Call the office at [phone] to schedule your first post-op appointment 10-14 days from the date of your surgery. ° °2. The bandage over your incision is waterproof. You may begin showering with this dressing on. You may leave this dressing on until first follow up appointment within 2 weeks. We prefer you leave this dressing in place until follow up however after 5-7 days if you are having itching or skin irritation and would like to remove it you may do so. Go slow and tug at the borders gently to break the bond the dressing has with the skin. At this point if there is no drainage it is okay to go without a bandage or you may cover it with a light guaze and tape. You can also expect significant bruising around your shoulder that will drift down your arm and into your chest wall. This is very normal and should resolve over several days. ° ° 3. Wear your sling/immobilizer at all times except to perform the exercises below or to occasionally let your arm dangle by your side to stretch your elbow. You also need to sleep in your sling immobilizer until instructed otherwise. It is ok to remove your sling if you are sitting in a controlled environment and allow your arm to rest in a position of comfort by your side or on your lap with pillows to give your neck and skin a break from the sling. You may remove it to allow arm to dangle by side to shower. If you are up walking around and when you go to sleep at night you need to wear it. ° °4. Range of motion to your elbow, wrist, and hand are encouraged 3-5 times daily. Exercise to your hand and fingers helps to reduce swelling you may experience. ° °5. Utilize ice to the shoulder 3-5 times  minimum a day and additionally if you are experiencing pain. ° °6. Prescriptions for a pain medication and a muscle relaxant are provided for you. It is recommended that if you are experiencing pain that you pain medication alone is not controlling, add the muscle relaxant along with the pain medication which can give additional pain relief. The first 1-2 days is generally the most severe of your pain and then should gradually decrease. As your pain lessens it is recommended that you decrease your use of the pain medications to an "as needed basis'" only and to always comply with the recommended dosages of the pain medications. ° °7. Pain medications can produce constipation along with their use. If you experience this, the use of an over the counter stool softener or laxative daily is recommended.  ° °8. For additional questions or concerns, please do not hesitate to call the office. If after hours there is an answering service to forward your concerns to the physician on call. ° °9.Pain control following an exparel block ° °To help control your post-operative pain you received a nerve block  performed with Exparel which is a long acting anesthetic (numbing agent) which can provide pain relief and sensations of numbness (and relief of pain) in the operative shoulder and arm for up to 3 days. Sometimes it provides mixed relief, meaning you may still have numbness in certain areas of the arm but can still   be able to move  parts of that arm, hand, and fingers. We recommend that your prescribed pain medications  be used as needed. We do not feel it is necessary to "pre medicate" and "stay ahead" of pain.  Taking narcotic pain medications when you are not having any pain can lead to unnecessary and potentially dangerous side effects.   ° °10. Use the ice machine as much as possible in the first 5-7 days from surgery, then you can wean its use to as needed. The ice typically needs to be replaced every 6 hours, instead of  ice you can actually freeze water bottles to put in the cooler and then fill water around them to avoid having to purchase ice. You can have spare water bottles freezing to allow you to rotate them once they have melted. Try to have a thin shirt or light cloth or towel under the ice wrap to protect your skin. ° °POST-OP EXERCISES ° °Pendulum Exercises ° °Perform pendulum exercises while standing and bending at the waist. Support your uninvolved arm on a table or chair and allow your operated arm to hang freely. Make sure to do these exercises passively - not using you shoulder muscles. ° °Repeat 20 times. Do 3 sessions per day. ° ° ° ° °

## 2019-03-24 NOTE — Op Note (Signed)
03/24/2019  9:45 AM  PATIENT:   Joan Ray  58 y.o. female  PRE-OPERATIVE DIAGNOSIS:  left shoulder osteoarthritis  POST-OPERATIVE DIAGNOSIS: Same  PROCEDURE: Left anatomic total shoulder arthroplasty utilizing a press-fit size 6 Arthrex stem with a 46 x 21 eccentric head and a medium glenoid  SURGEON:  Tahnee Cifuentes, Metta Clines M.D.  ASSISTANTS: Jenetta Loges, PA-C  ANESTHESIA:   General endotracheal and interscalene block with Exparel  EBL: 300 cc  SPECIMEN: None  Drains: None   PATIENT DISPOSITION:  PACU - hemodynamically stable.    PLAN OF CARE: Admit for overnight observation  Brief history:  Patient is a 58 year old female who has had chronic and progressively increasing left shoulder pain related to end stage osteoarthritis.  Her clinical exam demonstrates severely restricted and painful motion but relatively good strength to manual muscle testing.  Her recent left shoulder MRI scan confirms the rotator cuff to be intact.  Due to her increasing pain and functional limitation she is brought to the operating room at this time for planned left total shoulder arthroplasty.  Preoperatively I counseled [name] regarding treatment options as well as the potential risks versus benefits thereof.  Possible surgical complications were reviewed including bleeding, infection, neurovascular injury, persistent pain, loss of motion, failure the implant, anesthetic complication, and possible need for additional surgery.  She understands, and accepts, and agrees with our planned procedure.  Procedure in detail:  After undergoing routine preop evaluation patient received prophylactic antibiotics and interscalene block with Exparel was established in the holding area by the anesthesia department.  She was then placed supine on the operative table underwent smooth duction of a general tracheal anesthesia.  Placed into the beachchair position and appropriately padded and protected.  The left  shoulder girdle region was sterilely prepped and draped in standard fashion.  Timeout was called.  An anterior deltopectoral approach left was made through 12 cm incision.  Skin flaps elevated dissection carried deeply the deltopectoral interval was developed from proximal to distal with a vein taken laterally in the upper centimeter and a half of the pectoralis major was tenotomized with hands exposure the conjoined tendon was mobilized retracted medially.  The long head bicep tendon was then unroofed it was tenodesed at the upper border the pectoralis major tendon and the proximal segment was then divided and excised.  We then divided the rotator cuff along the rotator interval towards the base of the coracoid and then identified the superior and inferior margins of the subscapularis insertion into the lesser tuberosity and once this was defined we used an oscillating saw to create a lesser tuberosity osteotomy and then carefully mobilized subscapularis removed the large osteophytes that were attached to that region and then tagged the subscap and reflected this medially.  We then divided the capsular attachments from the anterior and infra margins of the humeral neck delivering humeral head to the wound.  We then outlined our proposed humeral head resection using extra medullary guide and this was then completed using an oscillating saw with care taken to protect the rotator cuff superiorly and posteriorly.  Large osteophytes were then removed from the margins of the proximal humerus.  The canal was prepared and reaming and then broaching to size 6 which showed solid fixation.  A size 5 stem with metal cap was then placed into the humeral canal and at this point we turned our attention to the glenoid which was exposed using standard retractors and a circumferential labral resection was completed  gaining complete visualization the periphery of the glenoid gland glenoid was sized at a medium and a central guidepin  was then placed and the glenoid was reamed to a stable subchondral bone bed and then terminal preparation completed with a central drill hole in the guide used to create our superior and inferior peg and slot respectively and the glenoid was then broached with excellent fit and fixation of the trial.  The glenoid was then meticulously irrigated cleaned and dried cement was then mixed and introduced into the superior and inferior peg and slot respectively the central peg was impacted with morselized bone and the glenoid was then impacted with excellent fit fixation.  We then returned our attention to the proximal humerus where drill holes were placed to be used for our LTO repair.  Passing sutures were then shuttled through the drill holes and our stem was then threaded with the sutures to be used for the LTO repair and the stem was then introduced the LTO repair sutures were shuttled through the bone tunnels and then the humeral stem was impacted with excellent fit and fixation.  The proximal locking screws were then tightened.  At this point we performed a series of trial reductions and ultimately felt that the 46 x 21 eccentric head gave [place] the best soft tissue balance with approximately 50% translation of the humeral head of the glenoid.  The trial was removed the [facility] taper was cleaned and dried the final head was then impacted.  We confirmed appropriate removal of the osteophytes from the humeral neck region the joint was irrigated hemostasis was obtained and the shoulder was reduced again with excellent soft tissue balance much to our satisfaction.  At this point our lesser tuberosity osteotomy fragment was repaired back to the proximal humeral metaphysis utilizing our previously placed sutures and our standard construct with 2 parallel and 2 crossing sutures allowing excellent re-apposition and then the rotator interval was also repaired with a series of suture tape sutures.  At this point the arm easily  achieved 45 degrees of external rotation without excessive tension on the subscapularis repair.  The wound is again irrigated and hemostasis was obtained.  The deltopectoral interval was reapproximated with a series of figure-of-eight and 1 Vicryl sutures.  2-0 Vicryl used with subcu and intracuticular 3 Monocryl for the skin followed by Dermabond and Aquasol dressing.  Left arm was placed into a sling and the patient was awakened, extubated, and taken recovery in stable condition.  Jenetta Loges, PA-C was used as an Environmental consultant throughout this case essential for help with positioning the patient, positioning extremity, tissue manipulation, implantation of the prosthesis, wound closure, and intraoperative decision making.  Metta Clines Eirene Rather MD   Contact # [phone]

## 2019-03-24 NOTE — Transfer of Care (Signed)
Immediate Anesthesia Transfer of Care Note  Patient: LARON PIQUE  Procedure(s) Performed: TOTAL SHOULDER ARTHROPLASTY (Left Shoulder)  Patient Location: PACU  Anesthesia Type:General  Level of Consciousness: awake, alert  and oriented  Airway & Oxygen Therapy: Patient Spontanous Breathing and Patient connected to face mask oxygen  Post-op Assessment: Report given to RN and Post -op Vital signs reviewed and stable  Post vital signs: Reviewed and stable  Last Vitals:  Vitals Value Taken Time  BP 135/81 03/24/19 0956  Temp    Pulse 74 03/24/19 0957  Resp 13 03/24/19 0957  SpO2 100 % 03/24/19 0957  Vitals shown include unvalidated device data.  Last Pain:  Vitals:   03/24/19 0614  TempSrc: Oral      Patients Stated Pain Goal: 3 ([id number])  Complications: No apparent anesthesia complications

## 2019-03-24 NOTE — Anesthesia Postprocedure Evaluation (Signed)
Anesthesia Post Note  Patient: Joan Ray  Procedure(s) Performed: TOTAL SHOULDER ARTHROPLASTY (Left Shoulder)     Patient location during evaluation: PACU Anesthesia Type: General Level of consciousness: awake and alert Pain management: pain level controlled Vital Signs Assessment: post-procedure vital signs reviewed and stable Respiratory status: spontaneous breathing, nonlabored ventilation, respiratory function stable and patient connected to nasal cannula oxygen Cardiovascular status: blood pressure returned to baseline and stable Postop Assessment: no apparent nausea or vomiting Anesthetic complications: no    Last Vitals:  Vitals:   03/24/19 1322 03/24/19 1412  BP: 124/68 104/63  Pulse: 80 81  Resp: 16 14  Temp: 36.7 C 36.7 C  SpO2: 94% 95%    Last Pain:  Vitals:   03/24/19 1449  TempSrc:   PainSc: 0-No pain                 Tiajuana Amass

## 2019-03-24 NOTE — Anesthesia Procedure Notes (Signed)
Procedure Name: Intubation Date/Time: 03/24/2019 7:45 AM Performed by: Eben Burow, CRNA Pre-anesthesia Checklist: Patient identified, Emergency Drugs available, Suction available, Patient being monitored and Timeout performed Patient Re-evaluated:Patient Re-evaluated prior to induction Oxygen Delivery Method: Circle system utilized Preoxygenation: Pre-oxygenation with 100% oxygen Induction Type: IV induction and Rapid sequence Laryngoscope Size: Mac and 4 Grade View: Grade I Tube type: Oral Tube size: 7.0 mm Number of attempts: 1 Airway Equipment and Method: Stylet Placement Confirmation: ETT inserted through vocal cords under direct vision,  positive ETCO2 and breath sounds checked- equal and bilateral Secured at: 22 cm Tube secured with: Tape Dental Injury: Teeth and Oropharynx as per pre-operative assessment

## 2019-03-25 ENCOUNTER — Encounter (HOSPITAL_COMMUNITY): Payer: Self-pay | Admitting: Orthopedic Surgery

## 2019-03-25 DIAGNOSIS — M19012 Primary osteoarthritis, left shoulder: Secondary | ICD-10-CM | POA: Diagnosis not present

## 2019-03-25 MED ORDER — HYDROMORPHONE HCL 2 MG PO TABS: 2.0000 mg | ORAL_TABLET | ORAL | 0 refills | Status: DC | PRN

## 2019-03-25 NOTE — Plan of Care (Signed)
  Problem: Education: Goal: Knowledge of General Education information will improve Description: Including pain rating scale, medication(s)/side effects and non-pharmacologic comfort measures Outcome: Adequate for Discharge   Problem: Health Behavior/Discharge Planning: Goal: Ability to manage health-related needs will improve Outcome: Adequate for Discharge   Problem: Clinical Measurements: Goal: Ability to maintain clinical measurements within normal limits will improve Outcome: Adequate for Discharge Goal: Will remain free from infection Outcome: Adequate for Discharge Goal: Diagnostic test results will improve Outcome: Adequate for Discharge Goal: Respiratory complications will improve Outcome: Adequate for Discharge Goal: Cardiovascular complication will be avoided Outcome: Adequate for Discharge   Problem: Activity: Goal: Risk for activity intolerance will decrease Outcome: Adequate for Discharge   Problem: Nutrition: Goal: Adequate nutrition will be maintained Outcome: Adequate for Discharge   Problem: Coping: Goal: Level of anxiety will decrease Outcome: Adequate for Discharge   Problem: Elimination: Goal: Will not experience complications related to bowel motility Outcome: Adequate for Discharge Goal: Will not experience complications related to urinary retention Outcome: Adequate for Discharge   Problem: Pain Managment: Goal: General experience of comfort will improve Outcome: Adequate for Discharge   Problem: Safety: Goal: Ability to remain free from injury will improve Outcome: Adequate for Discharge   Problem: Skin Integrity: Goal: Risk for impaired skin integrity will decrease Outcome: Adequate for Discharge   Problem: Education: Goal: Knowledge of the prescribed therapeutic regimen will improve Outcome: Adequate for Discharge Goal: Understanding of activity limitations/precautions following surgery will improve Outcome: Adequate for  Discharge Goal: Individualized Educational Video(s) Outcome: Adequate for Discharge   Problem: Activity: Goal: Ability to tolerate increased activity will improve Outcome: Adequate for Discharge   Problem: Pain Management: Goal: Pain level will decrease with appropriate interventions Outcome: Adequate for Discharge   

## 2019-03-25 NOTE — Evaluation (Signed)
Occupational Therapy Evaluation and Discharge Patient Details Name: Joan Ray MRN: [id number] DOB: 1960-10-05 Today's Date: 03/25/2019    History of Present Illness Pt admitted for L total shoulder athroplasty.  Pt has PMH of R total shoulder and B TKR as well as back surgery.     Clinical Impression   Pt admitted for above diagnosis and overall is doing very well with all education for L shoulder and all adls.  Educational materials given to review adl techniques and pendulum exercises.  Pt does not need further acute OT at this time.  Pt will d/c home with her husband who is around 24/7 and has all necessary equipment.    Follow Up Recommendations  Follow surgeon's recommendation for DC plan and follow-up therapies;Supervision - Intermittent    Equipment Recommendations  None recommended by OT    Recommendations for Other Services       Precautions / Restrictions Precautions Precautions: Shoulder Type of Shoulder Precautions: No AROM outside of minimal use for adl assist only.  Pendulums only, NWB LUE Shoulder Interventions: Shoulder sling/immobilizer;At all times;Off for dressing/bathing/exercises Precaution Booklet Issued: Yes (comment) Precaution Comments: Pt tends to move L shoulder more than she should.  Talked to her at length about wearing the sling when not bathing/doing pendulums. Required Braces or Orthoses: Sling Restrictions Weight Bearing Restrictions: Yes LUE Weight Bearing: Non weight bearing      Mobility Bed Mobility Overal bed mobility: Modified Independent             General bed mobility comments: extra time and cues to wear sling  Transfers Overall transfer level: Modified independent                    Balance Overall balance assessment: Modified Independent                                         ADL either performed or assessed with clinical judgement   ADL Overall ADL's : Needs  assistance/impaired Eating/Feeding: Independent;Sitting   Grooming: Wash/dry hands;Wash/dry face;Oral care;Supervision/safety;Standing   Upper Body Bathing: Set up;Sitting   Lower Body Bathing: Set up;Sitting/lateral leans;Cueing for safety Lower Body Bathing Details (indicate cue type and reason): cues to wear sling when bathing so she does not overuse shoulder. Upper Body Dressing : Minimal assistance;Sitting;Cueing for compensatory techniques   Lower Body Dressing: Set up;Sitting/lateral leans   Toilet Transfer: Supervision/safety;Ambulation   Toileting- Clothing Manipulation and Hygiene: Supervision/safety;Sit to/from stand Toileting - Clothing Manipulation Details (indicate cue type and reason): occasional assist cleaning self after bowel movements     Functional mobility during ADLs: Supervision/safety General ADL Comments: Pt doing very well with adls. Pt familiar with routine from previous shoulder surgery.     Vision Baseline Vision/History: No visual deficits Patient Visual Report: No change from baseline Vision Assessment?: No apparent visual deficits     Perception Perception Perception Tested?: No   Praxis Praxis Praxis tested?: Not tested    Pertinent Vitals/Pain Pain Assessment: Faces Faces Pain Scale: Hurts a little bit Pain Location: L shoulder Pain Descriptors / Indicators: Aching;Sore;Operative site guarding Pain Intervention(s): Monitored during session;Repositioned;Premedicated before session     Hand Dominance Right   Extremity/Trunk Assessment Upper Extremity Assessment Upper Extremity Assessment: RUE deficits/detail;LUE deficits/detail RUE Deficits / Details: good ROM.  Slightly weak from R shoulder surgery RUE Coordination: WNL LUE Deficits / Details: AROM of elbow wrist  and hand WNL.  Shoulder not tested.  Pendulums completed. LUE: Unable to fully assess due to immobilization LUE Sensation: WNL LUE Coordination: decreased gross motor    Lower Extremity Assessment Lower Extremity Assessment: Overall WFL for tasks assessed   Cervical / Trunk Assessment Cervical / Trunk Assessment: Normal   Communication Communication Communication: No difficulties   Cognition Arousal/Alertness: Awake/alert Behavior During Therapy: WFL for tasks assessed/performed Overall Cognitive Status: Within Functional Limits for tasks assessed                                     General Comments  Pt has MS but balance intact.    Exercises Exercises: Shoulder Shoulder Exercises Pendulum Exercise: PROM;10 reps;Standing Elbow Flexion: 10 reps;Left Elbow Extension: Left;10 reps Wrist Flexion: Left;10 reps Wrist Extension: Left;10 reps Digit Composite Flexion: Left;10 reps Composite Extension: Left;10 reps   Shoulder Instructions Shoulder Instructions Donning/doffing shirt without moving shoulder: Minimal assistance Method for sponge bathing under operated UE: Independent Donning/doffing sling/immobilizer: Independent Correct positioning of sling/immobilizer: Independent Pendulum exercises (written home exercise program): Supervision/safety ROM for elbow, wrist and digits of operated UE: Independent Sling wearing schedule (on at all times/off for ADL's): Independent Proper positioning of operated UE when showering: Independent Positioning of UE while sleeping: [place] expects to be discharged to:: Private residence Living Arrangements: Spouse/significant other Available Help at Discharge: Family;Available 24 hours/day Type of Home: House Home Access: Stairs to enter [employer] of Steps: 4 Entrance Stairs-Rails: Right Home Layout: One level     Bathroom Shower/Tub: Occupational psychologist: Standard     Home Equipment: Environmental consultant - 2 wheels;Shower seat;Bedside commode   Additional Comments: has eqiupment from past surgeries.      Prior Functioning/Environment  Level of Independence: Independent with assistive device(s)    ADL's / Homemaking Assistance Needed: indepenedent PTA            OT Problem List:        OT Treatment/Interventions:      OT Goals(Current goals can be found in the care plan section) Acute Rehab OT Goals Patient Stated Goal: to get home today OT Goal Formulation: All assessment and education complete, DC therapy  OT Frequency:     Barriers to D/C:            Co-evaluation              AM-PAC OT "6 Clicks" Daily Activity     Outcome Measure Help from another person eating meals?: None Help from another person taking care of personal grooming?: None Help from another person toileting, which includes using toliet, bedpan, or urinal?: A Little Help from another person bathing (including washing, rinsing, drying)?: A Little Help from another person to put on and taking off regular upper body clothing?: A Little Help from another person to put on and taking off regular lower body clothing?: None 6 Click Score: 21   End of Session Nurse Communication: Mobility status  Activity Tolerance: No increased pain;Patient tolerated treatment well Patient left: in chair  OT Visit Diagnosis: Muscle weakness (generalized) (M62.81)                Time: [id number] OT Time Calculation (min): 38 min Charges:  OT General Charges $OT Visit: 1 Visit OT Evaluation $OT Eval Moderate Complexity: 1 Mod OT Treatments $Therapeutic Exercise: 8-22 mins  Jinger Neighbors, OTR/L [phone]  Glenford Peers 03/25/2019, 9:38 AM

## 2019-03-25 NOTE — Discharge Summary (Signed)
PATIENT ID:      Joan Ray  MRN:     [id number] DOB/AGE:    Dec 23, 1960 / 58 y.o.     DISCHARGE SUMMARY  ADMISSION DATE:    03/24/2019 DISCHARGE DATE:    ADMISSION DIAGNOSIS: left shoulder osteoarthritis Past Medical History:  Diagnosis Date  . Anemia    h/o of   Last infusion of iron  2008  . Anxiety   . Arthritis    osteo   . Complication of anesthesia   . Constipation   . Depression    d/t decreased activity  . GERD (gastroesophageal reflux disease)   . Headache(784.0)    migraines  . High triglycerides   . MS (multiple sclerosis) ([place])   . 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 PONV (postoperative nausea and vomiting)   . Restless leg syndrome   . Wears glasses   . Wears glasses     DISCHARGE DIAGNOSIS:   Active Problems:   Status post total shoulder arthroplasty, left   PROCEDURE: Procedure(s): TOTAL SHOULDER ARTHROPLASTY on 03/24/2019  CONSULTS:    HISTORY:  See H&P in chart.  HOSPITAL COURSE:  Joan Ray is a 58 y.o. admitted on 03/24/2019 with a diagnosis of left shoulder osteoarthritis.  They were brought to the operating room on 03/24/2019 and underwent Procedure(s): TOTAL SHOULDER ARTHROPLASTY.    They were given perioperative antibiotics:  Anti-infectives (From admission, onward)   Start     Dose/Rate Route Frequency Ordered Stop   03/24/19 0600  clindamycin (CLEOCIN) IVPB 900 mg     900 mg 100 mL/hr over 30 Minutes Intravenous On call to O.R. 03/24/19 [id number] 03/24/19 0747    .  Patient underwent the above named procedure and tolerated it well. The following day they were hemodynamically stable and pain was controlled on oral analgesics. They were neurovascularly intact to the operative extremity. OT was ordered and worked with patient per protocol. They were medically and orthopaedically stable for discharge on .    DIAGNOSTIC STUDIES:  RECENT RADIOGRAPHIC STUDIES :  No results found.  RECENT VITAL SIGNS:    Patient Vitals for the past 24 hrs:  BP Temp Temp src Pulse Resp SpO2  03/25/19 0530 129/80 98.2 F (36.8 C) Oral 94 18 94 %  03/25/19 0126 105/86 97.9 F (36.6 C) Oral 94 18 94 %  03/24/19 2132 (!) 149/77 - - 92 - -  03/24/19 2127 (!) 169/99 97.8 F (36.6 C) Oral 95 20 95 %  03/24/19 1836 - 98.1 F (36.7 C) Oral - - -  03/24/19 1412 104/63 98 F (36.7 C) Oral 81 14 95 %  03/24/19 1322 124/68 98 F (36.7 C) Oral 80 16 94 %  03/24/19 1231 (!) 106/52 98.3 F (36.8 C) Oral 82 15 93 %  03/24/19 1113 127/77 97.8 F (36.6 C) Oral 71 17 98 %  03/24/19 1100 133/75 (!) 97.5 F (36.4 C) - 73 18 97 %  03/24/19 1045 (!) 112/100 - - 82 (!) 25 95 %  03/24/19 1030 123/73 - - 68 (!) 21 97 %  03/24/19 1015 127/89 - - 73 18 99 %  03/24/19 1000 99/82 - - 76 20 100 %  03/24/19 0956 135/81 (!) 97.5 F (36.4 C) - 77 11 99 %  .  RECENT EKG RESULTS:    Orders placed or performed during the hospital encounter of 12/21/14  . EKG 12-Lead  . EKG 12-Lead    DISCHARGE  INSTRUCTIONS:    DISCHARGE MEDICATIONS:   Allergies as of 03/25/2019      Reactions   Bee Venom Swelling   Hydrocodone-acetaminophen Itching   Progesterone Other (See Comments), Rash   increase/profuse sweating-flushing-   Sertraline Hcl Other (See Comments)   Statins Swelling   muscle and joint pain/tightness   Trazodone Other (See Comments)   dizziness   Betadine [povidone Iodine]    Blisters   Codeine Itching   Rash   Darvocet [propoxyphene N-acetaminophen] Nausea And Vomiting   Diazepam    Increases agitation   Dilaudid [hydromorphone Hcl] Itching   Doxycycline    Facial flushing   Keppra [levetiracetam]    Shaking, nervousness, palpations and flushing   Lamictal [lamotrigine]    Causes tongue and mouth to tingle   Lipitor [atorvastatin]    Severe muscle and joint pain / ms relapse   Lyrica [pregabalin] Other (See Comments)   Doses over 300mg  decrease memory   Neurontin [gabapentin]    Impaired cognition,  slower motor skills (intolerant)   Other    EDTA causes blisters   Oxycodone Hcl Itching   Pravastatin Other (See Comments)   Severe joint pain MS relapse   Sertraline Other (See Comments)   Increased nervousness   Topamax [topiramate]    Confusion   Tramadol Other (See Comments)   "Nervous, jittery, felt weird, nausea"   Zonisamide    Back pain, headaches, insomnia, chest pressure   Cephalosporins Rash   Erythromycin Itching, Rash   Keflex [cephalexin] Rash   Vancomycin Rash   Pt stated, " I can't tolerate Vancomycin I get a red rash."      Medication List    TAKE these medications   acetaminophen 650 MG CR tablet Commonly known as: TYLENOL Take 1,300 mg by mouth every 8 (eight) hours as needed for pain.   celecoxib 200 MG capsule Commonly known as: CELEBREX Take 1 capsule (200 mg total) by mouth 2 (two) times daily. BRAND NAME NECESSARY What changed: additional instructions   cetirizine 10 MG tablet Commonly known as: ZYRTEC Take 10 mg by mouth daily.   clonazePAM 0.5 MG tablet Commonly known as: KLONOPIN Take 1 tablet (0.5 mg total) by mouth 2 (two) times daily as needed.   cyclobenzaprine 10 MG tablet Commonly known as: FLEXERIL take 1 tablet by mouth three times a day if needed What changed:   how much to take  how to take this  when to take this  reasons to take this  additional instructions   DULoxetine 60 MG capsule Commonly known as: CYMBALTA Take 2 capsules (120 mg total) by mouth daily. What changed: when to take this   Esomeprazole Magnesium 20 MG Tbec Take 20 mg by mouth at bedtime.   fluticasone 50 MCG/ACT nasal spray Commonly known as: FLONASE Place 2 sprays into both nostrils at bedtime.   furosemide 20 MG tablet Commonly known as: LASIX Take 20 mg by mouth daily as needed for edema.   hydrochlorothiazide 12.5 MG capsule Commonly known as: MICROZIDE Take 12.5 mg by mouth daily.   HYDROmorphone 2 MG tablet Commonly known as:  DILAUDID Take 1 tablet (2 mg total) by mouth every 4 (four) hours as needed for severe pain.   Krill Oil 500 MG Caps Take 500 mg by mouth daily.   lidocaine 5 % Commonly known as: LIDODERM apply 3 patches to affected area daily as directed   Melatonin 10 MG Caps Take 10 mg by mouth at bedtime.  Olopatadine HCl 0.6 % Soln Place 2 sprays into the nose daily.   ondansetron 4 MG tablet Commonly known as: ZOFRAN Take 4 mg by mouth every 8 (eight) hours as needed for nausea or vomiting.   pregabalin 150 MG capsule Commonly known as: Lyrica Take 1 capsule (150 mg total) by mouth 2 (two) times daily.   rizatriptan 10 MG tablet Commonly known as: MAXALT Take 10 mg by mouth every 2 (two) hours as needed for migraine. May repeat in 2 hours if needed   senna-docusate 8.6-50 MG tablet Commonly known as: Senokot-S Take 1 tablet by mouth 2 (two) times daily.   Tysabri 300 MG/15ML injection Generic drug: natalizumab Inject 300 mg into the vein every 30 (thirty) days. Takes for MS   verapamil 180 MG CR tablet Commonly known as: CALAN-SR Take 1 tablet (180 mg total) by mouth at bedtime.   Vitamin D3 125 MCG (5000 UT) Caps Take 5,000 Units by mouth daily.   zaleplon 10 MG capsule Commonly known as: SONATA Take 10 mg by mouth at bedtime.       FOLLOW UP VISIT:   Follow-up Information    Justice Britain, MD.   Specialty: Orthopedic Surgery Why: call to be seen in 10-14 days Contact information: [address] [place] [address] [phone]           DISCHARGE TO: Home   DISCHARGE CONDITION:  Thereasa Parkin Ginnie Marich for Dr. Justice Britain 03/25/2019, 8:50 AM

## 2019-03-25 NOTE — TOC Transition Note (Signed)
Transition of Care [facility]) - CM/SW Discharge Note   Patient Details  Name: Joan Ray MRN: [id number] Date of Birth: June 09, 1961  Transition [facility] CM/SW Contact:  Leeroy Cha, RN Phone Number: 03/25/2019, 9:49 AM   Clinical Narrative:    dcd to home with self care   Final next level of care: Home/Self Care Barriers to Discharge: No Barriers Identified   Patient Goals and CMS Choice        Discharge Placement                       Discharge Plan and Services   Discharge Planning Services: CM Consult                                 Social Determinants of Health (SDOH) Interventions     Readmission Risk Interventions No flowsheet data found.

## 2019-04-01 ENCOUNTER — Ambulatory Visit: Payer: 59 | Admitting: Neurology

## 2019-04-12 ENCOUNTER — Other Ambulatory Visit: Payer: Self-pay | Admitting: Neurology

## 2019-04-25 ENCOUNTER — Ambulatory Visit (INDEPENDENT_AMBULATORY_CARE_PROVIDER_SITE_OTHER): Payer: 59 | Admitting: Neurology

## 2019-04-25 ENCOUNTER — Encounter: Payer: Self-pay | Admitting: Neurology

## 2019-04-25 ENCOUNTER — Other Ambulatory Visit: Payer: Self-pay

## 2019-04-25 ENCOUNTER — Telehealth: Payer: Self-pay | Admitting: *Deleted

## 2019-04-25 VITALS — BP 143/83 | HR 66 | Temp 97.6°F | Ht 68.5 in | Wt 279.0 lb

## 2019-04-25 DIAGNOSIS — R208 Other disturbances of skin sensation: Secondary | ICD-10-CM

## 2019-04-25 DIAGNOSIS — G35 Multiple sclerosis: Secondary | ICD-10-CM | POA: Diagnosis not present

## 2019-04-25 DIAGNOSIS — G5603 Carpal tunnel syndrome, bilateral upper limbs: Secondary | ICD-10-CM

## 2019-04-25 DIAGNOSIS — R269 Unspecified abnormalities of gait and mobility: Secondary | ICD-10-CM

## 2019-04-25 DIAGNOSIS — G43009 Migraine without aura, not intractable, without status migrainosus: Secondary | ICD-10-CM | POA: Diagnosis not present

## 2019-04-25 DIAGNOSIS — Z6841 Body Mass Index (BMI) 40.0 and over, adult: Secondary | ICD-10-CM

## 2019-04-25 NOTE — Progress Notes (Addendum)
[facility]  PATIENT: Joan Ray DOB: 1960-09-07  REFERRING DOCTOR OR PCP:  Melina Schools [phone]) Also Joan Ray (PCP) SOURCE: Patient, MRI images on PACS and CD and notes from PCP and ortho  _________________________________   HISTORICAL  CHIEF COMPLAINT:  Chief Complaint  Patient presents with   Follow-up    RM 12,alone. Last seen 09/24/2018. She has been out of work since March 2020. She has right shoulder scope 12/2018 and left shoulder replacement 03/24/2019. Doing PT 3 days per week in [place], [place].    Multiple Sclerosis    On Tysabri. Last JCV 09/24/18 indeterminate, 0.21, inhibition assay: negative. NEEDS LABS   Medication Refill    Pt needing refills on verapamil, cyclobenzaprine, duloxetine    HISTORY OF PRESENT ILLNESS:  Joan Ray is a 58 y.o. right-handed patient with relapsing remitting MS diagnosed in 2010.     Update 04/25/2019: She is on Tysabri for relapsing remitting MS and she tolerates it well.  She has not had any exacerbations.  Her last JCV antibody titer was 0.21 and the inhibition assay was negative.   Mri brain 10/8/02020 had shown no new lesions.    Her gait is about the same.  Legs are strong.   She has recent shoulder surgery and is doing PT.  Hand strength seems worse on the left and reduced ROM in shoulders.   She has dysesthetic pain in her hands, worse on left since shoulder surgery.    Lyrica has not helped as much as it did initially, even on 150 mg twice a day.  Additionally, she is on duloxetine 120 mg that may help her dysesthetic pain.  NCV/EMG showed mild bilateral CTS in 2018.   She has mild urge incontinence.   Vision is blurry and she has difficulty using a computer for long times.  She has seen ophthalmology and no ocular issues (just refraction).    She notes fatigue that has worsened this year.   She does best in the mid day.  She has some depression and anxiety, helped by Cymbalta.   Cognition is doing fairly well.  Headaches are better on amlodipine.   She also has myalgias and joint pain especially knees.   She is on Cymbalta, Lyrica and Celebrex.      She has pain in the left leg, worse with certain positions.   This has been present a long time.    Due to reduced arm strength, fatigue and visual problems may not be able to go back to work.  Update 09/24/2018 (with Butler Denmark, NP):  Update 03/19/2018: She is on Tysabri and she tolerates it well.  She has not had any exacerbations.  Her last JCV antibody titer was 0.23 and the inhibition assay was negative.  Her gait is usually ok but she has one bad fall and tore her hamstring.   An injection helped for a while.  She denies much weakness in the legs but she does note some weakness in the hands at times.  She has dysesthetic pain in her legs and hands.    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  Additionally, she is on duloxetine 120 mg that may help her dysesthetic pain.  She has mild urge incontinence.   Vision is blurry when she uses eyes a lt like at work  with computer screen.   There is a gray halo around some objects and VF sometimes seems off.  She has seen ophthalmology and she states they recommended another MRI.    She notes fatigue.  She has some depression and anxiety, helped by Cymbalta.  Cognition is doing fairly well.  She also has rare migraine headaches and other more frequent milder headaches  She has done better on amlodipine.   She also has myalgias and joint pain especially knees.   She is on Cymbalta, Lyrica and Celebrex.    She feels symptoms were much better on brand name Cymbalta.     She is doing aquatic therapy and trying a diet and has lost 9 pounds.        Update 09/10/2017: She feels her MS is stable.    She is on Tysabri 300 mg q 4 weeks and tolerates it well.   Her last JCV Ab was  negative at 0.26.    Gait and leg strength are fine for the most part.  She is experiencing more dysesthetic pain as well as joint pain. She has some wrist / hand weakness at times.    .   She notes foot pain is doing worse.   She is on Lyrica.    She tolerates 100 mg bid but 300 mg causes cognitive slowing.    She is also on Duloxetine 120 mg.      per about her function is about the same. She notes some fatigue mood has been better on Cymbalta. The brand works better than the generic but insurance would not cover it. Cognition is fine.  Celebrex helps her arthritic pain but not the nerve pain.    She does better with the brand than the generic.       She has migraines.  Generic amlodipine did not help as much as the brand but brand is very expensive.   Calan SR 180 mg is not available currently.       She has some weight loss on phentermine plus metformin but not much.   She is not a candidate for gastric bypass due to gastric polyps.      From 03/12/2017: MS:   She is on Tysabri and tolerates it well..  She has been JCV Ab negative.  MRI 09/19/2015 showed no definite new lesions compared to 07/02/2010.  Migraine:  She reports migraines did better on brand name Norvasc than on amlodipine.      Neck/back pain/CTS:   She also has neck pain and back pain.  She also has carpal tunnel syndrome (on EMG by Dr. Starleen Blue but Dr. Nelva Bush felt due to shoulder impingement (by her report) Also more knee pain.     Insurance won't cover name brand Celebrex.  Other:   She is undergoing evaluation for bariatric surgery but stomach polyps were noted so this is on hold.  She is exercising and taking phentermine but weight is not improving.    Gait/strength: Gait is stable. She occasionally stumbles but has no falls..  No weakness and no numbness in legs.    Bladder: She has urinary frequency and urgency. This is mild.   No incontinence.     Vision: She had a good recovery from the optic neuritis. She denies any  significant problems with her vision now. She does wear glasses. Therell be some fluctuations in her vision at times.  Fatigue/sleep: She has fatigue and also notes excessive sweating.    She has  insomnia, but with melatonin and clonazepam (often needs 0.5 mg x 2) she falls asleep easily but wakes up and then can't fall back asleep.    RLS symptoms are much better on clonazepam and Lyrica. A sleep study in the past did not show sleep apnea but did show periodic limb movements of sleep.    GERD is a problem at night.    Mood/cognition: She has mild depression and anxiety and has been helped by Cymbalta.   She was switched to generic Cymbalta but it has not helped as much as the brand name.  . She notes mild cognitive issues.    Shehas poor attention and is easily distracted.    She has mild difficulty with executive function.    She has not had formal neurocognitive testing.    Migraine:   She has had migraine headaches and was on Norvasc with benefit However, it was switched to generic and migraines worsened.     Spine/limb pain history:    She reports having  L5-S1 fusion in October 2014 and C5-C7 fusion June 2016.   Although some symptoms improved after surgery, she continues to have a lot of difficulties with neck pain, arm pain, back pain and leg pain.    She was placed on Lyrica for her pain and was titrated to 200 mg twice a day but could not tolerate that dose. She has since been reduced to 100 mg by mouth twice a day and tolerates that dose but continues to have pain.      She reports some axial neck and lower back pain but the dysesthetic pain in the hands and feet is more troublesome.    Both hands have the dysesthetic pain but in the legs, the right foot is much worse than the left.   She gets some benefit from Lyrica.   She felt her pain had been better on Cymbalta than on Effexor. However, in 2013 when she was having more neurologic issues the Cymbalta was discontinued.   In the right foot, the  pain is most intense on the top of the foot and towards the ankle but not above the ankle.    I personally reviewed the MRI of the cervical spine from 10/07/2014.   It shows degenerative changes at C5-C6 resulting in left foraminal narrowing and changes at C6-C7 resulting right foraminal narrowing. The spinal cord appears normal. Plain films done after surgery showed a C5-C7 fusion and lumbar plain films showed the L5-S1 fusion.  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 She also had pain in the left eye. She went to an ophthalmologist who did some tests and referred her or an MRI. The MRI was abnormal, consistent with MS and she was referred to Dr. Maye Hides.   Usually, she was placed on weekly Avonex injections. She did not like the way that she felt on the injections. Additionally, an MRI of the brain showed that she had 2 new lesions. Therefore, she was switched to Tysabri in 2011.     On Tysabri, she has had no definite exacerbations, no change in MRI. Additionally she tolerates the medication very well. She has had some low positive JCV antibody titers. We do not have the actual numbers.    She switched from Dr. Starleen Blue to Dr. William Hamburger in [place] in 2013. She is interested in changing to an MS specialist.    Last MRI was early 2016 at [facility] in [place].     REVIEW OF SYSTEMS: Constitutional: No fevers, chills, sweats, or change in appetite.    She has insomnia and headaches and restless leg syndrome she reports a lot of fatigue. Eyes: No visual changes, double vision, eye pain Ear, nose and throat: No hearing loss, ear pain, nasal congestion, sore throat Cardiovascular: No chest pain, palpitations Respiratory: No shortness of breath at rest or with exertion.   No wheezes GastrointestinaI:  She has constipation.   No nausea, vomiting, diarrhea, abdominal pain, fecal incontinence Genitourinary: No dysuria, urinary retention or  frequency.  No nocturia. Musculoskeletal: as above Integumentary: No rash, skin lesions.   Some prurutis Neurological: as above Psychiatric: Notes depression > anxiety Endocrine: No palpitations, diaphoresis, change in appetite, change in weigh or increased thirst Hematologic/Lymphatic: No anemia, purpura, petechiae. Allergic/Immunologic: No itchy/runny eyes, nasal congestion, recent allergic reactions, rashes  ALLERGIES: Allergies  Allergen Reactions   Bee Venom Swelling   Hydrocodone-Acetaminophen Itching   Progesterone Other (See Comments) and Rash    increase/profuse sweating-flushing-   Sertraline Hcl Other (See Comments)   Statins Swelling    muscle and joint pain/tightness   Trazodone Other (See Comments)    dizziness   Betadine [Povidone Iodine]     Blisters   Codeine Itching    Rash   Darvocet [Propoxyphene N-Acetaminophen] Nausea And Vomiting   Diazepam     Increases agitation   Dilaudid [Hydromorphone Hcl] Itching   Doxycycline     Facial flushing   Keppra [Levetiracetam]     Shaking, nervousness, palpations and flushing   Lamictal [Lamotrigine]     Causes tongue and mouth to tingle   Lipitor [Atorvastatin]     Severe muscle and joint pain / ms relapse   Lyrica [Pregabalin] Other (See Comments)    Doses over 300mg  decrease memory   Neurontin [Gabapentin]     Impaired cognition, slower motor skills (intolerant)   Other     EDTA causes blisters   Oxycodone Hcl Itching   Pravastatin Other (See Comments)    Severe joint pain MS relapse   Sertraline Other (See Comments)    Increased nervousness   Topamax [Topiramate]     Confusion   Tramadol Other (See Comments)    "Nervous, jittery, felt weird, nausea"   Zonisamide     Back pain, headaches, insomnia, chest pressure   Cephalosporins Rash   Erythromycin Itching and Rash   Keflex [Cephalexin] Rash   Vancomycin Rash    Pt stated, " I can't tolerate Vancomycin I get a red  rash."    HOME MEDICATIONS:  Current Outpatient Medications:    acetaminophen (TYLENOL) 650 MG CR tablet, Take 1,300 mg by mouth every 8 (eight) hours as needed for pain., Disp: , Rfl:    CELEBREX 200 MG capsule, TAKE ONE CAPSULE BY MOUTH TWICE DAILY, Disp: 180 capsule, Rfl: 0   cetirizine (ZYRTEC) 10 MG tablet, Take 10 mg by mouth daily., Disp: , Rfl:    Cholecalciferol (VITAMIN D3) 125 MCG (5000 UT) CAPS, Take 5,000 Units by mouth daily. , Disp: , Rfl:    clonazePAM (KLONOPIN) 0.5 MG tablet, Take 1 tablet (0.5 mg total) by mouth 2 (two) times daily as needed., Disp: 180 tablet, Rfl: 0   cyclobenzaprine (FLEXERIL) 10 MG tablet, take 1 tablet by mouth three times a day if needed (Patient taking differently: Take 10 mg by mouth 3 (three) times daily as needed for muscle spasms. ), Disp: 270 tablet, Rfl: 1  DULoxetine (CYMBALTA) 60 MG capsule, Take 2 capsules (120 mg total) by mouth daily. (Patient taking differently: Take 120 mg by mouth 2 (two) times daily. ), Disp: 180 capsule, Rfl: 1   Esomeprazole Magnesium 20 MG TBEC, Take 20 mg by mouth at bedtime. , Disp: , Rfl:    fluticasone (FLONASE) 50 MCG/ACT nasal spray, Place 2 sprays into both nostrils at bedtime. , Disp: , Rfl:    furosemide (LASIX) 20 MG tablet, Take 20 mg by mouth daily as needed for edema., Disp: , Rfl:    hydrochlorothiazide (MICROZIDE) 12.5 MG capsule, Take 12.5 mg by mouth daily., Disp: , Rfl:    HYDROmorphone (DILAUDID) 2 MG tablet, Take 1 tablet (2 mg total) by mouth every 4 (four) hours as needed for severe pain., Disp: 20 tablet, Rfl: 0   Krill Oil 500 MG CAPS, Take 500 mg by mouth daily., Disp: , Rfl:    lidocaine (LIDODERM) 5 %, apply 3 patches to affected area daily as directed, Disp: 90 patch, Rfl: 6   Melatonin 10 MG CAPS, Take 10 mg by mouth at bedtime. , Disp: , Rfl:    natalizumab (TYSABRI) 300 MG/15ML injection, Inject 300 mg into the vein every 30 (thirty) days. Takes for MS, Disp: , Rfl:     Olopatadine HCl 0.6 % SOLN, Place 2 sprays into the nose daily., Disp: , Rfl:    ondansetron (ZOFRAN) 4 MG tablet, Take 4 mg by mouth every 8 (eight) hours as needed for nausea or vomiting., Disp: , Rfl:    pregabalin (LYRICA) 150 MG capsule, Take 1 capsule (150 mg total) by mouth 2 (two) times daily., Disp: 180 capsule, Rfl: 3   rizatriptan (MAXALT) 10 MG tablet, Take 10 mg by mouth every 2 (two) hours as needed for migraine. May repeat in 2 hours if needed, Disp: , Rfl:    senna-docusate (SENOKOT-S) 8.6-50 MG tablet, Take 1 tablet by mouth 2 (two) times daily., Disp: , Rfl:    verapamil (CALAN-SR) 180 MG CR tablet, Take 1 tablet (180 mg total) by mouth at bedtime., Disp: 90 tablet, Rfl: 1   zaleplon (SONATA) 10 MG capsule, Take 10 mg by mouth at bedtime., Disp: , Rfl:  No current facility-administered medications for this visit.   Facility-Administered Medications Ordered in Other Visits:    gadopentetate dimeglumine (MAGNEVIST) injection 20 mL, 20 mL, Intravenous, Once PRN, Urania Pearlman, Nanine Means, MD  PAST MEDICAL HISTORY: Past Medical History:  Diagnosis Date   Anemia    h/o of   Last infusion of iron  2008   Anxiety    Arthritis    osteo    Complication of anesthesia    Constipation    Depression    d/t decreased activity   GERD (gastroesophageal reflux disease)    Headache(784.0)    migraines   High triglycerides    MS (multiple sclerosis) (HCC)    Neuromuscular disorder ([place])    mutliple sclerosis--dx 10/2008   PONV (postoperative nausea and vomiting)    Restless leg syndrome    Wears glasses    Wears glasses     PAST SURGICAL HISTORY: Past Surgical History:  Procedure Laterality Date   ANTERIOR CERVICAL DECOMP/DISCECTOMY FUSION N/A 12/21/2014   Procedure: ANTERIOR CERVICAL DECOMPRESSION/DISCECTOMY FUSION 2 LEVELS C5-7;  Surgeon: Melina Schools, MD;  Location: [facility];  Service: Orthopedics;  Laterality: N/A;   BLADDER SUSPENSION     occas  blood in  urine   ENDOMETRIAL ABLATION  2010   FOOT ARTHRODESIS  Right 07/28/2013   Procedure: ARTHRODESIS RIGHT MIDFOOT OF THE FIRST -  THIRD TARSOMETATARSAL JOINTS ;  Surgeon: Wylene Simmer, MD;  Location: [facility];  Service: Orthopedics;  Laterality: Right;   GASTROCNEMIUS RECESSION Right 07/28/2013   Procedure: RIGHT GASTROCNEMIUS RECESSION;  Surgeon: Wylene Simmer, MD;  Location: [facility];  Service: Orthopedics;  Laterality: Right;   JOINT REPLACEMENT  2012   lt total knee   KNEE ARTHROSCOPY     bilateral  in 2010, has multiple scopes   MEDIAL PARTIAL KNEE REPLACEMENT     right   nerve blocks     SPINAL FUSION  05-05-2013   stress fracture     right 2-3 toe   TOTAL KNEE ARTHROPLASTY  2008   partial rt    TOTAL SHOULDER ARTHROPLASTY Left 03/24/2019   Procedure: TOTAL SHOULDER ARTHROPLASTY;  Surgeon: Justice Britain, MD;  Location: [facility];  Service: Orthopedics;  Laterality: Left;  169min     FAMILY HISTORY: Family History  Problem Relation Age of Onset   Heart disease Mother    Cancer Mother    Hypertension Mother    Depression Mother    Heart disease Other    Hypertension Maternal Grandmother    Diabetes Maternal Grandmother    Kidney disease Maternal Grandmother    Rheum arthritis Maternal Grandmother    Depression Maternal Grandmother    Hypertension Paternal Grandmother    Kidney disease Maternal Grandfather    Depression Maternal Grandfather    Depression Brother     SOCIAL HISTORY:  Social History   Socioeconomic History   Marital status: Married    Spouse name: Not on file   Number of children: 3   Years of education: Not on file   Highest education level: Not on file  Occupational History    Comment: Designer, multimedia strain: Not on file   Food insecurity    Worry: Not on file    Inability: Not on file   Transportation needs    Medical: Not on file    Non-medical: Not on file    Tobacco Use   Smoking status: Never Smoker   Smokeless tobacco: Never Used  Substance and Sexual Activity   Alcohol use: Yes    Comment: socially--wine & beer   Drug use: No   Sexual activity: Not on file  Lifestyle   Physical activity    Days per week: Not on file    Minutes per session: Not on file   Stress: Not on file  Relationships   Social connections    Talks on phone: Not on file    Gets together: Not on file    Attends religious service: Not on file    Active member of club or organization: Not on file    Attends meetings of clubs or organizations: Not on file    Relationship status: Not on file   Intimate partner violence    Fear of current or ex partner: Not on file    Emotionally abused: Not on file    Physically abused: Not on file    Forced sexual activity: Not on file  Other Topics Concern   Not on file  Social History Narrative   Lives at home with   Caffeine use:      PHYSICAL EXAM  Vitals:   04/25/19 1052  BP: (!) 143/83  Pulse: 66  Temp: 97.6 F (36.4 C)  Weight: 279 lb (126.6  kg)  Height: 5' 8.5" (1.74 m)    Body mass index is 41.8 kg/m.   General: The patient is well-developed and well-nourished and in no acute distress.  The head is normocephalic and atraumatic.  The neck is mildly tender.  She has reduced range of motion at the shoulders..  Neurologic Exam  Mental status: The patient is alert and oriented x 3 at the time of the examination. The patient has apparent normal recent and remote memory, with an apparently normal attention span and concentration ability.   Speech is normal.  Cranial nerves: Extraocular movements are full.  Facial strength and facial sensation is normal.  Trapezius strength is normal.  The tongue is midline, and the patient has symmetric elevation of the soft palate. No obvious hearing deficits are noted.  Motor:  Muscle bulk is normal.   Tone is normal. Strength is  5 / 5 in all 4 extremities  except 4+/5 bilateral APB muscles and 4+/5 right EHL  Sensory: There is a Tinel's sign at the wrists.  No Tinel signs at the elbows..  Sensory testing is intact to pinprick, soft touch and vibration sensation in arms.  There is allodynia in the L5 distribution of the right foot associated with numbness.   Sensation is better in the left foot.    Coordination: Cerebellar testing reveals good finger-nose-finger and heel-to-shin bilaterally.  Gait and station: Station is normal.   Gait and the tandem gait are wide.  Romberg is negative.   Reflexes: Deep tendon reflexes are symmetric and normal bilaterally.          DIAGNOSTIC DATA (LABS, IMAGING, TESTING) - I reviewed patient records, labs, notes, testing and imaging myself where available.  Lab Results  Component Value Date   WBC 6.0 03/15/2019   HGB 12.6 03/15/2019   HCT 39.2 03/15/2019   MCV 90.7 03/15/2019   PLT 202 03/15/2019      Component Value Date/Time   NA 142 03/15/2019 1105   NA 140 03/19/2018 1110   K 3.8 03/15/2019 1105   CL 105 03/15/2019 1105   CO2 29 03/15/2019 1105   GLUCOSE 128 (H) 03/15/2019 1105   BUN 15 03/15/2019 1105   BUN 15 03/19/2018 1110   CREATININE 0.70 03/15/2019 1105   CALCIUM 8.9 03/15/2019 1105   PROT 6.4 03/19/2018 1110   ALBUMIN 4.3 03/19/2018 1110   AST 21 03/19/2018 1110   ALT 23 03/19/2018 1110   ALKPHOS 98 03/19/2018 1110   BILITOT 0.7 03/19/2018 1110   GFRNONAA >60 03/15/2019 1105   GFRAA >60 03/15/2019 1105      ASSESSMENT AND PLAN  Multiple sclerosis ([place]) - Plan: Stratify JCV Antibody Test ([facility]), CBC with Differential/Platelet, MR CERVICAL SPINE WO CONTRAST  Dysesthesia - Plan: NCV with EMG(electromyography)  Carpal tunnel syndrome on both sides - Plan: NCV with EMG(electromyography)  1.   Continue Tysabri.  We will need to check a JCV antibody and CBC..   We will check an MRI of the cervical spine to determine if there is any progression that could explain her new  symptoms. If present, we will need to consider a different disease modifying therapy.  2.  She will continue medications for her pain, myalgias, depression.  Continue PT.  3.  She is advised to stay active and exercises as tolerated. 4.   Etiology of the numbness in the hands is uncertain, MRI of the cervical spine from 2012 and 2014 had not shown any MS lesions she does  have known mild carpal tunnel syndrome on nerve conduction studies a couple years ago.  We will recheck the nerve conduction studies to determine if there has been progression and consider referral for carpal tunnel release if present.  Additionally, we will check an MRI of the cervical spine to help assess for radiculopathy (she has history of lower cervical surgery in the past) and myelitis from [place].   Return in 6 months or sooner if there are new or worsening neurologic symptoms.  40-minute face-to-face evaluation with greater than one half the time counseling coordinating about her new sensory symptoms and possible relationship to MS or other issues.  Blakeleigh Domek A. [name], MD, PhD [id number], [id number] AM Certified in Neurology, Clinical Neurophysiology, Sleep Medicine, Pain Medicine and Neuroimaging  [facility] [address], [place], [place] [address] [phone]

## 2019-04-25 NOTE — Telephone Encounter (Signed)
JCV antibody lab placed in [facility] lock box for pick up. Results pending.

## 2019-04-26 ENCOUNTER — Telehealth: Payer: Self-pay | Admitting: Neurology

## 2019-04-26 LAB — CBC WITH DIFFERENTIAL/PLATELET
Basophils Absolute: 0.1 10*3/uL (ref 0.0–0.2)
Basos: 1 %
EOS (ABSOLUTE): 0.5 10*3/uL — ABNORMAL HIGH (ref 0.0–0.4)
Eos: 8 %
Hematocrit: 38.4 % (ref 34.0–46.6)
Hemoglobin: 12.6 g/dL (ref 11.1–15.9)
Immature Grans (Abs): 0.1 10*3/uL (ref 0.0–0.1)
Immature Granulocytes: 1 %
Lymphocytes Absolute: 3 10*3/uL (ref 0.7–3.1)
Lymphs: 45 %
MCH: 28.4 pg (ref 26.6–33.0)
MCHC: 32.8 g/dL (ref 31.5–35.7)
MCV: 87 fL (ref 79–97)
Monocytes Absolute: 0.4 10*3/uL (ref 0.1–0.9)
Monocytes: 7 %
NRBC: 2 % — ABNORMAL HIGH (ref 0–0)
Neutrophils Absolute: 2.4 10*3/uL (ref 1.4–7.0)
Neutrophils: 38 %
Platelets: 219 10*3/uL (ref 150–450)
RBC: 4.43 x10E6/uL (ref 3.77–5.28)
RDW: 14.5 % (ref 11.7–15.4)
WBC: 6.4 10*3/uL (ref 3.4–10.8)

## 2019-04-26 NOTE — Telephone Encounter (Signed)
[employer] auth: NPR via [employer] website order sent to GI. No auth they will reach out to the patient to schedule.  

## 2019-05-02 ENCOUNTER — Other Ambulatory Visit: Payer: Self-pay | Admitting: *Deleted

## 2019-05-02 MED ORDER — CYCLOBENZAPRINE HCL 10 MG PO TABS: ORAL_TABLET | ORAL | 1 refills | Status: DC

## 2019-05-02 MED ORDER — VERAPAMIL HCL ER 180 MG PO TBCR: 180.0000 mg | EXTENDED_RELEASE_TABLET | Freq: Every day | ORAL | 1 refills | Status: DC

## 2019-05-02 MED ORDER — DULOXETINE HCL 60 MG PO CPEP: 120.0000 mg | ORAL_CAPSULE | Freq: Every day | ORAL | 1 refills | Status: DC

## 2019-05-09 ENCOUNTER — Ambulatory Visit: Payer: 59 | Admitting: Physical Medicine & Rehabilitation

## 2019-05-09 NOTE — Telephone Encounter (Signed)
Received results from [facility] ab drawn on 04/25/19 positive, index: 0.90.

## 2019-05-13 ENCOUNTER — Ambulatory Visit
Admission: RE | Admit: 2019-05-13 | Discharge: 2019-05-13 | Disposition: A | Discharge status: Home / Self Care | Payer: 59 | Source: Ambulatory Visit | Attending: Neurology | Admitting: Neurology

## 2019-05-13 ENCOUNTER — Encounter: Payer: Self-pay | Admitting: Physical Medicine & Rehabilitation

## 2019-05-13 ENCOUNTER — Encounter: Payer: 59 | Attending: Physical Medicine & Rehabilitation | Admitting: Physical Medicine & Rehabilitation

## 2019-05-13 ENCOUNTER — Other Ambulatory Visit: Payer: Self-pay

## 2019-05-13 DIAGNOSIS — G35 Multiple sclerosis: Secondary | ICD-10-CM

## 2019-05-13 DIAGNOSIS — M7072 Other bursitis of hip, left hip: Secondary | ICD-10-CM | POA: Insufficient documentation

## 2019-05-13 DIAGNOSIS — M533 Sacrococcygeal disorders, not elsewhere classified: Secondary | ICD-10-CM | POA: Diagnosis not present

## 2019-05-13 NOTE — Progress Notes (Signed)
  PROCEDURE RECORD [facility]   Name: Joan Ray DOB:15-Mar-1961 MRN: [id number]  Date:05/13/2019  Physician: Alysia Penna, MD    Nurse/CMA: Rayneisha Bouza, CMA  Allergies:  Allergies  Allergen Reactions  . Bee Venom Swelling  . Hydrocodone-Acetaminophen Itching  . Progesterone Other (See Comments) and Rash    increase/profuse sweating-flushing-  . Sertraline Hcl Other (See Comments)  . Statins Swelling    muscle and joint pain/tightness  . Trazodone Other (See Comments)    dizziness  . Betadine [Povidone Iodine]     Blisters  . Codeine Itching    Rash  . Darvocet [Propoxyphene N-Acetaminophen] Nausea And Vomiting  . Diazepam     Increases agitation  . Dilaudid [Hydromorphone Hcl] Itching  . Doxycycline     Facial flushing  . Keppra [Levetiracetam]     Shaking, nervousness, palpations and flushing  . Lamictal [Lamotrigine]     Causes tongue and mouth to tingle  . Lipitor [Atorvastatin]     Severe muscle and joint pain / ms relapse  . Lyrica [Pregabalin] Other (See Comments)    Doses over 300mg  decrease memory  . Neurontin [Gabapentin]     Impaired cognition, slower motor skills (intolerant)  . Other     EDTA causes blisters  . Oxycodone Hcl Itching  . Pravastatin Other (See Comments)    Severe joint pain MS relapse  . Sertraline Other (See Comments)    Increased nervousness  . Topamax [Topiramate]     Confusion  . Tramadol Other (See Comments)    "Nervous, jittery, felt weird, nausea"  . Zonisamide     Back pain, headaches, insomnia, chest pressure  . Cephalosporins Rash  . Erythromycin Itching and Rash  . Keflex [Cephalexin] Rash  . Vancomycin Rash    Pt stated, " I can't tolerate Vancomycin I get a red rash."    Consent Signed: Yes.    Is patient diabetic? No.  CBG today?   Pregnant: No. LMP: Patient's last menstrual period was 04/27/2009. (age 80-55)  Anticoagulants: no Anti-inflammatory: yes  (celebrex) Antibiotics: no  Procedure: left L5 drsal ranmus, S1-3 lateral branch block  Position: Prone Start Time: 9:53am  End Time: 10:05am  Fluoro Time: 43s  RN/CMA Saudia Smyser, CMA Elmer Merwin, CMA    Time 9:37am 10:10am    BP 116/79 128/86    Pulse 72 73    Respirations 14 14    O2 Sat 96 94    S/S 6 6    Pain Level 6 6     D/C home with husband, patient A & O X 3, D/C instructions reviewed, and sits independently.

## 2019-05-13 NOTE — Progress Notes (Signed)
L5 dorsal ramus S1-S2-S3 lateral branch blocks under fluoroscopic guidance Left side  Informed consent was obtained after describing risks and benefits of the procedure with patient these include bleeding bruising and infection.  He elects to proceed and has given written consent.  Patient placed prone on fluoroscopy table Betadine prep sterile drape a 25-gauge 1.5 inch needle was used to anesthetize skin and subcu tissue with 1% lidocaine 1 cc into each of 4 sites.  Then a 22-gauge 5" needle was inserted under fluoroscopic guidance for starting the S1 SAP sacral ala junction.  Bone contact made.  Isovue 200 x 0.5 mL demonstrated no intravascular uptake then 0.5 mL of 2% lidocaine was injected.  Then the lateral aspect of the S1, S2, S3 foramen was targeted.  Bone contact made out, Isovue-200 times 0.5 mL demonstrated no nerve root or intravascular uptake within 0.5 mL of 2% lidocaine solution was injected after negative drawback for blood.  Patient tolerated procedure well.  Postinjection instructions given. 

## 2019-05-13 NOTE — Patient Instructions (Signed)
Sacroiliac Joint Dysfunction  Sacroiliac joint dysfunction is a condition that causes inflammation on one or both sides of the sacroiliac (SI) joint. The SI joint connects the lower part of the spine (sacrum) with the two upper portions of the pelvis (ilium). This condition causes deep aching or burning pain in the low back. In some cases, the pain may also spread into one or both buttocks, hips, or thighs. What are the causes? This condition may be caused by:  Pregnancy. During pregnancy, extra stress is put on the SI joints because the pelvis widens.  Injury, such as: ? Injuries from car accidents. ? Sports-related injuries. ? Work-related injuries.  Having one leg that is shorter than the other.  Conditions that affect the joints, such as: ? Rheumatoid arthritis. ? Gout. ? Psoriatic arthritis. ? Joint infection (septic arthritis). Sometimes, the cause of SI joint dysfunction is not known. What are the signs or symptoms? Symptoms of this condition include:  Aching or burning pain in the lower back. The pain may also spread to other areas, such as: ? Buttocks. ? Groin. ? Thighs.  Muscle spasms in or around the painful areas.  Increased pain when standing, walking, running, stair climbing, bending, or lifting. How is this diagnosed? This condition is diagnosed with a physical exam and medical history. During the exam, the health care provider may move one or both of your legs to different positions to check for pain. Various tests may be done to confirm the diagnosis, including:  Imaging tests to look for other causes of pain. These may include: ? MRI. ? CT scan. ? Bone scan.  Diagnostic injection. A numbing medicine is injected into the SI joint using a needle. If your pain is temporarily improved or stopped after the injection, this can indicate that SI joint dysfunction is the problem. How is this treated? Treatment depends on the cause and severity of your condition.  Treatment options may include:  Ice or heat applied to the lower back area after an injury. This may help reduce pain and muscle spasms.  Medicines to relieve pain or inflammation or to relax the muscles.  Wearing a back brace (sacroiliac brace) to help support the joint while your back is healing.  Physical therapy to increase muscle strength around the joint and flexibility at the joint. This may also involve learning proper body positions and ways of moving to relieve stress on the joint.  Direct manipulation of the SI joint.  Injections of steroid medicine into the joint to reduce pain and swelling.  Radiofrequency ablation to burn away nerves that are carrying pain messages from the joint.  Use of a device that provides electrical stimulation to help reduce pain at the joint.  Surgery to put in screws and plates that limit or prevent joint motion. This is rare. Follow these instructions at home: Medicines  Take over-the-counter and prescription medicines only as told by your health care provider.  Do not drive or use heavy machinery while taking prescription pain medicine.  If you are taking prescription pain medicine, take actions to prevent or treat constipation. Your health care provider may recommend that you: ? Drink enough fluid to keep your urine pale yellow. ? Eat foods that are high in fiber, such as fresh fruits and vegetables, whole grains, and beans. ? Limit foods that are high in fat and processed sugars, such as fried or sweet foods. ? Take an over-the-counter or prescription medicine for constipation. If you have a brace:    Wear the brace as told by your health care provider. Remove it only as told by your health care provider.  Keep the brace clean.  If the brace is not waterproof: ? Do not let it get wet. ? Cover it with a watertight covering when you take a bath or a shower. Managing pain, stiffness, and swelling      Icing can help with pain and  swelling. Heat may help with muscle tension or spasms. Ask your health care provider if you should use ice or heat.  If directed, put ice on the affected area: ? If you have a removable brace, remove it as told by your health care provider. ? Put ice in a plastic bag. ? Place a towel between your skin and the bag. ? Leave the ice on for 20 minutes, 2-3 times a day.  If directed, apply heat to the affected area. Use the heat source that your health care provider recommends, such as a moist heat pack or a heating pad. ? Place a towel between your skin and the heat source. ? Leave the heat on for 20-30 minutes. ? Remove the heat if your skin turns bright red. This is especially important if you are unable to feel pain, heat, or cold. You may have a greater risk of getting burned. General instructions  Rest as needed. Ask your health care provider what activities are safe for you.  Return to your normal activities as told by your health care provider.  Exercise as directed by your health care provider or physical therapist.  Do not use any products that contain nicotine or tobacco, such as cigarettes and e-cigarettes. These can delay bone healing. If you need help quitting, ask your health care provider.  Keep all follow-up visits as told by your health care provider. This is important. Contact a health care provider if:  Your pain is not controlled with medicine.  You have a fever.  Your pain is getting worse. Get help right away if:  You have weakness, numbness, or tingling in your legs or feet.  You lose control of your bladder or bowel. Summary  Sacroiliac joint dysfunction is a condition that causes inflammation on one or both sides of the sacroiliac (SI) joint.  This condition causes deep aching or burning pain in the low back. In some cases, the pain may also spread into one or both buttocks, hips, or thighs.  Treatment depends on the cause and severity of your condition.  It may include medicines to reduce pain and swelling or to relax muscles. This information is not intended to replace advice given to you by your health care provider. Make sure you discuss any questions you have with your health care provider. Document Released: 09/19/2008 Document Revised: 02/17/2018 Document Reviewed: 08/03/2017 Elsevier Patient Education  2020 [employer]  

## 2019-05-16 ENCOUNTER — Telehealth: Payer: Self-pay | Admitting: *Deleted

## 2019-05-16 ENCOUNTER — Telehealth: Payer: Self-pay | Admitting: Neurology

## 2019-05-16 NOTE — Telephone Encounter (Signed)
-----   Message from Britt Bottom, MD sent at 05/15/2019  6:21 PM EST ----- Please let the patient know that the spinal cord looked good (no MS in the spinal cord).  She does have disc degenerative changes  The changes have progressed a little bit below her previous surgery at C7-T1 and T1-T2 (compared to 2011)  However, there is no nerve root compression or spinal stenosis.

## 2019-05-16 NOTE — Telephone Encounter (Signed)
I spoke to Joan Ray.  The JCV antibody returned positive at 0.9.  This is right at the transition between "low positive" and "little positive".  Therefore her risk of PML is probably between 1: 2000 and 1: 300.  We discussed 2 options: 1.   Extend the interval of dosing to every 6 weeks.  Although not proven, some studies imply that the risk could be lower with prolonged intervals. 2.   Switch to a different medication.  Because she has done very well on Tysabri, she prefers to extend the dosing to every 6 weeks.  I also went over her cervical spine MRI.  It did not show any MS lesions in the spinal cord but she does have multilevel degenerative changes and there has been some progression at C7-T1 and T1-T2 since her last MRI in 2011.  At C7-T1, there is foraminal narrowing to the right.  There did not appear to be foraminal narrowing to the left.  She is scheduled to have an NCV/EMG with me later this month.

## 2019-05-17 NOTE — Telephone Encounter (Signed)
Provided infusion suite with new orders for Tysabri 300mg  q6 weeks. Sent copy to be scanned to epic.

## 2019-05-19 ENCOUNTER — Telehealth: Payer: Self-pay

## 2019-05-19 MED ORDER — DULOXETINE HCL 60 MG PO CPEP: 120.0000 mg | ORAL_CAPSULE | Freq: Every day | ORAL | 1 refills | Status: DC

## 2019-05-19 NOTE — Telephone Encounter (Signed)
Received a fax from [facility] where the patient is requesting a refill on her DULoxetine (CYMBALTA) 60 MG capsule

## 2019-05-19 NOTE — Addendum Note (Signed)
Addended by: Arlice Colt A on: 05/19/2019 12:09 PM   Modules accepted: Orders

## 2019-05-24 ENCOUNTER — Ambulatory Visit (INDEPENDENT_AMBULATORY_CARE_PROVIDER_SITE_OTHER): Payer: 59 | Admitting: Neurology

## 2019-05-24 ENCOUNTER — Other Ambulatory Visit: Payer: Self-pay

## 2019-05-24 DIAGNOSIS — R208 Other disturbances of skin sensation: Secondary | ICD-10-CM

## 2019-05-24 DIAGNOSIS — M79642 Pain in left hand: Secondary | ICD-10-CM | POA: Diagnosis not present

## 2019-05-24 DIAGNOSIS — M79641 Pain in right hand: Secondary | ICD-10-CM

## 2019-05-24 DIAGNOSIS — G5603 Carpal tunnel syndrome, bilateral upper limbs: Secondary | ICD-10-CM | POA: Insufficient documentation

## 2019-05-24 DIAGNOSIS — G35 Multiple sclerosis: Secondary | ICD-10-CM | POA: Diagnosis not present

## 2019-05-24 DIAGNOSIS — M5412 Radiculopathy, cervical region: Secondary | ICD-10-CM

## 2019-05-24 MED ORDER — CLONAZEPAM 0.5 MG PO TABS: 0.5000 mg | ORAL_TABLET | Freq: Two times a day (BID) | ORAL | 0 refills | Status: DC | PRN

## 2019-05-24 MED ORDER — PREGABALIN 150 MG PO CAPS: 150.0000 mg | ORAL_CAPSULE | Freq: Two times a day (BID) | ORAL | 3 refills | Status: DC

## 2019-05-24 NOTE — Progress Notes (Signed)
Full Name: Joan Ray Gender: Female MRN #: [id number] Date of Birth: January 28, 1961    Visit Date: 05/24/2019 08:11 Age: 58 Years 17 Months Old Examining Physician: Arlice Colt, MD  Referring Physician: Arlice Colt, MD    History: [name] is a 58 year old woman with multiple sclerosis.  She is having pain and numbness in the hands, left greater than right and also in the left arm.  She has a history of cervical fusion at C5-C7 in the past.  Nerve conduction studies: Left median motor response had a delayed distal latency across the wrist with normal amplitude and normal forearm conduction.  The right median and both ulnar sensory responses had normal distal latencies, amplitudes and conduction velocities.  F-wave responses of the ulnar nerves were normal.  Both median sensory responses had delayed peak latencies, left worse than right and reduced amplitudes, left worse than right.  Both ulnar sensory responses were normal.  Electromyography: Needle EMG of selected muscles of the left arm and the right hand was performed.    In the left arm, there were some polyphasic motor units with reduced recruitment in the left triceps and left flexor carpi ulnaris and some increase polyphasic units in the left extensor digitorum communis.  Other muscles tested in the left arm had normal motor unit recruitment and morphology.  None of the muscles had increase spontaneous activity.  The right first dorsal interosseous and APB muscles had normal motor unit morphology and recruitment.  There was no spontaneous activity.  Impression: This NCV/EMG study shows the following: 1.   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 2.   Mild median neuropathy at the right wrist (mild carpal tunnel syndrome). 3.   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  [name] A. [name], MD, PhD, FAAN Certified in Neurology, Clinical  Neurophysiology, Sleep Medicine, Pain Medicine and Neuroimaging Director, [place] [facility] [address] [place], [place] [address] [phone]         [facility]    Nerve / Sites Muscle Latency Ref. Amplitude Ref. Rel Amp Segments Distance Velocity Ref. Area    ms ms mV mV %  cm m/s m/s mVms  L Median - APB     Wrist APB 5.0 ?4.4 6.6 ?4.0 100 Wrist - APB 7   21.4     Upper arm APB 9.5  5.7  86.7 Upper arm - Wrist 23 51 ?49 19.3  R Median - APB     Wrist APB 4.3 ?4.4 6.1 ?4.0 100 Wrist - APB 7   20.7     Upper arm APB 9.0  6.3  102 Upper arm - Wrist 23 50 ?49 21.2  L Ulnar - ADM     Wrist ADM 2.7 ?3.3 10.9 ?6.0 100 Wrist - ADM 7   27.1     B.Elbow ADM 6.2  9.2  84.9 B.Elbow - Wrist 20 56 ?49 25.3     A.Elbow ADM 8.0  9.1  98.7 A.Elbow - B.Elbow 10 56 ?49 25.4         A.Elbow - Wrist      R Ulnar - ADM     Wrist ADM 2.2 ?3.3 10.7 ?6.0 100 Wrist - ADM 7   28.1     B.Elbow ADM 6.0  9.0  84.4 B.Elbow - Wrist 20 53 ?49 25.1     A.Elbow  ADM 7.8  9.0  100 A.Elbow - B.Elbow 10 55 ?49 25.1         A.Elbow - Wrist                 SNC    Nerve / Sites Rec. Site Peak Lat Ref.  Amp Ref. Segments Distance    ms ms V V  cm  L Median - Orthodromic (Dig II, Mid palm)     Dig II Wrist 4.9 ?3.4 6 ?10 Dig II - Wrist 13  R Median - Orthodromic (Dig II, Mid palm)     Dig II Wrist 4.4 ?3.4 8 ?10 Dig II - Wrist 13  L Ulnar - Orthodromic, (Dig V, Mid palm)     Dig V Wrist 2.7 ?3.1 6 ?5 Dig V - Wrist 11  R Ulnar - Orthodromic, (Dig V, Mid palm)     Dig V Wrist 2.4 ?3.1 5 ?5 Dig V - Wrist 48              F  Wave    Nerve F Lat Ref.   ms ms  L Ulnar - ADM 31.3 ?32.0  R Ulnar - ADM 29.7 ?32.0         EMG full       EMG Summary Table    Spontaneous MUAP Recruitment  Muscle IA Fib PSW Fasc Other Amp Dur. Poly Pattern  L. Deltoid Normal None None None _______ Normal Normal Normal Normal  L. Triceps brachii  Normal None None None _______ Normal Normal 1+ Reduced  L. Biceps brachii Normal None None None _______ Normal Normal Normal Normal  L. Extensor digitorum communis Normal None None None _______ Normal Normal 1+ Normal  L. Pronator teres Normal None None None _______ Normal Normal Normal Normal  L. First dorsal interosseous Normal None None None _______ Normal Normal Normal Normal  L. Abductor pollicis brevis Normal None None None _______ Normal Normal Normal Normal  L. Flexor carpi ulnaris Normal None None None _______ Normal Normal 1+ Reduced  R. First dorsal interosseous Normal None None None _______ Normal Normal Normal Normal  R. Abductor pollicis brevis Normal None None None _______ Normal Normal Normal Normal

## 2019-05-24 NOTE — Progress Notes (Signed)
[facility]  PATIENT: Joan Ray DOB: 1960/09/25  REFERRING DOCTOR OR PCP:  Melina Schools [phone]) [name] (PCP) SOURCE: Patient, MRI images on PACS and CD and notes from PCP and ortho  _________________________________   HISTORICAL  CHIEF COMPLAINT:  Chief Complaint  Patient presents with  . Numbness    Left greater than right hand associated with pain  . Multiple Sclerosis    On Tysabri.  JCV antibody recently converted from negative to positive.    HISTORY OF PRESENT ILLNESS:  Joan Ray is a 58 y.o. right-handed patient with MS and bilateral hand numbness diagnosed in 2010.     Update 05/24/2019:  At her last visit on 04/25/2019, we checked JCV antibody.  It was positive at 0.9 and her previous reading was negative at 0.21.  We discussed that we might be able to lower the risk of JCV by extending the interval between Tysabri doses.  However, if her titer rises much further we will need to convert to a different medication for the MS.  At the last visit she was complaining of left greater than right hand pain and numbness.  A nerve conduction and EMG study today showed bilateral carpal tunnel syndrome, worse on the left and a mild left chronic C7 radiculopathy without any active features.  We discussed referring her to a hand surgeon as she has noted increasing pain over the past couple months.  Additionally, the carpal tunnel syndrome on the left has worsened compared to the 2018 NCV/EMG study  Update 04/25/2019: She is on Tysabri and she tolerates it well.  She has not had any exacerbations.  Her last JCV antibody titer was 0.21 and the inhibition assay was negative.   Mri brain 10/8/02020 had shown no new lesions.    Her gait is about the same.  Legs are strong.   She has recent shoulder surgery and is doing PT.  Hand strength seems worse on the left and reduced ROM in shoulders.   She has dysesthetic pain in her hands, worse on  left since shoulder surgery.    Lyrica has not helped as much as it did initially, even on 150 mg twice a day.  Additionally, she is on duloxetine 120 mg that may help her dysesthetic pain.  NCV/EMG showed mild bilateral CTS in 2018.   She has mild urge incontinence.   Vision is blurry and she has difficulty using a computer for long times.  She has seen ophthalmology and no ocular issues (just refraction).    She notes fatigue that has worsened this year.   She does best in the mid day.  She has some depression and anxiety, helped by Cymbalta.  Cognition is doing fairly well.  Headaches are better on amlodipine.   She also has myalgias and joint pain especially knees.   She is on Cymbalta, Lyrica and Celebrex.      She has pain in the left leg, worse with certain positions.   This has been present a long time.    Due to reduced arm strength, fatigue and visual problems may not be able to go back to work.  Update 09/24/2018 (with Butler Denmark, NP):  Update 03/19/2018: She is on Tysabri and she tolerates it well.  She has not had any exacerbations.  Her last JCV antibody titer was 0.23 and the inhibition assay was negative.  Her gait is usually ok but she has one bad fall and tore her  hamstring.   An injection helped for a while.  She denies much weakness in the legs but she does note some weakness in the hands at times.  She has dysesthetic pain in her legs and hands.    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  Additionally, she is on duloxetine 120 mg that may help her dysesthetic pain.  She has mild urge incontinence.   Vision is blurry when she uses eyes a lt like at work with computer screen.   There is a gray halo around some objects and VF sometimes seems off.  She has seen ophthalmology and she states they recommended another MRI.    She notes fatigue.  She has some depression and  anxiety, helped by Cymbalta.  Cognition is doing fairly well.  She also has rare migraine headaches and other more frequent milder headaches  She has done better on amlodipine.   She also has myalgias and joint pain especially knees.   She is on Cymbalta, Lyrica and Celebrex.    She feels symptoms were much better on brand name Cymbalta.     She is doing aquatic therapy and trying a diet and has lost 9 pounds.        Update 09/10/2017: She feels her MS is stable.    She is on Tysabri 300 mg q 4 weeks and tolerates it well.   Her last JCV Ab was negative at 0.26.    Gait and leg strength are fine for the most part.  She is experiencing more dysesthetic pain as well as joint pain. She has some wrist / hand weakness at times.    .   She notes foot pain is doing worse.   She is on Lyrica.    She tolerates 100 mg bid but 300 mg causes cognitive slowing.    She is also on Duloxetine 120 mg.      per about her function is about the same. She notes some fatigue mood has been better on Cymbalta. The brand works better than the generic but insurance would not cover it. Cognition is fine.  Celebrex helps her arthritic pain but not the nerve pain.    She does better with the brand than the generic.       She has migraines.  Generic amlodipine did not help as much as the brand but brand is very expensive.   Calan SR 180 mg is not available currently.       She has some weight loss on phentermine plus metformin but not much.   She is not a candidate for gastric bypass due to gastric polyps.      From 03/12/2017: MS:   She is on Tysabri and tolerates it well..  She has been JCV Ab negative.  MRI 09/19/2015 showed no definite new lesions compared to 07/02/2010.  Migraine:  She reports migraines did better on brand name Norvasc than on amlodipine.      Neck/back pain/CTS:   She also has neck pain and back pain.  She also has carpal tunnel syndrome (on EMG by Dr. Starleen Blue but Dr. Nelva Bush felt due to shoulder impingement  (by her report) Also more knee pain.     Insurance won't cover name brand Celebrex.  Other:   She is undergoing evaluation for bariatric surgery but stomach polyps were noted  so this is on hold.  She is exercising and taking phentermine but weight is not improving.    Gait/strength: Gait is stable. She occasionally stumbles but has no falls..  No weakness and no numbness in legs.    Bladder: She has urinary frequency and urgency. This is mild.   No incontinence.     Vision: She had a good recovery from the optic neuritis. She denies any significant problems with her vision now. She does wear glasses. There'll be some fluctuations in her vision at times.  Fatigue/sleep: She has fatigue and also notes excessive sweating.    She has insomnia, but with melatonin and clonazepam (often needs 0.5 mg x 2) she falls asleep easily but wakes up and then can't fall back asleep.    RLS symptoms are much better on clonazepam and Lyrica. A sleep study in the past did not show sleep apnea but did show periodic limb movements of sleep.    GERD is a problem at night.    Mood/cognition: She has mild depression and anxiety and has been helped by Cymbalta.   She was switched to generic Cymbalta but it has not helped as much as the brand name.  . She notes mild cognitive issues.    Shehas poor attention and is easily distracted.    She has mild difficulty with executive function.    She has not had formal neurocognitive testing.    Migraine:   She has had migraine headaches and was on Norvasc with benefit However, it was switched to generic and migraines worsened.     Spine/limb pain history:    She reports having  L5-S1 fusion in October 2014 and C5-C7 fusion June 2016.   Although some symptoms improved after surgery, she continues to have a lot of difficulties with neck pain, arm pain, back pain and leg pain.    She was placed on Lyrica for her pain and was titrated to 200 mg twice a day but could not tolerate that  dose. She has since been reduced to 100 mg by mouth twice a day and tolerates that dose but continues to have pain.      She reports some axial neck and lower back pain but the dysesthetic pain in the hands and feet is more troublesome.    Both hands have the dysesthetic pain but in the legs, the right foot is much worse than the left.   She gets some benefit from Lyrica.   She felt her pain had been better on Cymbalta than on Effexor. However, in 2013 when she was having more neurologic issues the Cymbalta was discontinued.   In the right foot, the pain is most intense on the top of the foot and towards the ankle but not above the ankle.    I personally reviewed the MRI of the cervical spine from 10/07/2014.   It shows degenerative changes at C5-C6 resulting in left foraminal narrowing and changes at C6-C7 resulting right foraminal narrowing. The spinal cord appears normal. Plain films done after surgery showed a C5-C7 fusion and lumbar plain films showed the L5-S1 fusion.  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 She also had pain in the left eye. She went to an ophthalmologist who did some tests and referred her or an MRI. The MRI was abnormal, consistent with MS and she was referred to Dr. Maye Hides.   Usually, she was placed on weekly Avonex  injections. She did not like the way that she felt on the injections. Additionally, an MRI of the brain showed that she had 2 new lesions. Therefore, she was switched to Tysabri in 2011.     On Tysabri, she has had no definite exacerbations, no change in MRI. Additionally she tolerates the medication very well. She has had some low positive JCV antibody titers. We do not have the actual numbers.    She switched from Dr. Starleen Blue to Dr. William Hamburger in [place] in 2013. She is interested in changing to an [place].    Last MRI was early 2016 at [facility] in [place].     REVIEW OF SYSTEMS: Constitutional: No  fevers, chills, sweats, or change in appetite.    She has insomnia and headaches and restless leg syndrome she reports a lot of fatigue. Eyes: No visual changes, double vision, eye pain Ear, nose and throat: No hearing loss, ear pain, nasal congestion, sore throat Cardiovascular: No chest pain, palpitations Respiratory: No shortness of breath at rest or with exertion.   No wheezes GastrointestinaI:  She has constipation.   No nausea, vomiting, diarrhea, abdominal pain, fecal incontinence Genitourinary: No dysuria, urinary retention or frequency.  No nocturia. Musculoskeletal: as above Integumentary: No rash, skin lesions.   Some prurutis Neurological: as above Psychiatric: Notes depression > anxiety Endocrine: No palpitations, diaphoresis, change in appetite, change in weigh or increased thirst Hematologic/Lymphatic: No anemia, purpura, petechiae. Allergic/Immunologic: No itchy/runny eyes, nasal congestion, recent allergic reactions, rashes  ALLERGIES: Allergies  Allergen Reactions  . Bee Venom Swelling  . Hydrocodone-Acetaminophen Itching  . Progesterone Other (See Comments) and Rash    increase/profuse sweating-flushing-  . Sertraline Hcl Other (See Comments)  . Statins Swelling    muscle and joint pain/tightness  . Trazodone Other (See Comments)    dizziness  . Betadine [Povidone Iodine]     Blisters  . Codeine Itching    Rash  . Darvocet [Propoxyphene N-Acetaminophen] Nausea And Vomiting  . Diazepam     Increases agitation  . Dilaudid [Hydromorphone Hcl] Itching  . Doxycycline     Facial flushing  . Keppra [Levetiracetam]     Shaking, nervousness, palpations and flushing  . Lamictal [Lamotrigine]     Causes tongue and mouth to tingle  . Lipitor [Atorvastatin]     Severe muscle and joint pain / ms relapse  . Lyrica [Pregabalin] Other (See Comments)    Doses over 300mg  decrease memory  . Neurontin [Gabapentin]     Impaired cognition, slower motor skills  (intolerant)  . Other     EDTA causes blisters  . Oxycodone Hcl Itching  . Pravastatin Other (See Comments)    Severe joint pain MS relapse  . Sertraline Other (See Comments)    Increased nervousness  . Topamax [Topiramate]     Confusion  . Tramadol Other (See Comments)    "Nervous, jittery, felt weird, nausea"  . Zonisamide     Back pain, headaches, insomnia, chest pressure  . Cephalosporins Rash  . Erythromycin Itching and Rash  . Keflex [Cephalexin] Rash  . Vancomycin Rash    Pt stated, " I can't tolerate Vancomycin I get a red rash."    HOME MEDICATIONS:  Current Outpatient Medications:  .  acetaminophen (TYLENOL) 650 MG CR tablet, Take 1,300 mg by mouth every 8 (eight) hours as needed for pain., Disp: , Rfl:  .  CELEBREX 200 MG capsule, TAKE ONE CAPSULE BY MOUTH TWICE DAILY, Disp: 180 capsule,  Rfl: 0 .  cetirizine (ZYRTEC) 10 MG tablet, Take 10 mg by mouth daily., Disp: , Rfl:  .  Cholecalciferol (VITAMIN D3) 125 MCG (5000 UT) CAPS, Take 5,000 Units by mouth daily. , Disp: , Rfl:  .  clonazePAM (KLONOPIN) 0.5 MG tablet, Take 1 tablet (0.5 mg total) by mouth 2 (two) times daily as needed., Disp: 180 tablet, Rfl: 0 .  cyclobenzaprine (FLEXERIL) 10 MG tablet, take 1 tablet by mouth three times a day if needed, Disp: 270 tablet, Rfl: 1 .  DULoxetine (CYMBALTA) 60 MG capsule, Take 2 capsules (120 mg total) by mouth daily., Disp: 180 capsule, Rfl: 1 .  Esomeprazole Magnesium 20 MG TBEC, Take 20 mg by mouth at bedtime. , Disp: , Rfl:  .  fenofibrate (TRICOR) 145 MG tablet, Take 145 mg by mouth daily., Disp: , Rfl:  .  fluticasone (FLONASE) 50 MCG/ACT nasal spray, Place 2 sprays into both nostrils at bedtime. , Disp: , Rfl:  .  furosemide (LASIX) 20 MG tablet, Take 20 mg by mouth daily as needed for edema., Disp: , Rfl:  .  hydrochlorothiazide (MICROZIDE) 12.5 MG capsule, Take 25 mg by mouth daily. , Disp: , Rfl:  .  HYDROmorphone (DILAUDID) 2 MG tablet, Take 1 tablet (2 mg total) by  mouth every 4 (four) hours as needed for severe pain. (Patient not taking: Reported on 05/13/2019), Disp: 20 tablet, Rfl: 0 .  Krill Oil 500 MG CAPS, Take 500 mg by mouth daily., Disp: , Rfl:  .  lidocaine (LIDODERM) 5 %, apply 3 patches to affected area daily as directed, Disp: 90 patch, Rfl: 6 .  Melatonin 10 MG CAPS, Take 10 mg by mouth at bedtime. , Disp: , Rfl:  .  natalizumab (TYSABRI) 300 MG/15ML injection, Inject 300 mg into the vein every 30 (thirty) days. Takes for MS, Disp: , Rfl:  .  Olopatadine HCl 0.6 % SOLN, Place 2 sprays into the nose daily., Disp: , Rfl:  .  ondansetron (ZOFRAN) 4 MG tablet, Take 4 mg by mouth every 8 (eight) hours as needed for nausea or vomiting., Disp: , Rfl:  .  pregabalin (LYRICA) 150 MG capsule, Take 1 capsule (150 mg total) by mouth 2 (two) times daily., Disp: 180 capsule, Rfl: 3 .  rizatriptan (MAXALT) 10 MG tablet, Take 10 mg by mouth every 2 (two) hours as needed for migraine. May repeat in 2 hours if needed, Disp: , Rfl:  .  senna-docusate (SENOKOT-S) 8.6-50 MG tablet, Take 1 tablet by mouth 2 (two) times daily., Disp: , Rfl:  .  verapamil (CALAN-SR) 180 MG CR tablet, Take 1 tablet (180 mg total) by mouth at bedtime., Disp: 90 tablet, Rfl: 1 .  zaleplon (SONATA) 10 MG capsule, Take 10 mg by mouth at bedtime., Disp: , Rfl:  No current facility-administered medications for this visit.   Facility-Administered Medications Ordered in Other Visits:  .  gadopentetate dimeglumine (MAGNEVIST) injection 20 mL, 20 mL, Intravenous, Once PRN, Alcides Nutting, Nanine Means, MD  PAST MEDICAL HISTORY: Past Medical History:  Diagnosis Date  . Anemia    h/o of   Last infusion of iron  2008  . Anxiety   . Arthritis    osteo   . Complication of anesthesia   . Constipation   . Depression    d/t decreased activity  . GERD (gastroesophageal reflux disease)   . Headache(784.0)    migraines  . High triglycerides   . MS (multiple sclerosis) ([place])   . Neuromuscular  disorder  ([place])    mutliple sclerosis--dx 10/2008  . PONV (postoperative nausea and vomiting)   . Restless leg syndrome   . Wears glasses   . Wears glasses     PAST SURGICAL HISTORY: Past Surgical History:  Procedure Laterality Date  . ANTERIOR CERVICAL DECOMP/DISCECTOMY FUSION N/A 12/21/2014   Procedure: ANTERIOR CERVICAL DECOMPRESSION/DISCECTOMY FUSION 2 LEVELS C5-7;  Surgeon: Melina Schools, MD;  Location: [facility];  Service: Orthopedics;  Laterality: N/A;  . BLADDER SUSPENSION     occas  blood in urine  . ENDOMETRIAL ABLATION  2010  . FOOT ARTHRODESIS Right 07/28/2013   Procedure: ARTHRODESIS RIGHT MIDFOOT OF THE FIRST -  THIRD TARSOMETATARSAL JOINTS ;  Surgeon: Wylene Simmer, MD;  [facility];  Service: Orthopedics;  Laterality: Right;  . GASTROCNEMIUS RECESSION Right 07/28/2013   Procedure: RIGHT GASTROCNEMIUS RECESSION;  Surgeon: Wylene Simmer, MD;  Location: [facility];  Service: Orthopedics;  Laterality: Right;  . JOINT REPLACEMENT  2012   lt total knee  . KNEE ARTHROSCOPY     bilateral  in 2010, has multiple scopes  . MEDIAL PARTIAL KNEE REPLACEMENT     right  . nerve blocks    . SPINAL FUSION  05-05-2013  . stress fracture     right 2-3 toe  . TOTAL KNEE ARTHROPLASTY  2008   partial rt   . TOTAL SHOULDER ARTHROPLASTY Left 03/24/2019   Procedure: TOTAL SHOULDER ARTHROPLASTY;  Surgeon: Justice Britain, MD;  Location: [facility];  Service: Orthopedics;  Laterality: Left;  135min     FAMILY HISTORY: Family History  Problem Relation Age of Onset  . Heart disease Mother   . Cancer Mother   . Hypertension Mother   . Depression Mother   . Heart disease Other   . Hypertension Maternal Grandmother   . Diabetes Maternal Grandmother   . Kidney disease Maternal Grandmother   . Rheum arthritis Maternal Grandmother   . Depression Maternal Grandmother   . Hypertension Paternal Grandmother   . Kidney disease Maternal Grandfather   . Depression Maternal  Grandfather   . Depression Brother     SOCIAL HISTORY:  Social History   Socioeconomic History  . Marital status: Married    Spouse name: Not on file  . Number of children: 3  . Years of education: Not on file  . Highest education level: Not on file  Occupational History    Comment: RN  Social Needs  . Financial resource strain: Not on file  . Food insecurity    Worry: Not on file    Inability: Not on file  . Transportation needs    Medical: Not on file    Non-medical: Not on file  Tobacco Use  . Smoking status: Never Smoker  . Smokeless tobacco: Never Used  Substance and Sexual Activity  . Alcohol use: Yes    Comment: socially--wine & beer  . Drug use: No  . Sexual activity: Not on file  Lifestyle  . Physical activity    Days per week: Not on file    Minutes per session: Not on file  . Stress: Not on file  Relationships  . Social Herbalist on phone: Not on file    Gets together: Not on file    Attends religious service: Not on file    Active member of club or organization: Not on file    Attends meetings of clubs or organizations: Not on file  Relationship status: Not on file  . Intimate partner violence    Fear of current or ex partner: Not on file    Emotionally abused: Not on file    Physically abused: Not on file    Forced sexual activity: Not on file  Other Topics Concern  . Not on file  Social History Narrative   Lives at home with   Caffeine use:      PHYSICAL EXAM    General: The patient is well-developed and well-nourished and in no acute distress.  The neck is mildly tender.  She has reduced range of motion at the shoulders..  Neurologic Exam  Mental status: The patient is alert and oriented x 3 at the time of the examination.   Motor:  Muscle bulk is normal.   Tone is normal. Strength is  5 / 5 in all 4 extremities except 4+/5 bilateral APB muscles  Sensory: There is a Tinel's sign at both wrists..  She has slight  allodynia over the thenar eminence on the left.  Coordination: Cerebellar testing reveals good finger-nose-finger and heel-to-shin bilaterally.  Gait and station: Station is normal.   Gait and the tandem gait are wide.  Romberg is negative.   Reflexes: Deep tendon reflexes are symmetric and normal bilaterally.          DIAGNOSTIC DATA (LABS, IMAGING, TESTING) - I reviewed patient records, labs, notes, testing and imaging myself where available.  Lab Results  Component Value Date   WBC 6.4 04/25/2019   HGB 12.6 04/25/2019   HCT 38.4 04/25/2019   MCV 87 04/25/2019   PLT 219 04/25/2019      Component Value Date/Time   NA 142 03/15/2019 1105   NA 140 03/19/2018 1110   K 3.8 03/15/2019 1105   CL 105 03/15/2019 1105   CO2 29 03/15/2019 1105   GLUCOSE 128 (H) 03/15/2019 1105   BUN 15 03/15/2019 1105   BUN 15 03/19/2018 1110   CREATININE 0.70 03/15/2019 1105   CALCIUM 8.9 03/15/2019 1105   PROT 6.4 03/19/2018 1110   ALBUMIN 4.3 03/19/2018 1110   AST 21 03/19/2018 1110   ALT 23 03/19/2018 1110   ALKPHOS 98 03/19/2018 1110   BILITOT 0.7 03/19/2018 1110   GFRNONAA >60 03/15/2019 1105   GFRAA >60 03/15/2019 1105      ASSESSMENT AND PLAN  Carpal tunnel syndrome on both sides - Plan: NCV with EMG(electromyography), AMB referral to orthopedics  Dysesthesia - Plan: NCV with EMG(electromyography)  Bilateral hand pain - Plan: AMB referral to orthopedics  Bilateral carpal tunnel syndrome  Multiple sclerosis ([place])  1.   Continue Tysabri.  Because she is now JCV antibody positive (titer was 0.9 so mild to moderate) I will increase the dosing interval to 6 weeks.  We will retest her in 4 to 6 months and if she has increased further above 1.5, we would need to consider a different medication. 2.  She will continue medications for her pain, myalgias, depression.  Lyrica and clonazepam were renewed.   3.  The numbness in the hands was evaluated with EMG/NCV.  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  The radiculopathy could be left over from her earlier disc degenerative changes (she has fusion C5-C7) I will refer her to an orthopedic hand surgeon for the median neuropathy at the wrist.   4.  Return in 6 months or sooner if there are new or worsening neurologic symptoms.  Jerricka Carvey A. [name], MD, PhD [id number], [id number] AM Certified in Neurology, Clinical Neurophysiology, Sleep Medicine, Pain Medicine and Neuroimaging  [facility] [address] [place], [place] [address] [phone]

## 2019-05-31 ENCOUNTER — Telehealth: Payer: Self-pay | Admitting: Neurology

## 2019-05-31 MED ORDER — VERAPAMIL HCL ER 180 MG PO TBCR: 180.0000 mg | EXTENDED_RELEASE_TABLET | Freq: Every day | ORAL | 1 refills | Status: DC

## 2019-05-31 MED ORDER — CYCLOBENZAPRINE HCL 10 MG PO TABS: ORAL_TABLET | ORAL | 1 refills | Status: DC

## 2019-05-31 NOTE — Telephone Encounter (Signed)
1) Medication(s) Requested (by name): Verapamil Flexeril clonazepam 2) Pharmacy of Choice: [facility] [id number] [place], [place] 3) Special Requests:   Please resend for patient. Pharmacy is stating they have not received them. Please follow up.

## 2019-05-31 NOTE — Telephone Encounter (Signed)
After review of the chart the refill of the verapamil/Flexeril is appropriate and I have sent electronically. Dr. [name] sent the clonazepam rx on 05/24/2019.  I contacted Katie at [facility] and she confirmed she had the refill of clonazepam but needed the others. I advised refills should be coming across soon for processing.

## 2019-06-10 ENCOUNTER — Encounter: Payer: 59 | Admitting: Physical Medicine & Rehabilitation

## 2019-06-24 ENCOUNTER — Other Ambulatory Visit: Payer: Self-pay

## 2019-06-24 ENCOUNTER — Encounter: Payer: 59 | Attending: Physical Medicine & Rehabilitation | Admitting: Physical Medicine & Rehabilitation

## 2019-06-24 ENCOUNTER — Encounter: Payer: Self-pay | Admitting: Physical Medicine & Rehabilitation

## 2019-06-24 VITALS — BP 132/83 | HR 72 | Temp 97.7°F | Ht 68.5 in | Wt 274.0 lb

## 2019-06-24 DIAGNOSIS — M7072 Other bursitis of hip, left hip: Secondary | ICD-10-CM | POA: Insufficient documentation

## 2019-06-24 DIAGNOSIS — M533 Sacrococcygeal disorders, not elsewhere classified: Secondary | ICD-10-CM | POA: Diagnosis not present

## 2019-06-24 NOTE — Progress Notes (Signed)
Subjective:    Patient ID: Joan Ray, female    DOB: 1960/07/20, 58 y.o.   MRN: [id number] Duration of Web ex visit 12 minutes  HPI Left L5 Dorsal ramus injection, Left S1,2,3 ~6wks ago The pt had numbness for the first day , then the Left buttock started burning for a couple days which went away and now  Overall she feels her pain has been improved not only in the low back and buttock area but also on the left sits bone, i.e. ischial tuberosity Pain Inventory Average Pain 0 Pain Right Now 1 My pain is intermittent, sharp and aching  In the last 24 hours, has pain interfered with the following? General activity 1 Relation with others 1 Enjoyment of life 1 What TIME of day is your pain at its worst? daytime Sleep (in general) Good  Pain is worse with: some activites Pain improves with: rest Relief from Meds: 0  Mobility walk without assistance ability to climb steps?  yes do you drive?  yes  Function disabled: date disabled . I need assistance with the following:  bathing, meal prep and household duties  Neuro/Psych spasms  Prior Studies Any changes since last visit?  no  Physicians involved in your care Any changes since last visit?  no   Family History  Problem Relation Age of Onset  . Heart disease Mother   . Cancer Mother   . Hypertension Mother   . Depression Mother   . Heart disease Other   . Hypertension Maternal Grandmother   . Diabetes Maternal Grandmother   . Kidney disease Maternal Grandmother   . Rheum arthritis Maternal Grandmother   . Depression Maternal Grandmother   . Hypertension Paternal Grandmother   . Kidney disease Maternal Grandfather   . Depression Maternal Grandfather   . Depression Brother    Social History   Socioeconomic History  . Marital status: Married    Spouse name: Not on file  . Number of children: 3  . Years of education: Not on file  . Highest education level: Not on file  Occupational History    Comment:  RN  Tobacco Use  . Smoking status: Never Smoker  . Smokeless tobacco: Never Used  Substance and Sexual Activity  . Alcohol use: Yes    Comment: socially--wine & beer  . Drug use: No  . Sexual activity: Not on file  Other Topics Concern  . Not on file  Social History Narrative   Lives at home with   Caffeine use:    Social Determinants of Health   Financial Resource Strain:   . Difficulty of Paying Living Expenses: Not on file  Food Insecurity:   . Worried About Charity fundraiser in the Last Year: Not on file  . Ran Out of Food in the Last Year: Not on file  Transportation Needs:   . Lack of Transportation (Medical): Not on file  . Lack of Transportation (Non-Medical): Not on file  Physical Activity:   . Days of Exercise per Week: Not on file  . Minutes of Exercise per Session: Not on file  Stress:   . Feeling of Stress : Not on file  Social Connections:   . Frequency of Communication with Friends and Family: Not on file  . Frequency of Social Gatherings with Friends and Family: Not on file  . Attends Religious Services: Not on file  . Active Member of Clubs or Organizations: Not on file  . Attends Club or  Organization Meetings: Not on file  . Marital Status: Not on file   Past Surgical History:  Procedure Laterality Date  . ANTERIOR CERVICAL DECOMP/DISCECTOMY FUSION N/A 12/21/2014   Procedure: ANTERIOR CERVICAL DECOMPRESSION/DISCECTOMY FUSION 2 LEVELS C5-7;  Surgeon: Melina Schools, MD;  Location: [facility];  Service: Orthopedics;  Laterality: N/A;  . BLADDER SUSPENSION     occas  blood in urine  . ENDOMETRIAL ABLATION  2010  . FOOT ARTHRODESIS Right 07/28/2013   Procedure: ARTHRODESIS RIGHT MIDFOOT OF THE FIRST -  THIRD TARSOMETATARSAL JOINTS ;  Surgeon: Wylene Simmer, MD;  Location: [facility];  Service: Orthopedics;  Laterality: Right;  . GASTROCNEMIUS RECESSION Right 07/28/2013   Procedure: RIGHT GASTROCNEMIUS RECESSION;  Surgeon: Wylene Simmer, MD;  Location:  [facility];  Service: Orthopedics;  Laterality: Right;  . JOINT REPLACEMENT  2012   lt total knee  . KNEE ARTHROSCOPY     bilateral  in 2010, has multiple scopes  . MEDIAL PARTIAL KNEE REPLACEMENT     right  . nerve blocks    . SPINAL FUSION  05-05-2013  . stress fracture     right 2-3 toe  . TOTAL KNEE ARTHROPLASTY  2008   partial rt   . TOTAL SHOULDER ARTHROPLASTY Left 03/24/2019   Procedure: TOTAL SHOULDER ARTHROPLASTY;  Surgeon: Justice Britain, MD;  Location: [facility];  Service: Orthopedics;  Laterality: Left;  116min    Past Medical History:  Diagnosis Date  . Anemia    h/o of   Last infusion of iron  2008  . Anxiety   . Arthritis    osteo   . Complication of anesthesia   . Constipation   . Depression    d/t decreased activity  . GERD (gastroesophageal reflux disease)   . Headache(784.0)    migraines  . High triglycerides   . MS (multiple sclerosis) ([place])   . Neuromuscular disorder ([place])    mutliple sclerosis--dx 10/2008  . PONV (postoperative nausea and vomiting)   . Restless leg syndrome   . Wears glasses   . Wears glasses    LMP 04/27/2009   Opioid Risk Score:   Fall Risk Score:  `1  Depression screen PHQ 2/9  Depression screen PHQ 2/9 03/10/2019  Decreased Interest 0  Down, Depressed, Hopeless 1  PHQ - 2 Score 1  Altered sleeping 2  Tired, decreased energy 0  Change in appetite 0  Feeling bad or failure about yourself  0  Trouble concentrating 0  Moving slowly or fidgety/restless 0  Suicidal thoughts 0  PHQ-9 Score 3  Difficult doing work/chores Somewhat difficult  \ Review of Systems     Objective:   Physical Exam Neurological:     Mental Status: She is alert.  Psychiatric:        Mood and Affect: Mood normal.        Behavior: Behavior normal.        Thought Content: Thought content normal.        Judgment: Judgment normal.   WebEx visit performed however her video was not working therefore examination was very  limited        Assessment & Plan:  #1.  Left sacroiliac disorder patient had good results with left L5 dorsal ramus left S1 is to S3 lateral branch blocks under fluoroscopic guidance.  Not only did her pain in the buttock and low back area improved, she had improvement in her sitting tolerance over the left ischial tuberosity She has  a prolonged response to the nerve blocks.  We discussed that this is not typical but may occasionally occur.  Would hold off on radiofrequency until pain recurs and becomes continuous.  Patient will call office to schedule for left L5 dorsal ramus left S1-S2-S3 coolief RF

## 2019-07-04 DIAGNOSIS — Z0271 Encounter for disability determination: Secondary | ICD-10-CM

## 2019-07-13 ENCOUNTER — Other Ambulatory Visit: Payer: Self-pay | Admitting: *Deleted

## 2019-07-13 MED ORDER — CELEBREX 200 MG PO CAPS: 200.0000 mg | ORAL_CAPSULE | Freq: Two times a day (BID) | ORAL | 0 refills | Status: DC

## 2019-07-14 ENCOUNTER — Emergency Department
Admission: EM | Admit: 2019-07-14 | Discharge: 2019-07-14 | Disposition: A | Discharge status: Home / Self Care | Payer: 59 | Source: Home / Self Care | Attending: Family Medicine | Admitting: Family Medicine

## 2019-07-14 ENCOUNTER — Encounter: Payer: Self-pay | Admitting: Emergency Medicine

## 2019-07-14 ENCOUNTER — Other Ambulatory Visit: Payer: Self-pay

## 2019-07-14 DIAGNOSIS — Z20822 Contact with and (suspected) exposure to covid-19: Secondary | ICD-10-CM

## 2019-07-14 NOTE — ED Triage Notes (Signed)
Exposure to Covid

## 2019-07-14 NOTE — ED Provider Notes (Signed)
[name]    CSN: [id number] Arrival date & time: 07/14/19  0820      History   Chief Complaint Chief Complaint  Patient presents with  . Exposure to Covid    HPI Joan Ray Joan Ray is a 59 y.o. female.   Patient presents for COVID19 testing.  She is completely assymptomatic at present.     Past Medical History:  Diagnosis Date  . Anemia    h/o of   Last infusion of iron  2008  . Anxiety   . Arthritis    osteo   . Complication of anesthesia   . Constipation   . Depression    d/t decreased activity  . GERD (gastroesophageal reflux disease)   . Headache(784.0)    migraines  . High triglycerides   . MS (multiple sclerosis) ([place])   . Neuromuscular disorder ([place])    mutliple sclerosis--dx 10/2008  . PONV (postoperative nausea and vomiting)   . Restless leg syndrome   . Wears glasses   . Wears glasses     Patient Active Problem List   Diagnosis Date Noted  . Bilateral hand pain 05/24/2019  . Carpal tunnel syndrome on both sides 05/24/2019  . Status post total shoulder arthroplasty, left 03/24/2019  . Vision disturbance 03/19/2018  . Body mass index (BMI) of 40.0-44.9 in adult ([place]) 05/16/2015  . Morbid obesity ([place]) 05/16/2015  . Multiple sclerosis ([place]) 05/15/2015  . Dysesthesia 05/15/2015  . Depression with anxiety 05/15/2015  . Other fatigue 05/15/2015  . Insomnia 05/15/2015  . History of optic neuritis 05/15/2015  . Common migraine without intractability 05/15/2015  . Gait disturbance 05/15/2015  . Restless leg syndrome 05/15/2015  . Neck pain 12/21/2014  . Class 2 obesity 11/04/2014  . Cystocele, midline 08/02/2014  . 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 Major depressive disorder, single episode, severe without psychotic features ([place]) 04/19/2014  . Lipoma of back 01/23/2014  . 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 Thoracic and lumbosacral neuritis 01/05/2013  . Abnormal finding on mammography  09/19/2012  . Adult BMI 30+ 06/13/2012  . Edema extremities 06/13/2012  . Abnormal glucose level 06/13/2012  . Acid reflux 06/11/2012  . Arthritis, degenerative 06/11/2012  . Atypical chest pain 03/30/2012  . HLD (hyperlipidemia) 03/30/2012  . 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 ANTERIOR CERVICAL DECOMP/DISCECTOMY FUSION N/A 12/21/2014   Procedure: ANTERIOR CERVICAL DECOMPRESSION/DISCECTOMY FUSION 2 LEVELS C5-7;  Surgeon: Melina Schools, MD;  Location: [facility];  Service: Orthopedics;  Laterality: N/A;  . BLADDER SUSPENSION     occas  blood in urine  . CARPAL TUNNEL RELEASE    . ENDOMETRIAL ABLATION  2010  . FOOT ARTHRODESIS Right 07/28/2013   Procedure: ARTHRODESIS RIGHT MIDFOOT OF THE FIRST -  THIRD TARSOMETATARSAL JOINTS ;  Surgeon: Wylene Simmer, MD;  Location: [facility];  Service: Orthopedics;  Laterality: Right;  . GASTROCNEMIUS RECESSION Right 07/28/2013   Procedure: RIGHT GASTROCNEMIUS RECESSION;  Surgeon: Wylene Simmer, MD;  Location: [facility];  Service: Orthopedics;  Laterality: Right;  . JOINT REPLACEMENT  2012   lt total knee  . KNEE ARTHROSCOPY     bilateral  in 2010, has multiple scopes  . MEDIAL PARTIAL KNEE REPLACEMENT     right  . nerve blocks    . SPINAL FUSION  05-05-2013  . stress fracture     right 2-3 toe  . TOTAL KNEE  ARTHROPLASTY  2008   partial rt   . TOTAL SHOULDER ARTHROPLASTY Left 03/24/2019   Procedure: TOTAL SHOULDER ARTHROPLASTY;  Surgeon: Justice Britain, MD;  [facility];  Service: Orthopedics;  Laterality: Left;  166min     OB History   No obstetric history on file.      Home Medications    Prior to Admission medications   Medication Sig Start Date End Date Taking? Authorizing Provider  acetaminophen (TYLENOL) 650 MG CR tablet Take 1,300 mg by mouth every 8 (eight) hours as needed for pain.    [provider]  CELEBREX 200 MG capsule Take 1 capsule  (200 mg total) by mouth 2 (two) times daily. 07/13/19   Suzzanne Cloud, NP  cetirizine (ZYRTEC) 10 MG tablet Take 10 mg by mouth daily.    [provider]  Cholecalciferol (VITAMIN D3) 125 MCG (5000 UT) CAPS Take 5,000 Units by mouth daily.     [provider]  clonazePAM (KLONOPIN) 0.5 MG tablet Take 1 tablet (0.5 mg total) by mouth 2 (two) times daily as needed. 05/24/19   Sater, Nanine Means, MD  cyclobenzaprine (FLEXERIL) 10 MG tablet take 1 tablet by mouth three times a day if needed 05/31/19   Sater, Nanine Means, MD  DULoxetine (CYMBALTA) 60 MG capsule Take 2 capsules (120 mg total) by mouth daily. 05/19/19   Sater, Nanine Means, MD  Esomeprazole Magnesium 20 MG TBEC Take 20 mg by mouth at bedtime.     [provider]  fenofibrate (TRICOR) 145 MG tablet Take 145 mg by mouth daily.    [provider]  fluticasone (FLONASE) 50 MCG/ACT nasal spray Place 2 sprays into both nostrils at bedtime.  07/06/18   [provider]  furosemide (LASIX) 20 MG tablet Take 20 mg by mouth daily as needed for edema.    [provider]  hydrochlorothiazide (MICROZIDE) 12.5 MG capsule Take 25 mg by mouth daily.     [provider]  HYDROmorphone (DILAUDID) 2 MG tablet Take 1 tablet (2 mg total) by mouth every 4 (four) hours as needed for severe pain. 03/25/19   Shuford, Olivia Mackie, PA-C  Krill Oil 500 MG CAPS Take 500 mg by mouth daily.    [provider]  lidocaine (LIDODERM) 5 % apply 3 patches to affected area daily as directed 06/10/16   Sater, Nanine Means, MD  Melatonin 10 MG CAPS Take 10 mg by mouth at bedtime.     [provider]  natalizumab (TYSABRI) 300 MG/15ML injection Inject 300 mg into the vein every 30 (thirty) days. Takes for MS    [provider]  Olopatadine HCl 0.6 % SOLN Place 2 sprays into the nose daily.    [provider]  ondansetron (ZOFRAN) 4 MG tablet Take 4 mg by mouth every 8 (eight) hours as needed for  nausea or vomiting.    [provider]  pregabalin (LYRICA) 150 MG capsule Take 1 capsule (150 mg total) by mouth 2 (two) times daily. 05/24/19   Sater, Nanine Means, MD  rizatriptan (MAXALT) 10 MG tablet Take 10 mg by mouth every 2 (two) hours as needed for migraine. May repeat in 2 hours if needed    [provider]  senna-docusate (SENOKOT-S) 8.6-50 MG tablet Take 1 tablet by mouth 2 (two) times daily.    [provider]  verapamil (CALAN-SR) 180 MG CR tablet Take 1 tablet (180 mg total) by mouth at bedtime. 05/31/19   Arlice Colt  A, MD  zaleplon (SONATA) 10 MG capsule Take 10 mg by mouth at bedtime.    [provider]    Family History Family History  Problem Relation Age of Onset  . Heart disease Mother   . Cancer Mother   . Hypertension Mother   . Depression Mother   . Heart disease Other   . Hypertension Maternal Grandmother   . Diabetes Maternal Grandmother   . Kidney disease Maternal Grandmother   . Rheum arthritis Maternal Grandmother   . Depression Maternal Grandmother   . Hypertension Paternal Grandmother   . Kidney disease Maternal Grandfather   . Depression Maternal Grandfather   . Depression Brother     Social History Social History   Tobacco Use  . Smoking status: Never Smoker  . Smokeless tobacco: Never Used  Substance Use Topics  . Alcohol use: Yes    Comment: socially--wine & beer  . Drug use: No     Allergies   Bee venom, Hydrocodone-acetaminophen, Progesterone, Sertraline hcl, Statins, Trazodone, Betadine [povidone iodine], Codeine, Darvocet [propoxyphene n-acetaminophen], Diazepam, Dilaudid [hydromorphone hcl], Doxycycline, Keppra [levetiracetam], Lamictal [lamotrigine], Lipitor [atorvastatin], Lyrica [pregabalin], Neurontin [gabapentin], Other, Oxycodone hcl, Pravastatin, Sertraline, Topamax [topiramate], Tramadol, Zonisamide, Cephalosporins, Erythromycin, Keflex [cephalexin], and Vancomycin   Review of  Systems Review of Systems No sore throat No cough No pleuritic pain No wheezing No nasal congestion No post-nasal drainage No sinus pain/pressure No itchy/red eyes No earache No hemoptysis No SOB No fever/chills No nausea No vomiting No abdominal pain No diarrhea No urinary symptoms No skin rash No fatigue No myalgias No headache   Physical Exam Triage Vital Signs ED Triage Vitals  Enc Vitals Group     BP 07/14/19 0848 114/75     Pulse Rate 07/14/19 0848 72     Resp --      Temp 07/14/19 0848 98.5 F (36.9 C)     Temp Source 07/14/19 0848 Oral     SpO2 07/14/19 0848 96 %     Weight 07/14/19 0849 272 lb (123.4 kg)     Height 07/14/19 0849 5\' 8"  (1.727 m)     Head Circumference --      Peak Flow --      Pain Score 07/14/19 0849 0     Pain Loc --      Pain Edu? --      Excl. in [place]? --    No data found.  Updated Vital Signs BP 114/75 (BP Location: Right Arm)   Pulse 72   Temp 98.5 F (36.9 C) (Oral)   Ht 5\' 8"  (1.727 m)   Wt 123.4 kg   LMP 04/27/2009   SpO2 96%   BMI 41.36 kg/m   Visual Acuity Right Eye Distance:   Left Eye Distance:   Bilateral Distance:    Right Eye Near:   Left Eye Near:    Bilateral Near:     Physical Exam Vitals and nursing note reviewed.  Constitutional:      General: She is not in acute distress. Neurological:     Mental Status: She is alert.   Patient not examined otherwise   UC Treatments / Results  Labs (all labs ordered are listed, but only abnormal results are displayed) Labs Reviewed  NOVEL CORONAVIRUS, NAA    EKG   Radiology No results found.  Procedures Procedures (including critical care time)  Medications Ordered in UC Medications - No data to display  Initial Impression / Assessment and Plan / UC Course  I have reviewed the triage vital signs and the nursing notes.  Pertinent labs & imaging results that were available during my care of the patient were reviewed by me and considered in my  medical decision making (see chart for details).    Patient assymptomatic at present  [place] send out   Final Clinical Impressions(s) / UC Diagnoses   Final diagnoses:  Exposure to COVID-19 virus     Discharge Instructions     Isolate yourself until COVID-19 test result is available.   If your COVID19 test is positive, then you are infected with the novel coronavirus and could give the virus to others.  Please continue isolation at home for at least 10 days since the start of your symptoms. If you do not have symptoms, please isolate at home for 10 days from the day you were tested. Once you complete your 10 day quarantine, you may return to normal activities as long as you've not had a fever for over 24 hours (without taking fever reducing medicine) and your symptoms are improving. Please continue good preventive care measures, including:  frequent hand-washing, avoid touching your face, cover coughs/sneezes, stay out of crowds and keep a 6 foot distance from others.  Go to the nearest hospital emergency room if fever/cough/breathlessness are severe or illness seems like a threat to life.     ED Prescriptions    None        Kandra Nicolas, MD 07/14/19 [phone]

## 2019-07-14 NOTE — Discharge Instructions (Addendum)
Isolate yourself until COVID-19 test result is available.   If your COVID19 test is positive, then you are infected with the novel coronavirus and could give the virus to others.  Please continue isolation at home for at least 10 days since the start of your symptoms. If you do not have symptoms, please isolate at home for 10 days from the day you were tested. Once you complete your 10 day quarantine, you may return to normal activities as long as you've not had a fever for over 24 hours (without taking fever reducing medicine) and your symptoms are improving. Please continue good preventive care measures, including:  frequent hand-washing, avoid touching your face, cover coughs/sneezes, stay out of crowds and keep a 6 foot distance from others.  Go to the nearest hospital emergency room if fever/cough/breathlessness are severe or illness seems like a threat to life.  

## 2019-07-16 LAB — NOVEL CORONAVIRUS, NAA: SARS-CoV-2, NAA: NOT DETECTED

## 2019-07-18 ENCOUNTER — Other Ambulatory Visit: Payer: Self-pay | Admitting: *Deleted

## 2019-07-18 ENCOUNTER — Encounter: Payer: Self-pay | Admitting: Neurology

## 2019-07-18 MED ORDER — CELEBREX 200 MG PO CAPS: 200.0000 mg | ORAL_CAPSULE | Freq: Two times a day (BID) | ORAL | 0 refills | Status: DC

## 2019-07-20 ENCOUNTER — Other Ambulatory Visit: Payer: Self-pay | Admitting: *Deleted

## 2019-07-20 MED ORDER — PREGABALIN 150 MG PO CAPS: 150.0000 mg | ORAL_CAPSULE | Freq: Two times a day (BID) | ORAL | 3 refills | Status: DC

## 2019-07-21 ENCOUNTER — Telehealth: Payer: Self-pay | Admitting: *Deleted

## 2019-07-21 NOTE — Telephone Encounter (Signed)
Faxed completed/signed Tysabri pt status report and reauth questionnaire to [facility] at [phone]. Received confirmation.  

## 2019-07-25 NOTE — Telephone Encounter (Signed)
Received fax back from [facility] prescribing program that pt re-authorized from 07/22/2019-01/29/2020. Pt enrollment # [id number]. Account: [id number]. Site auth # [id number].

## 2019-09-13 ENCOUNTER — Other Ambulatory Visit: Payer: Self-pay | Admitting: *Deleted

## 2019-09-13 MED ORDER — CLONAZEPAM 0.5 MG PO TABS: 0.5000 mg | ORAL_TABLET | Freq: Two times a day (BID) | ORAL | 0 refills | Status: DC | PRN

## 2019-09-13 MED ORDER — CYCLOBENZAPRINE HCL 10 MG PO TABS: ORAL_TABLET | ORAL | 0 refills | Status: DC

## 2019-09-13 MED ORDER — VERAPAMIL HCL ER 180 MG PO TBCR: 180.0000 mg | EXTENDED_RELEASE_TABLET | Freq: Every day | ORAL | 0 refills | Status: DC

## 2019-10-25 ENCOUNTER — Other Ambulatory Visit: Payer: Self-pay

## 2019-10-25 ENCOUNTER — Telehealth: Payer: Self-pay | Admitting: *Deleted

## 2019-10-25 ENCOUNTER — Ambulatory Visit: Payer: 59 | Admitting: Neurology

## 2019-10-25 ENCOUNTER — Encounter: Payer: Self-pay | Admitting: Neurology

## 2019-10-25 VITALS — BP 149/84 | HR 79 | Temp 97.6°F | Ht 68.0 in | Wt 281.0 lb

## 2019-10-25 DIAGNOSIS — M2550 Pain in unspecified joint: Secondary | ICD-10-CM

## 2019-10-25 DIAGNOSIS — M5414 Radiculopathy, thoracic region: Secondary | ICD-10-CM

## 2019-10-25 DIAGNOSIS — R208 Other disturbances of skin sensation: Secondary | ICD-10-CM

## 2019-10-25 DIAGNOSIS — Z79899 Other long term (current) drug therapy: Secondary | ICD-10-CM

## 2019-10-25 DIAGNOSIS — G4700 Insomnia, unspecified: Secondary | ICD-10-CM

## 2019-10-25 DIAGNOSIS — G35 Multiple sclerosis: Secondary | ICD-10-CM

## 2019-10-25 DIAGNOSIS — M5417 Radiculopathy, lumbosacral region: Secondary | ICD-10-CM

## 2019-10-25 MED ORDER — OXCARBAZEPINE 150 MG PO TABS: ORAL_TABLET | ORAL | 5 refills | Status: DC

## 2019-10-25 NOTE — Progress Notes (Signed)
[facility]  PATIENT: Joan Ray DOB: 1961-02-12  REFERRING DOCTOR OR PCP:  Melina Schools [phone]) Also Joan Ray (PCP) SOURCE: Patient, MRI images on PACS and CD and notes from PCP and ortho  _________________________________   HISTORICAL  CHIEF COMPLAINT:  Chief Complaint  Patient presents with  . Follow-up    RM 12, alone. Here to f/u on ting/burning in feet/arms. Has intermittent dizziness when she stands, painful joints, hand tightness. Needs refill on maxalt.     HISTORY OF PRESENT ILLNESS:  Joan Ray is a 59 y.o. right-handed patient with relapsing remitting MS diagnosed in 2010.     Update 10/25/2019 For MS, she is on Tysabri and tolerates it well.   She has a positive JCV Ab increasing to 0.9 when last checked.   We increased the interval to q6 weeks.   The only other DMT she has bene on is Avonex.    She continues to note a lot of tight myalgias in her hands, aching myalgia in her legs and a frostbite sensation in her feet.  She notes her leg pain varies some day to day.  She notes that when she first stands up she is stooped over but over time she can stand up straighter.   She is on Lyrica 150 mg bid and duloxetine 120 mg.  Lamotrigine was not well tolerated (tongue burning).   She has not been on trileptal or tegretol.  A steroid dose pack helped her pain and swelling in her hands.    MRI cervical spine 05/13/2019 shows C5-C7 ACDF and DJD at other levels, worse at C7T1 with right foraminal narrowing.     She is overweight.   Phentermine and Saxenda did not help her lose weight.    Update 05/24/2019:  At her last visit on 04/25/2019, we checked JCV antibody.  It was positive at 0.9 and her previous reading was negative at 0.21.  We discussed that we might be able to lower the risk of JCV by extending the interval between Tysabri doses.  However, if her titer rises much further we will need to convert to a different medication for  the MS.  At the last visit she was complaining of left greater than right hand pain and numbness.  A nerve conduction and EMG study today showed bilateral carpal tunnel syndrome, worse on the left and a mild left chronic C7 radiculopathy without any active features.  We discussed referring her to a hand surgeon as she has noted increasing pain over the past couple months.  Additionally, the carpal tunnel syndrome on the left has worsened compared to the 2018 NCV/EMG study  Update 04/25/2019: She is on Tysabri and she tolerates it well.  She has not had any exacerbations.  Her last JCV antibody titer was 0.21 and the inhibition assay was negative.   Mri brain 10/8/02020 had shown no new lesions.    Her gait is about the same.  Legs are strong.   She has recent shoulder surgery and is doing PT.  Hand strength seems worse on the left and reduced ROM in shoulders.   She has dysesthetic pain in her hands, worse on left since shoulder surgery.    Lyrica has not helped as much as it did initially, even on 150 mg twice a day.  Additionally, she is on duloxetine 120 mg that may help her dysesthetic pain.  NCV/EMG showed mild bilateral CTS in 2018.   She has mild  urge incontinence.   Vision is blurry and she has difficulty using a computer for long times.  She has seen ophthalmology and no ocular issues (just refraction).    She notes fatigue that has worsened this year.   She does best in the mid day.  She has some depression and anxiety, helped by Cymbalta.  Cognition is doing fairly well.  Headaches are better on amlodipine.   She also has myalgias and joint pain especially knees.   She is on Cymbalta, Lyrica and Celebrex.      She has pain in the left leg, worse with certain positions.   This has been present a long time.    Due to reduced arm strength, fatigue and visual problems may not be able to go back to work.  Update 09/24/2018 (with Butler Denmark, NP):  Update 03/19/2018: She is on Tysabri and she  tolerates it well.  She has not had any exacerbations.  Her last JCV antibody titer was 0.23 and the inhibition assay was negative.  Her gait is usually ok but she has one bad fall and tore her hamstring.   An injection helped for a while.  She denies much weakness in the legs but she does note some weakness in the hands at times.  She has dysesthetic pain in her legs and hands.    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  Additionally, she is on duloxetine 120 mg that may help her dysesthetic pain.  She has mild urge incontinence.   Vision is blurry when she uses eyes a lt like at work with computer screen.   There is a gray halo around some objects and VF sometimes seems off.  She has seen ophthalmology and she states they recommended another MRI.    She notes fatigue.  She has some depression and anxiety, helped by Cymbalta.  Cognition is doing fairly well.  She also has rare migraine headaches and other more frequent milder headaches  She has done better on amlodipine.   She also has myalgias and joint pain especially knees.   She is on Cymbalta, Lyrica and Celebrex.    She feels symptoms were much better on brand name Cymbalta.     She is doing aquatic therapy and trying a diet and has lost 9 pounds.        Update 09/10/2017: She feels her MS is stable.    She is on Tysabri 300 mg q 4 weeks and tolerates it well.   Her last JCV Ab was negative at 0.26.    Gait and leg strength are fine for the most part.  She is experiencing more dysesthetic pain as well as joint pain. She has some wrist / hand weakness at times.    .   She notes foot pain is doing worse.   She is on Lyrica.    She tolerates 100 mg bid but 300 mg causes cognitive slowing.    She is also on Duloxetine 120 mg.      per about her function is about the same. She notes some fatigue mood has been better on Cymbalta. The brand works  better than the generic but insurance would not cover it. Cognition is fine.  Celebrex helps her arthritic pain but not the nerve pain.    She does better with the brand  than the generic.       She has migraines.  Generic amlodipine did not help as much as the brand but brand is very expensive.   Calan SR 180 mg is not available currently.       She has some weight loss on phentermine plus metformin but not much.   She is not a candidate for gastric bypass due to gastric polyps.      From 03/12/2017: MS:   She is on Tysabri and tolerates it well..  She has been JCV Ab negative.  MRI 09/19/2015 showed no definite new lesions compared to 07/02/2010.  Migraine:  She reports migraines did better on brand name Norvasc than on amlodipine.      Neck/back pain/CTS:   She also has neck pain and back pain.  She also has carpal tunnel syndrome (on EMG by Dr. Starleen Blue but Dr. Nelva Bush felt due to shoulder impingement (by her report) Also more knee pain.     Insurance won't cover name brand Celebrex.  Other:   She is undergoing evaluation for bariatric surgery but stomach polyps were noted so this is on hold.  She is exercising and taking phentermine but weight is not improving.    Gait/strength: Gait is stable. She occasionally stumbles but has no falls..  No weakness and no numbness in legs.    Bladder: She has urinary frequency and urgency. This is mild.   No incontinence.     Vision: She had a good recovery from the optic neuritis. She denies any significant problems with her vision now. She does wear glasses. There'll be some fluctuations in her vision at times.  Fatigue/sleep: She has fatigue and also notes excessive sweating.    She has insomnia, but with melatonin and clonazepam (often needs 0.5 mg x 2) she falls asleep easily but wakes up and then can't fall back asleep.    RLS symptoms are much better on clonazepam and Lyrica. A sleep study in the past did not show sleep apnea but did show periodic limb  movements of sleep.    GERD is a problem at night.    Mood/cognition: She has mild depression and anxiety and has been helped by Cymbalta.   She was switched to generic Cymbalta but it has not helped as much as the brand name.  . She notes mild cognitive issues.    Shehas poor attention and is easily distracted.    She has mild difficulty with executive function.    She has not had formal neurocognitive testing.    Migraine:   She has had migraine headaches and was on Norvasc with benefit However, it was switched to generic and migraines worsened.     Spine/limb pain history:    She reports having  L5-S1 fusion in October 2014 and C5-C7 fusion June 2016.   Although some symptoms improved after surgery, she continues to have a lot of difficulties with neck pain, arm pain, back pain and leg pain.    She was placed on Lyrica for her pain and was titrated to 200 mg twice a day but could not tolerate that dose. She has since been reduced to 100 mg by mouth twice a day and tolerates that dose but continues to have pain.      She reports some axial neck and lower back pain but the dysesthetic pain in the hands and feet is more troublesome.    Both hands have the dysesthetic pain but in the legs, the  right foot is much worse than the left.   She gets some benefit from Lyrica.   She felt her pain had been better on Cymbalta than on Effexor. However, in 2013 when she was having more neurologic issues the Cymbalta was discontinued.   In the right foot, the pain is most intense on the top of the foot and towards the ankle but not above the ankle.    I personally reviewed the MRI of the cervical spine from 10/07/2014.   It shows degenerative changes at C5-C6 resulting in left foraminal narrowing and changes at C6-C7 resulting right foraminal narrowing. The spinal cord appears normal. Plain films done after surgery showed a C5-C7 fusion and lumbar plain films showed the L5-S1 fusion.  MS History:   In April 2010, she  presented with optic neuritis in the left eye. She also had pain in the left eye. She went to an ophthalmologist who did some tests and referred her or an MRI. The MRI was abnormal, consistent with MS and she was referred to Dr. Maye Hides.   Usually, she was placed on weekly Avonex injections. She did not like the way that she felt on the injections. Additionally, an MRI of the brain showed that she had 2 new lesions. Therefore, she was switched to Tysabri in 2011.     On Tysabri, she has had no definite exacerbations, no change in MRI. Additionally she tolerates the medication very well. She has had some low positive JCV antibody titers. We do not have the actual numbers.    She switched from Dr. Starleen Blue to Dr. William Hamburger in [place] in 2013. She is interested in changing to an [place].    Last MRI was early 2016 at [facility] in [place].     REVIEW OF SYSTEMS: Constitutional: No fevers, chills, sweats, or change in appetite.    She has insomnia and headaches and restless leg syndrome she reports a lot of fatigue. Eyes: No visual changes, double vision, eye pain Ear, nose and throat: No hearing loss, ear pain, nasal congestion, sore throat Cardiovascular: No chest pain, palpitations Respiratory: No shortness of breath at rest or with exertion.   No wheezes GastrointestinaI:  She has constipation.   No nausea, vomiting, diarrhea, abdominal pain, fecal incontinence Genitourinary: No dysuria, urinary retention or frequency.  No nocturia. Musculoskeletal: as above Integumentary: No rash, skin lesions.   Some prurutis Neurological: as above Psychiatric: Notes depression > anxiety Endocrine: No palpitations, diaphoresis, change in appetite, change in weigh or increased thirst Hematologic/Lymphatic: No anemia, purpura, petechiae. Allergic/Immunologic: No itchy/runny eyes, nasal congestion, recent allergic reactions, rashes  ALLERGIES: Allergies  Allergen Reactions  .  Bee Venom Swelling  . Hydrocodone-Acetaminophen Itching  . Progesterone Other (See Comments) and Rash    increase/profuse sweating-flushing-  . Sertraline Hcl Other (See Comments)  . Statins Swelling    muscle and joint pain/tightness  . Trazodone Other (See Comments)    dizziness  . Betadine [Povidone Iodine]     Blisters  . Codeine Itching    Rash  . Darvocet [Propoxyphene N-Acetaminophen] Nausea And Vomiting  . Diazepam     Increases agitation  . Dilaudid [Hydromorphone Hcl] Itching  . Doxycycline     Facial flushing  . Keppra [Levetiracetam]     Shaking, nervousness, palpations and flushing  . Lamictal [Lamotrigine]     Causes tongue and mouth to tingle  . Lipitor [Atorvastatin]     Severe muscle and joint pain / ms  relapse  . Lyrica [Pregabalin] Other (See Comments)    Doses over 300mg  decrease memory  . Neurontin [Gabapentin]     Impaired cognition, slower motor skills (intolerant)  . Other     EDTA causes blisters  . Oxycodone Hcl Itching  . Pravastatin Other (See Comments)    Severe joint pain MS relapse  . Sertraline Other (See Comments)    Increased nervousness  . Topamax [Topiramate]     Confusion  . Tramadol Other (See Comments)    "Nervous, jittery, felt weird, nausea"  . Zonisamide     Back pain, headaches, insomnia, chest pressure  . Cephalosporins Rash  . Erythromycin Itching and Rash  . Keflex [Cephalexin] Rash  . Vancomycin Rash    Pt stated, " I can't tolerate Vancomycin I get a red rash."    HOME MEDICATIONS:  Current Outpatient Medications:  .  acetaminophen (TYLENOL) 650 MG CR tablet, Take 1,300 mg by mouth every 8 (eight) hours as needed for pain., Disp: , Rfl:  .  CELEBREX 200 MG capsule, Take 1 capsule (200 mg total) by mouth 2 (two) times daily., Disp: 180 capsule, Rfl: 0 .  cetirizine (ZYRTEC) 10 MG tablet, Take 10 mg by mouth daily., Disp: , Rfl:  .  Cholecalciferol (VITAMIN D3) 125 MCG (5000 UT) CAPS, Take 5,000 Units by mouth daily.  , Disp: , Rfl:  .  clonazePAM (KLONOPIN) 0.5 MG tablet, Take 1 tablet (0.5 mg total) by mouth 2 (two) times daily as needed., Disp: 180 tablet, Rfl: 0 .  cyclobenzaprine (FLEXERIL) 10 MG tablet, take 1 tablet by mouth three times a day if needed, Disp: 270 tablet, Rfl: 0 .  DULoxetine (CYMBALTA) 60 MG capsule, Take 2 capsules (120 mg total) by mouth daily., Disp: 180 capsule, Rfl: 1 .  Esomeprazole Magnesium 20 MG TBEC, Take 20 mg by mouth at bedtime. , Disp: , Rfl:  .  fenofibrate (TRICOR) 145 MG tablet, Take 145 mg by mouth daily., Disp: , Rfl:  .  fluticasone (FLONASE) 50 MCG/ACT nasal spray, Place 2 sprays into both nostrils at bedtime. , Disp: , Rfl:  .  hydrochlorothiazide (HYDRODIURIL) 25 MG tablet, Take 25 mg by mouth daily., Disp: , Rfl:  .  Krill Oil 500 MG CAPS, Take 2,000 mg by mouth daily. , Disp: , Rfl:  .  Melatonin 10 MG CAPS, Take 10 mg by mouth at bedtime. , Disp: , Rfl:  .  natalizumab (TYSABRI) 300 MG/15ML injection, Inject 300 mg into the vein every 30 (thirty) days. Takes for MS, Disp: , Rfl:  .  Olopatadine HCl 0.6 % SOLN, Place 2 sprays into the nose daily., Disp: , Rfl:  .  ondansetron (ZOFRAN) 4 MG tablet, Take 4 mg by mouth every 8 (eight) hours as needed for nausea or vomiting., Disp: , Rfl:  .  pregabalin (LYRICA) 150 MG capsule, Take 1 capsule (150 mg total) by mouth 2 (two) times daily., Disp: 180 capsule, Rfl: 3 .  rizatriptan (MAXALT) 10 MG tablet, Take 10 mg by mouth every 2 (two) hours as needed for migraine. May repeat in 2 hours if needed, Disp: , Rfl:  .  senna-docusate (SENOKOT-S) 8.6-50 MG tablet, Take 1 tablet by mouth 2 (two) times daily., Disp: , Rfl:  .  verapamil (CALAN-SR) 180 MG CR tablet, Take 1 tablet (180 mg total) by mouth at bedtime., Disp: 90 tablet, Rfl: 0 .  zaleplon (SONATA) 10 MG capsule, Take 10 mg by mouth at bedtime., Disp: , Rfl:  .  OXcarbazepine (TRILEPTAL) 150 MG tablet, Take one po qAM and two po qHS, Disp: 90 tablet, Rfl: 5 No  current facility-administered medications for this visit.  Facility-Administered Medications Ordered in Other Visits:  .  gadopentetate dimeglumine (MAGNEVIST) injection 20 mL, 20 mL, Intravenous, Once PRN, Tadao Emig, Nanine Means, MD  PAST MEDICAL HISTORY: Past Medical History:  Diagnosis Date  . Anemia    h/o of   Last infusion of iron  2008  . Anxiety   . Arthritis    osteo   . Complication of anesthesia   . Constipation   . Depression    d/t decreased activity  . GERD (gastroesophageal reflux disease)   . Headache(784.0)    migraines  . High triglycerides   . MS (multiple sclerosis) ([place])   . Neuromuscular disorder ([place])    mutliple sclerosis--dx 10/2008  . PONV (postoperative nausea and vomiting)   . Restless leg syndrome   . Wears glasses   . Wears glasses     PAST SURGICAL HISTORY: Past Surgical History:  Procedure Laterality Date  . ANTERIOR CERVICAL DECOMP/DISCECTOMY FUSION N/A 12/21/2014   Procedure: ANTERIOR CERVICAL DECOMPRESSION/DISCECTOMY FUSION 2 LEVELS C5-7;  Surgeon: Melina Schools, MD;  Location: [facility];  Service: Orthopedics;  Laterality: N/A;  . BLADDER SUSPENSION     occas  blood in urine  . CARPAL TUNNEL RELEASE    . ENDOMETRIAL ABLATION  2010  . FOOT ARTHRODESIS Right 07/28/2013   Procedure: ARTHRODESIS RIGHT MIDFOOT OF THE FIRST -  THIRD TARSOMETATARSAL JOINTS ;  Surgeon: Wylene Simmer, MD;  Location: [facility];  Service: Orthopedics;  Laterality: Right;  . GASTROCNEMIUS RECESSION Right 07/28/2013   Procedure: RIGHT GASTROCNEMIUS RECESSION;  Surgeon: Wylene Simmer, MD;  Location: [facility];  Service: Orthopedics;  Laterality: Right;  . JOINT REPLACEMENT  2012   lt total knee  . KNEE ARTHROSCOPY     bilateral  in 2010, has multiple scopes  . MEDIAL PARTIAL KNEE REPLACEMENT     right  . nerve blocks    . SPINAL FUSION  05-05-2013  . stress fracture     right 2-3 toe  . TOTAL KNEE ARTHROPLASTY  2008   partial rt   . TOTAL  SHOULDER ARTHROPLASTY Left 03/24/2019   Procedure: TOTAL SHOULDER ARTHROPLASTY;  Surgeon: Justice Britain, MD;  Location: [facility];  Service: Orthopedics;  Laterality: Left;  164min     FAMILY HISTORY: Family History  Problem Relation Age of Onset  . Heart disease Mother   . Cancer Mother   . Hypertension Mother   . Depression Mother   . Heart disease Other   . Hypertension Maternal Grandmother   . Diabetes Maternal Grandmother   . Kidney disease Maternal Grandmother   . Rheum arthritis Maternal Grandmother   . Depression Maternal Grandmother   . Hypertension Paternal Grandmother   . Kidney disease Maternal Grandfather   . Depression Maternal Grandfather   . Depression Brother     SOCIAL HISTORY:  Social History   Socioeconomic History  . Marital status: Married    Spouse name: Not on file  . Number of children: 3  . Years of education: Not on file  . Highest education level: Not on file  Occupational History    Comment: RN  Tobacco Use  . Smoking status: Never Smoker  . Smokeless tobacco: Never Used  Substance and Sexual Activity  . Alcohol use: Yes    Comment: socially--wine & beer  . Drug use: No  .  Sexual activity: Not on file  Other Topics Concern  . Not on file  Social History Narrative   Lives at home with   Caffeine use:    Social Determinants of Health   Financial Resource Strain:   . Difficulty of Paying Living Expenses:   Food Insecurity:   . Worried About Charity fundraiser in the Last Year:   . Arboriculturist in the Last Year:   Transportation Needs:   . Film/video editor (Medical):   Marland Kitchen Lack of Transportation (Non-Medical):   Physical Activity:   . Days of Exercise per Week:   . Minutes of Exercise per Session:   Stress:   . Feeling of Stress :   Social Connections:   . Frequency of Communication with Friends and Family:   . Frequency of Social Gatherings with Friends and Family:   . Attends Religious Services:   . Active Member of  Clubs or Organizations:   . Attends Archivist Meetings:   Marland Kitchen Marital Status:   Intimate Partner Violence:   . Fear of Current or Ex-Partner:   . Emotionally Abused:   Marland Kitchen Physically Abused:   . Sexually Abused:      PHYSICAL EXAM    General: The patient is well-developed and well-nourished and in no acute distress.  The neck is mildly tender.  She has reduced range of motion at the shoulders.  She has some tenderness over proximal leg muscles, proximal arm muscles and chest and neck muscles  Neurologic Exam  Mental status: The patient is alert and oriented x 3 at the time of the examination.   Motor:  Muscle bulk is normal.   Tone is normal. Strength is  5 / 5 in all 4 extremities except 4+/5 bilateral APB muscles  Sensory: There is a Tinel's sign at both wrists..  She has slight allodynia over the thenar eminence on the left.  Coordination: Cerebellar testing reveals good finger-nose-finger and heel-to-shin bilaterally.  Gait and station: Station is normal.   Gait and the tandem gait are wide.  Romberg is negative.   Reflexes: Deep tendon reflexes are symmetric and normal bilaterally.          DIAGNOSTIC DATA (LABS, IMAGING, TESTING) - I reviewed patient records, labs, notes, testing and imaging myself where available.  Lab Results  Component Value Date   WBC 6.4 04/25/2019   HGB 12.6 04/25/2019   HCT 38.4 04/25/2019   MCV 87 04/25/2019   PLT 219 04/25/2019      Component Value Date/Time   NA 142 03/15/2019 1105   NA 140 03/19/2018 1110   K 3.8 03/15/2019 1105   CL 105 03/15/2019 1105   CO2 29 03/15/2019 1105   GLUCOSE 128 (H) 03/15/2019 1105   BUN 15 03/15/2019 1105   BUN 15 03/19/2018 1110   CREATININE 0.70 03/15/2019 1105   CALCIUM 8.9 03/15/2019 1105   PROT 6.4 03/19/2018 1110   ALBUMIN 4.3 03/19/2018 1110   AST 21 03/19/2018 1110   ALT 23 03/19/2018 1110   ALKPHOS 98 03/19/2018 1110   BILITOT 0.7 03/19/2018 1110   GFRNONAA >60 03/15/2019  1105   GFRAA >60 03/15/2019 1105      ASSESSMENT AND PLAN  Multiple sclerosis ([place]) - Plan: Stratify JCV Antibody Test ([facility]), CBC with Differential/Platelet, Comprehensive metabolic panel  Dysesthesia  Thoracic and lumbosacral neuritis  Insomnia, unspecified type  Multiple joint pain - Plan: Rheumatoid factor, Uric Acid, Sedimentation rate, C-reactive protein, ANA  w/Reflex  High risk medication use - Plan: Stratify JCV Antibody Test ([facility]), CBC with Differential/Platelet, Comprehensive metabolic panel  1.   Continue Tysabri.  She is JCV antibody positive and will continue Tysabri every 6 weeks.  We will recheck the titer today.  If it has increased beyond 1.5, we will consider a different disease modifying treatment..  2.  She will continue medications for her pain, myalgias, depression.  Add oxcarbazepine to see if benefit.   3.  She continues to have myalgias and multiple joint pain.  I will check some additional blood work 4.    Return in 6 months or sooner if there are new or worsening neurologic symptoms.  45-minute office visit with the majority of the time spent face-to-face for history and physical, discussion/counseling and decision-making.  Additional time with record review and documentation.  Yaakov Saindon A. [name], MD, PhD [id number], [id number] PM Certified in Neurology, Clinical Neurophysiology, Sleep Medicine, Pain Medicine and Neuroimaging  [facility] [address], [address], [place] [address] [phone]

## 2019-10-25 NOTE — Telephone Encounter (Signed)
Placed JCV lab in [facility] lock box for routine lab pick up. Results pending. 

## 2019-10-26 LAB — COMPREHENSIVE METABOLIC PANEL
ALT: 30 IU/L (ref 0–32)
AST: 21 IU/L (ref 0–40)
Albumin/Globulin Ratio: 2.1 (ref 1.2–2.2)
Albumin: 4.4 g/dL (ref 3.8–4.9)
Alkaline Phosphatase: 92 IU/L (ref 39–117)
BUN/Creatinine Ratio: 23 (ref 9–23)
BUN: 16 mg/dL (ref 6–24)
Bilirubin Total: 0.4 mg/dL (ref 0.0–1.2)
CO2: 26 mmol/L (ref 20–29)
Calcium: 9.3 mg/dL (ref 8.7–10.2)
Chloride: 102 mmol/L (ref 96–106)
Creatinine, Ser: 0.71 mg/dL (ref 0.57–1.00)
GFR calc Af Amer: 108 mL/min/{1.73_m2} (ref >59)
GFR calc non Af Amer: 94 mL/min/{1.73_m2} (ref >59)
Globulin, Total: 2.1 g/dL (ref 1.5–4.5)
Glucose: 111 mg/dL — ABNORMAL HIGH (ref 65–99)
Potassium: 3.9 mmol/L (ref 3.5–5.2)
Sodium: 143 mmol/L (ref 134–144)
Total Protein: 6.5 g/dL (ref 6.0–8.5)

## 2019-10-26 LAB — CBC WITH DIFFERENTIAL/PLATELET
Basophils Absolute: 0.1 10*3/uL (ref 0.0–0.2)
Basos: 1 %
EOS (ABSOLUTE): 0.2 10*3/uL (ref 0.0–0.4)
Eos: 4 %
Hematocrit: 40.6 % (ref 34.0–46.6)
Hemoglobin: 13.4 g/dL (ref 11.1–15.9)
Immature Grans (Abs): 0 10*3/uL (ref 0.0–0.1)
Immature Granulocytes: 1 %
Lymphocytes Absolute: 2.5 10*3/uL (ref 0.7–3.1)
Lymphs: 49 %
MCH: 29.5 pg (ref 26.6–33.0)
MCHC: 33 g/dL (ref 31.5–35.7)
MCV: 89 fL (ref 79–97)
Monocytes Absolute: 0.3 10*3/uL (ref 0.1–0.9)
Monocytes: 6 %
NRBC: 1 % — ABNORMAL HIGH (ref 0–0)
Neutrophils Absolute: 2 10*3/uL (ref 1.4–7.0)
Neutrophils: 39 %
Platelets: 257 10*3/uL (ref 150–450)
RBC: 4.54 x10E6/uL (ref 3.77–5.28)
RDW: 14.9 % (ref 11.7–15.4)
WBC: 5.2 10*3/uL (ref 3.4–10.8)

## 2019-10-26 LAB — RHEUMATOID FACTOR: Rheumatoid fact SerPl-aCnc: <10 IU/mL (ref 0.0–13.9)

## 2019-10-26 LAB — URIC ACID: Uric Acid: 4 mg/dL (ref 3.0–7.2)

## 2019-10-26 LAB — ANA W/REFLEX: Anti Nuclear Antibody (ANA): NEGATIVE

## 2019-10-26 LAB — C-REACTIVE PROTEIN: CRP: 4 mg/L (ref 0–10)

## 2019-10-26 LAB — SEDIMENTATION RATE: Sed Rate: 3 mm/hr (ref 0–40)

## 2019-10-31 ENCOUNTER — Other Ambulatory Visit: Payer: Self-pay | Admitting: Neurology

## 2019-11-07 NOTE — Telephone Encounter (Signed)
JCV ab drawn on 10/25/19 positive, index: 0.54

## 2019-11-21 ENCOUNTER — Other Ambulatory Visit: Payer: Self-pay | Admitting: Neurology

## 2019-12-19 ENCOUNTER — Other Ambulatory Visit: Payer: Self-pay | Admitting: Neurology

## 2019-12-19 NOTE — Telephone Encounter (Signed)
Pt is due for a refill on klonopin. Pt is up to date with her appts. [place] Controlled Substance Registry checked and is appropriate.

## 2019-12-28 ENCOUNTER — Telehealth: Payer: Self-pay | Admitting: *Deleted

## 2019-12-28 NOTE — Telephone Encounter (Signed)
Received fax from touch prescribing program that pt is re-authorized for Tysabri 12/28/2019-07/30/2020. Pt enrollment number: [id number]. Account: [facility]. Site auth number: [id number].

## 2019-12-28 NOTE — Telephone Encounter (Signed)
Faxed completed/signed Tysabri pt status report and reauth questionnaire to [facility] at [phone]. Received confirmation.  

## 2020-01-19 ENCOUNTER — Other Ambulatory Visit: Payer: Self-pay | Admitting: *Deleted

## 2020-01-19 MED ORDER — CYCLOBENZAPRINE HCL 10 MG PO TABS: ORAL_TABLET | ORAL | 0 refills | Status: DC

## 2020-01-19 MED ORDER — CELEBREX 200 MG PO CAPS: 200.0000 mg | ORAL_CAPSULE | Freq: Two times a day (BID) | ORAL | 0 refills | Status: DC

## 2020-01-19 MED ORDER — VERAPAMIL HCL ER 180 MG PO TBCR: 180.0000 mg | EXTENDED_RELEASE_TABLET | Freq: Every day | ORAL | 0 refills | Status: DC

## 2020-01-19 NOTE — Progress Notes (Signed)
Pt here today for her Tysabri infusion. Asked for refills on flexeril and verapamil. I e-scribed refills as requested.  She also asked if Dr. [name] would prescribe nystatin cream for yeast rash in right groin/vaginal area. Per Dr. [name], she should try and treat with OTC meds. If ineffective, should follow up with PCP or OBGYN. I relayed this to the patient.   She restarted tegretol 150mg  this week. On 5th day of 1 at night. Will be going to 2 at night next. She will call if she experiences any SE on med. I added this back to her med list.   She also only uses [facility]. I updated pharmacy list.

## 2020-01-19 NOTE — Addendum Note (Signed)
Addended by: Wyvonnia Lora on: 01/19/2020 10:53 AM   Modules accepted: Orders

## 2020-03-22 ENCOUNTER — Other Ambulatory Visit: Payer: Self-pay | Admitting: Neurology

## 2020-04-12 ENCOUNTER — Telehealth: Payer: Self-pay | Admitting: *Deleted

## 2020-04-12 ENCOUNTER — Other Ambulatory Visit: Payer: Self-pay | Admitting: Neurology

## 2020-04-12 DIAGNOSIS — G35 Multiple sclerosis: Secondary | ICD-10-CM

## 2020-04-12 NOTE — Telephone Encounter (Signed)
Placed JCV lab in [facility] lock box for routine lab pick up. Results pending. 

## 2020-04-12 NOTE — Addendum Note (Signed)
Addended by: Inis Sizer D on: 04/12/2020 12:05 PM   Modules accepted: Orders

## 2020-04-13 LAB — CBC WITH DIFFERENTIAL/PLATELET
Basophils Absolute: 0 10*3/uL (ref 0.0–0.2)
Basos: 1 %
EOS (ABSOLUTE): 0.3 10*3/uL (ref 0.0–0.4)
Eos: 6 %
Hematocrit: 40.4 % (ref 34.0–46.6)
Hemoglobin: 13.2 g/dL (ref 11.1–15.9)
Immature Grans (Abs): 0 10*3/uL (ref 0.0–0.1)
Immature Granulocytes: 0 %
Lymphocytes Absolute: 2.1 10*3/uL (ref 0.7–3.1)
Lymphs: 43 %
MCH: 28.9 pg (ref 26.6–33.0)
MCHC: 32.7 g/dL (ref 31.5–35.7)
MCV: 88 fL (ref 79–97)
Monocytes Absolute: 0.3 10*3/uL (ref 0.1–0.9)
Monocytes: 7 %
NRBC: 1 % — ABNORMAL HIGH (ref 0–0)
Neutrophils Absolute: 2.1 10*3/uL (ref 1.4–7.0)
Neutrophils: 43 %
Platelets: 252 10*3/uL (ref 150–450)
RBC: 4.57 x10E6/uL (ref 3.77–5.28)
RDW: 14.5 % (ref 11.7–15.4)
WBC: 4.8 10*3/uL (ref 3.4–10.8)

## 2020-04-16 ENCOUNTER — Telehealth: Payer: Self-pay | Admitting: Neurology

## 2020-04-16 NOTE — Telephone Encounter (Signed)
..   Pt understands that although there may be some limitations with this type of visit, we will take all precautions to reduce any security or privacy concerns.  Pt understands that this will be treated like an in office visit and we will file with pt's insurance, and there may be a patient responsible charge related to this service. ? ?

## 2020-04-19 ENCOUNTER — Other Ambulatory Visit: Payer: Self-pay | Admitting: Neurology

## 2020-04-23 NOTE — Telephone Encounter (Signed)
JCV ab drawn on 04/12/20 indeterminate, index: 0.35. Inhibition assay: positive. Given to MD to review.

## 2020-04-25 ENCOUNTER — Encounter: Payer: Self-pay | Admitting: Neurology

## 2020-04-25 ENCOUNTER — Telehealth (INDEPENDENT_AMBULATORY_CARE_PROVIDER_SITE_OTHER): Payer: 59 | Admitting: Neurology

## 2020-04-25 DIAGNOSIS — R208 Other disturbances of skin sensation: Secondary | ICD-10-CM | POA: Diagnosis not present

## 2020-04-25 DIAGNOSIS — G35 Multiple sclerosis: Secondary | ICD-10-CM

## 2020-04-25 DIAGNOSIS — G5603 Carpal tunnel syndrome, bilateral upper limbs: Secondary | ICD-10-CM

## 2020-04-25 DIAGNOSIS — Z79899 Other long term (current) drug therapy: Secondary | ICD-10-CM

## 2020-04-25 DIAGNOSIS — M2550 Pain in unspecified joint: Secondary | ICD-10-CM | POA: Diagnosis not present

## 2020-04-25 MED ORDER — CELEBREX 200 MG PO CAPS
200.0000 mg | ORAL_CAPSULE | Freq: Two times a day (BID) | ORAL | 1 refills | Status: DC
Start: 2020-04-25 — End: 2020-11-26

## 2020-04-25 MED ORDER — VERAPAMIL HCL ER 180 MG PO TBCR
180.0000 mg | EXTENDED_RELEASE_TABLET | Freq: Every day | ORAL | 3 refills | Status: DC
Start: 2020-04-25 — End: 2021-03-25

## 2020-04-25 MED ORDER — PREGABALIN 150 MG PO CAPS
150.0000 mg | ORAL_CAPSULE | Freq: Two times a day (BID) | ORAL | 3 refills | Status: DC
Start: 2020-04-25 — End: 2021-05-13

## 2020-04-25 NOTE — Addendum Note (Signed)
Addended by: Wyvonnia Lora on: 04/25/2020 10:56 AM   Modules accepted: Orders

## 2020-04-25 NOTE — Telephone Encounter (Signed)
I called pt. Updated med list, pharmacy, allergies for VV at 1pm with Dr. [name] today. Advised her to log on about 10-15 min prior to appt time. She verbalized understanding.

## 2020-04-25 NOTE — Progress Notes (Signed)
[facility]  PATIENT: Joan Ray DOB: 12/08/1960  REFERRING DOCTOR OR PCP:  Melina Schools [phone]) [name] (PCP) SOURCE: Patient, MRI images on PACS and CD and notes from PCP and ortho  _________________________________   HISTORICAL  CHIEF COMPLAINT:  No chief complaint on file.   HISTORY OF PRESENT ILLNESS:  Joan Ray is a 59 y.o. right-handed patient with relapsing remitting MS diagnosed in 2010.     Update 04/25/2020 Virtual Visit via Video Note I connected with Joan Ray  on 04/25/20 at  1:00 PM EDT by a video enabled telemedicine application and verified that I am speaking with the correct person.  I discussed the limitations of evaluation and management by telemedicine and the availability of in person appointments. The patient expressed understanding and agreed to proceed.  History of Present Illness: For MS, she is on Tysabri and tolerates it well.   She had a positive JCV Ab increasing to 0.9 when last checked but last one was 0.35 (still positive).   We increased the interval to q6 weeks.   The only other DMT she has bene on is Avonex.    She continues to note a lot of tight myalgias in her hands, aching myalgia in her legs and a frostbite sensation in her feet.  She notes her leg pain varies some day to day.  She notes that when she first stands up she is stooped over but over time she can stand up straighter.   She is on Lyrica 150 mg bid and duloxetine 120 mg for dysesthesias.  Lamotrigine was not well tolerated (tongue burning).   She has not been on trileptal or tegretol.  A steroid dose pack helped her pain and swelling in her hands.    After the last visit, She tried Oxcarbazepine 150 mg po qAm and 300 mg po qHS.  She felt cognitively off,  It did help the dysesthesias some.    MRI cervical spine 05/13/2019 shows C5-C7 ACDF and DJD at other levels, worse at C7T1 with right foraminal narrowing.     She is  overweight.   Phentermine and Saxenda did not help her lose weight.  She is having shoulder pain and seeing Ortho.     Observations/Objective:   She is a well-developed well-nourished woman in no acute distress.  The head is normocephalic and atraumatic.  Sclera are anicteric.  Visible skin appears normal.  The neck has a good range of motion.    She is alert and fully oriented with fluent speech and good attention, knowledge and memory.  Extraocular muscles are intact.  Facial strength is normal.   Rapid alternating movements and finger-nose-finger are performed well.   Assessment and Plan: Multiple sclerosis (HCC)  Dysesthesia  Multiple joint pain  High risk medication use  Carpal tunnel syndrome on both sides  1.   Continue Tysabri.  She is JCV antibody positive and will continue Tysabri every 6 weeks.  Will need to check again in about 6 months 2.  She will continue medications for her pain, myalgias, depression.   Follow up with ortho about shoulder.  3.  Stay active and exercise as tolerated. 4.    Return in 6 months or sooner if there are new or worsening neurologic symptoms.     Follow Up Instructions: I discussed the assessment and treatment plan with the patient. The patient was provided an opportunity to ask questions and all were answered. The  patient agreed with the plan and demonstrated an understanding of the instructions.    The patient was advised to call back or seek an in-person evaluation if the symptoms worsen or if the condition fails to improve as anticipated.  I provided 30 minutes of non-face-to-face time during this encounter.       Spine/limb pain history:    She reports having  L5-S1 fusion in October 2014 and C5-C7 fusion June 2016.   Although some symptoms improved after surgery, she continues to have a lot of difficulties with neck pain, arm pain, back pain and leg pain.    She was placed on Lyrica for her pain and was titrated to 200 mg twice a day but  could not tolerate that dose. She has since been reduced to 100 mg by mouth twice a day and tolerates that dose but continues to have pain.      She reports some axial neck and lower back pain but the dysesthetic pain in the hands and feet is more troublesome.    Both hands have the dysesthetic pain but in the legs, the right foot is much worse than the left.   She gets some benefit from Lyrica.   She felt her pain had been better on Cymbalta than on Effexor. However, in 2013 when she was having more neurologic issues the Cymbalta was discontinued.   In the right foot, the pain is most intense on the top of the foot and towards the ankle but not above the ankle.    I personally reviewed the MRI of the cervical spine from 10/07/2014.   It shows degenerative changes at C5-C6 resulting in left foraminal narrowing and changes at C6-C7 resulting right foraminal narrowing. The spinal cord appears normal. Plain films done after surgery showed a C5-C7 fusion and lumbar plain films showed the L5-S1 fusion.  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 She also had pain in the left eye. She went to an ophthalmologist who did some tests and referred her or an MRI. The MRI was abnormal, consistent with MS and she was referred to Dr. Maye Hides.   Usually, she was placed on weekly Avonex injections. She did not like the way that she felt on the injections. Additionally, an MRI of the brain showed that she had 2 new lesions. Therefore, she was switched to Tysabri in 2011.     On Tysabri, she has had no definite exacerbations, no change in MRI. Additionally she tolerates the medication very well. She has had some low positive JCV antibody titers. We do not have the actual numbers.    She switched from Dr. Starleen Blue to Dr. William Hamburger in [place] in 2013. She is interested in changing to an [facility].    Last MRI was early 2016 at [facility] in [place].    STUDIES: Nerve  conduction and EMG study 07/2019 showed bilateral carpal tunnel syndrome, worse on the left and a mild left chronic C7 radiculopathy without any active features.  We discussed referring her to a hand surgeon as she has noted increasing pain over the past couple months.  Additionally, the carpal tunnel syndrome on the left has worsened compared to the 2018 NCV/EMG study  MRI Cervical spine 05/13/2019 showed  That the spinal cord appears normal.   ACDF at C5-C7.  This has occurred since the 07/02/2010 MRI. .   Mild degenerative changes at C2-C3, C3-C4 and C4-C5,  T1-T2 and T2-T3 that do not lead to significant foraminal narrowing or spinal stenosis.  At C7-T1, there is moderate right foraminal narrowing but no definite nerve root compression.   Degenerative changes at C7-T1 and T1-T2 have progressed compared to the 2011 MRI.  MRI Brain 04/13/2018 showed multiple T2/FLAIR hyperintense foci in the hemispheres in a pattern and configuration consistent with chronic demyelinating plaque associated with multiple sclerosis.  None of the foci appears to be acute and there do not appear to be any new lesions compared to 09/24/2017 MRI. There is increased adenoidal tissue in the posterior nasopharynx.  This appears unchanged compared to the previous MRI.  There is a normal enhancement pattern and there are no acute findings.   REVIEW OF SYSTEMS: Constitutional: No fevers, chills, sweats, or change in appetite.    She has insomnia and headaches and restless leg syndrome she reports a lot of fatigue. Eyes: No visual changes, double vision, eye pain Ear, nose and throat: No hearing loss, ear pain, nasal congestion, sore throat Cardiovascular: No chest pain, palpitations Respiratory: No shortness of breath at rest or with exertion.   No wheezes GastrointestinaI:  She has constipation.   No nausea, vomiting, diarrhea, abdominal pain, fecal incontinence Genitourinary: No dysuria, urinary retention or frequency.  No  nocturia. Musculoskeletal: as above Integumentary: No rash, skin lesions.   Some prurutis Neurological: as above Psychiatric: Notes depression > anxiety Endocrine: No palpitations, diaphoresis, change in appetite, change in weigh or increased thirst Hematologic/Lymphatic: No anemia, purpura, petechiae. Allergic/Immunologic: No itchy/runny eyes, nasal congestion, recent allergic reactions, rashes  ALLERGIES: Allergies  Allergen Reactions  . Bee Venom Swelling  . Hydrocodone-Acetaminophen Itching  . Progesterone Other (See Comments) and Rash    increase/profuse sweating-flushing-  . Sertraline Hcl Other (See Comments)  . Statins Swelling    muscle and joint pain/tightness  . Trazodone Other (See Comments)    dizziness  . Betadine [Povidone Iodine]     Blisters  . Codeine Itching    Rash  . Darvocet [Propoxyphene N-Acetaminophen] Nausea And Vomiting  . Diazepam     Increases agitation  . Dilaudid [Hydromorphone Hcl] Itching  . Doxycycline     Facial flushing  . Keppra [Levetiracetam]     Shaking, nervousness, palpations and flushing  . Lamictal [Lamotrigine]     Causes tongue and mouth to tingle  . Lipitor [Atorvastatin]     Severe muscle and joint pain / ms relapse  . Lyrica [Pregabalin] Other (See Comments)    Doses over 300mg  decrease memory  . Neurontin [Gabapentin]     Impaired cognition, slower motor skills (intolerant)  . Other     EDTA causes blisters  . Oxycodone Hcl Itching  . Pravastatin Other (See Comments)    Severe joint pain MS relapse  . Sertraline Other (See Comments)    Increased nervousness  . Topamax [Topiramate]     Confusion  . Tramadol Other (See Comments)    "Nervous, jittery, felt weird, nausea"  . Zonisamide     Back pain, headaches, insomnia, chest pressure  . Cephalosporins Rash  . Erythromycin Itching and Rash  . Keflex [Cephalexin] Rash  . Vancomycin Rash    Pt stated, " I can't tolerate Vancomycin I get a red rash."    HOME  MEDICATIONS:  Current Outpatient Medications:  .  acetaminophen (TYLENOL) 650 MG CR tablet, Take 1,300 mg by mouth every 8 (eight) hours as needed for pain. Takes 1-2 times per day, Disp: , Rfl:  .  CELEBREX 200 MG capsule, Take 1 capsule (200 mg total) by mouth 2 (two) times daily., Disp: 180 capsule, Rfl: 1 .  cetirizine (ZYRTEC) 10 MG tablet, Take 10 mg by mouth daily., Disp: , Rfl:  .  Cholecalciferol (VITAMIN D3) 125 MCG (5000 UT) CAPS, Take 5,000 Units by mouth daily. , Disp: , Rfl:  .  clonazePAM (KLONOPIN) 0.5 MG tablet, TAKE ONE TABLET BY MOUTH TWICE A DAY AS NEEDED, Disp: 60 tablet, Rfl: 5 .  cyclobenzaprine (FLEXERIL) 10 MG tablet, TAKE ONE TABLET BY MOUTH THREE TIMES A DAY IF NEEDED, Disp: 270 tablet, Rfl: 0 .  DULoxetine (CYMBALTA) 60 MG capsule, TAKE TWO CAPSULES BY MOUTH DAILY (Patient taking differently: Take 60 mg by mouth 2 (two) times daily. ), Disp: 180 capsule, Rfl: 3 .  EPINEPHrine 0.3 mg/0.3 mL IJ SOAJ injection, Inject 0.3 mg into the muscle as needed., Disp: , Rfl:  .  Esomeprazole Magnesium 20 MG TBEC, Take 20 mg by mouth at bedtime. , Disp: , Rfl:  .  fenofibrate (TRICOR) 145 MG tablet, Take 145 mg by mouth daily., Disp: , Rfl:  .  fluticasone (FLONASE) 50 MCG/ACT nasal spray, Place 2 sprays into both nostrils at bedtime. , Disp: , Rfl:  .  hydrochlorothiazide (HYDRODIURIL) 25 MG tablet, Take 25 mg by mouth daily., Disp: , Rfl:  .  Krill Oil 500 MG CAPS, Take 2,000 mg by mouth daily. , Disp: , Rfl:  .  Melatonin 10 MG CAPS, Take 10 mg by mouth at bedtime. , Disp: , Rfl:  .  natalizumab (TYSABRI) 300 MG/15ML injection, Inject 300 mg into the vein every 30 (thirty) days. Takes for MS, Disp: , Rfl:  .  Olopatadine HCl 0.6 % SOLN, Place 2 sprays into the nose daily., Disp: , Rfl:  .  pregabalin (LYRICA) 150 MG capsule, Take 1 capsule (150 mg total) by mouth 2 (two) times daily., Disp: 180 capsule, Rfl: 3 .  rizatriptan (MAXALT) 10 MG tablet, Take 10 mg by mouth every 2  (two) hours as needed for migraine. May repeat in 2 hours if needed, Disp: , Rfl:  .  senna-docusate (SENOKOT-S) 8.6-50 MG tablet, Take 1 tablet by mouth 2 (two) times daily., Disp: , Rfl:  .  verapamil (CALAN-SR) 180 MG CR tablet, Take 1 tablet (180 mg total) by mouth at bedtime., Disp: 90 tablet, Rfl: 3 .  zaleplon (SONATA) 10 MG capsule, Take 10 mg by mouth at bedtime., Disp: , Rfl:  No current facility-administered medications for this visit.  Facility-Administered Medications Ordered in Other Visits:  .  gadopentetate dimeglumine (MAGNEVIST) injection 20 mL, 20 mL, Intravenous, Once PRN, Leesa Leifheit, Nanine Means, MD  PAST MEDICAL HISTORY: Past Medical History:  Diagnosis Date  . Anemia    h/o of   Last infusion of iron  2008  . Anxiety   . Arthritis    osteo   . Complication of anesthesia   . Constipation   . Depression    d/t decreased activity  . GERD (gastroesophageal reflux disease)   . Headache(784.0)    migraines  . High triglycerides   . MS (multiple sclerosis) ([place])   . Neuromuscular disorder ([place])    mutliple sclerosis--dx 10/2008  . PONV (postoperative nausea and vomiting)   . Restless leg syndrome   . Wears glasses   . Wears glasses     PAST SURGICAL HISTORY: Past Surgical History:  Procedure Laterality Date  . ANTERIOR CERVICAL DECOMP/DISCECTOMY FUSION N/A 12/21/2014   Procedure: ANTERIOR  CERVICAL DECOMPRESSION/DISCECTOMY FUSION 2 LEVELS C5-7;  Surgeon: Melina Schools, MD;  Location: [facility];  Service: Orthopedics;  Laterality: N/A;  . BLADDER SUSPENSION     occas  blood in urine  . CARPAL TUNNEL RELEASE    . ENDOMETRIAL ABLATION  2010  . FOOT ARTHRODESIS Right 07/28/2013   Procedure: ARTHRODESIS RIGHT MIDFOOT OF THE FIRST -  THIRD TARSOMETATARSAL JOINTS ;  Surgeon: Wylene Simmer, MD;  Location: [facility];  Service: Orthopedics;  Laterality: Right;  . GASTROCNEMIUS RECESSION Right 07/28/2013   Procedure: RIGHT GASTROCNEMIUS RECESSION;  Surgeon: Wylene Simmer, MD;  Location: [facility];  Service: Orthopedics;  Laterality: Right;  . JOINT REPLACEMENT  2012   lt total knee  . KNEE ARTHROSCOPY     bilateral  in 2010, has multiple scopes  . MEDIAL PARTIAL KNEE REPLACEMENT     right  . nerve blocks    . SPINAL FUSION  05-05-2013  . stress fracture     right 2-3 toe  . TOTAL KNEE ARTHROPLASTY  2008   partial rt   . TOTAL SHOULDER ARTHROPLASTY Left 03/24/2019   Procedure: TOTAL SHOULDER ARTHROPLASTY;  Surgeon: Justice Britain, MD;  Location: [facility];  Service: Orthopedics;  Laterality: Left;  133min     FAMILY HISTORY: Family History  Problem Relation Age of Onset  . Heart disease Mother   . Cancer Mother   . Hypertension Mother   . Depression Mother   . Heart disease Other   . Hypertension Maternal Grandmother   . Diabetes Maternal Grandmother   . Kidney disease Maternal Grandmother   . Rheum arthritis Maternal Grandmother   . Depression Maternal Grandmother   . Hypertension Paternal Grandmother   . Kidney disease Maternal Grandfather   . Depression Maternal Grandfather   . Depression Brother     SOCIAL HISTORY:  Social History   Socioeconomic History  . Marital status: Married    Spouse name: Not on file  . Number of children: 3  . Years of education: Not on file  . Highest education level: Not on file  Occupational History    Comment: RN  Tobacco Use  . Smoking status: Never Smoker  . Smokeless tobacco: Never Used  Vaping Use  . Vaping Use: Never used  Substance and Sexual Activity  . Alcohol use: Yes    Comment: socially--wine & beer  . Drug use: No  . Sexual activity: Not on file  Other Topics Concern  . Not on file  Social History Narrative   Lives at home with   Caffeine use:    Social Determinants of Health   Financial Resource Strain:   . Difficulty of Paying Living Expenses: Not on file  Food Insecurity:   . Worried About Charity fundraiser in the Last Year: Not on file  . Ran  Out of Food in the Last Year: Not on file  Transportation Needs:   . Lack of Transportation (Medical): Not on file  . Lack of Transportation (Non-Medical): Not on file  Physical Activity:   . Days of Exercise per Week: Not on file  . Minutes of Exercise per Session: Not on file  Stress:   . Feeling of Stress : Not on file  Social Connections:   . Frequency of Communication with Friends and Family: Not on file  . Frequency of Social Gatherings with Friends and Family: Not on file  . Attends Religious Services: Not on file  . Active Member  of Clubs or Organizations: Not on file  . Attends Archivist Meetings: Not on file  . Marital Status: Not on file  Intimate Partner Violence:   . Fear of Current or Ex-Partner: Not on file  . Emotionally Abused: Not on file  . Physically Abused: Not on file  . Sexually Abused: Not on file        DIAGNOSTIC DATA (LABS, IMAGING, TESTING) - I reviewed patient records, labs, notes, testing and imaging myself where available.  Lab Results  Component Value Date   WBC 4.8 04/12/2020   HGB 13.2 04/12/2020   HCT 40.4 04/12/2020   MCV 88 04/12/2020   PLT 252 04/12/2020      Component Value Date/Time   NA 143 10/25/2019 1343   K 3.9 10/25/2019 1343   CL 102 10/25/2019 1343   CO2 26 10/25/2019 1343   GLUCOSE 111 (H) 10/25/2019 1343   GLUCOSE 128 (H) 03/15/2019 1105   BUN 16 10/25/2019 1343   CREATININE 0.71 10/25/2019 1343   CALCIUM 9.3 10/25/2019 1343   PROT 6.5 10/25/2019 1343   ALBUMIN 4.4 10/25/2019 1343   AST 21 10/25/2019 1343   ALT 30 10/25/2019 1343   ALKPHOS 92 10/25/2019 1343   BILITOT 0.4 10/25/2019 1343   GFRNONAA 94 10/25/2019 1343   GFRAA 108 10/25/2019 1343      ASSESSMENT AND PLAN  Multiple sclerosis (HCC)  Dysesthesia  Multiple joint pain  High risk medication use  Carpal tunnel syndrome on both sides  45-minute office visit with the majority of the time spent face-to-face for history and  physical, discussion/counseling and decision-making.  Additional time with record review and documentation.  Minela Bridgewater A. [name], MD, PhD 68/06/7516, 0:01 PM Certified in Neurology, Clinical Neurophysiology, Sleep Medicine, Pain Medicine and Neuroimaging  [facility] [address] [place], [place] [address] [phone]

## 2020-05-02 ENCOUNTER — Telehealth: Payer: Self-pay | Admitting: Neurology

## 2020-05-02 NOTE — Telephone Encounter (Signed)
[facility]) called, have prescription for pregabalin (LYRICA) 150 MG capsule. On the bottle in red letters says Pt is allergy to Pregabalin. Would like call from the nurse. Contact number: [phone]

## 2020-05-02 NOTE — Telephone Encounter (Signed)
Called pharmacy back and spoke with [facility]. Ben on another call. Advised Lyrica on allergy list stating pt cannot tolerate over 300mg /day d/t causing memory issues in the past. She has been on 150mg  po BID and tolerating well. She verbalized understanding and will let Ben know.

## 2020-05-14 DIAGNOSIS — Z0289 Encounter for other administrative examinations: Secondary | ICD-10-CM

## 2020-05-14 NOTE — Telephone Encounter (Signed)
Gave completed/signed form back to medical records to process for pt. 

## 2020-07-09 ENCOUNTER — Telehealth: Payer: Self-pay | Admitting: *Deleted

## 2020-07-09 NOTE — Telephone Encounter (Signed)
Faxed completed/signed Tysabri pt status report and reauth questionnaire to [facility] at [phone]. Received confirmation.   Received fax from touch prescribing program that pt is re-authorized for Tysabri 07/09/2020-01/29/2021. Pt enrollment number: [id number]. Account: [facility]. Site auth number: [id number].

## 2020-07-10 NOTE — Telephone Encounter (Signed)
Submitted urgent PA on CMM. Key: [id number]. Waiting on determination from [facility].

## 2020-07-10 NOTE — Telephone Encounter (Signed)
Called pt. She will upload the front/back of her insurance cards to her mychart account so I can proceed with completing PA for Celebrex.

## 2020-07-23 NOTE — Patient Instructions (Addendum)
DUE TO COVID-19 ONLY ONE VISITOR IS ALLOWED TO COME WITH YOU AND STAY IN THE WAITING ROOM ONLY DURING PRE OP AND PROCEDURE DAY OF SURGERY. THE 1 VISITOR  MAY VISIT WITH YOU AFTER SURGERY IN YOUR PRIVATE ROOM DURING VISITING HOURS ONLY!  YOU NEED TO HAVE A COVID 19 TEST ON: 07/08/20 @ 12:30 PM, THIS TEST MUST BE DONE BEFORE SURGERY,  COVID TESTING SITE [address] [place] [address], IT IS ON THE RIGHT GOING OUT [address] APPROXIMATELY  2 MINUTES PAST [employer] ON THE RIGHT. ONCE YOUR COVID TEST IS COMPLETED,  PLEASE BEGIN THE QUARANTINE INSTRUCTIONS AS OUTLINED IN YOUR HANDOUT.                Lekita Kerekes    Your procedure is scheduled on: 08/02/20   Report to  Medical Center Main  Entrance   Report to admitting at: 7:30 AM     Call this number if you have problems the morning of surgery [phone]    Remember:   NO SOLID FOOD AFTER MIDNIGHT THE NIGHT PRIOR TO SURGERY. NOTHING BY MOUTH EXCEPT CLEAR LIQUIDS UNTIL: 7:00 AM . PLEASE FINISH ENSURE DRINK PER SURGEON ORDER  WHICH NEEDS TO BE COMPLETED AT : 7:00 AM.  CLEAR LIQUID DIET  Foods Allowed                                                                     Foods Excluded  Coffee and tea, regular and decaf                             liquids that you cannot  Plain Jell-O any favor except red or purple                                           see through such as: Fruit ices (not with fruit pulp)                                     milk, soups, orange juice  Iced Popsicles                                    All solid food Carbonated beverages, regular and diet                                    Cranberry, grape and apple juices Sports drinks like Gatorade Lightly seasoned clear broth or consume(fat free) Sugar, honey syrup  Sample Menu Breakfast                                Lunch  Supper Cranberry juice                    Beef broth                             Chicken broth Jell-O                                     Grape juice                           Apple juice Coffee or tea                        Jell-O                                      Popsicle                                                Coffee or tea                        Coffee or tea  _____________________________________________________________________   BRUSH YOUR TEETH MORNING OF SURGERY AND RINSE YOUR MOUTH OUT, NO CHEWING GUM CANDY OR MINTS.   Take these medicines the morning of surgery with A SIP OF WATER: Cymbalta,lyrica,cetirizine,esomeprazole. Use clonazepam and Flonase as usual.                               You may not have any metal on your body including hair pins and              piercings  Do not wear jewelry, make-up, lotions, powders or perfumes, deodorant             Do not wear nail polish on your fingernails.  Do not shave  48 hours prior to surgery.   Do not bring valuables to the hospital. [place].  Contacts, dentures or bridgework may not be worn into surgery.  Leave suitcase in the car. After surgery it may be brought to your room.     Patients discharged the day of surgery will not be allowed to drive home. IF YOU ARE HAVING SURGERY AND GOING HOME THE SAME DAY, YOU MUST HAVE AN ADULT TO DRIVE YOU HOME AND BE WITH YOU FOR 24 HOURS. YOU MAY GO HOME BY TAXI OR UBER OR ORTHERWISE, BUT AN ADULT MUST ACCOMPANY YOU HOME AND STAY WITH YOU FOR 24 HOURS.  Name and phone number of your driver:  Special Instructions: N/A              Please read over the following fact sheets you were given: _____________________________________________________________________         [facility] - Preparing for Surgery Before surgery, you can play an important role.  Because skin is not sterile, your skin needs to be as free of germs as  possible.  You can reduce the number of germs on your skin by washing with CHG  (chlorahexidine gluconate) soap before surgery.  CHG is an antiseptic cleaner which kills germs and bonds with the skin to continue killing germs even after washing. Please DO NOT use if you have an allergy to CHG or antibacterial soaps.  If your skin becomes reddened/irritated stop using the CHG and inform your nurse when you arrive at Short Stay. Do not shave (including legs and underarms) for at least 48 hours prior to the first CHG shower.  You may shave your face/neck. Please follow these instructions carefully:  1.  Shower with CHG Soap the night before surgery and the  morning of Surgery.  2.  If you choose to wash your hair, wash your hair first as usual with your  normal  shampoo.  3.  After you shampoo, rinse your hair and body thoroughly to remove the  shampoo.                           4.  Use CHG as you would any other liquid soap.  You can apply chg directly  to the skin and wash                       Gently with a scrungie or clean washcloth.  5.  Apply the CHG Soap to your body ONLY FROM THE NECK DOWN.   Do not use on face/ open                           Wound or open sores. Avoid contact with eyes, ears mouth and genitals (private parts).                       Wash face,  Genitals (private parts) with your normal soap.             6.  Wash thoroughly, paying special attention to the area where your surgery  will be performed.  7.  Thoroughly rinse your body with warm water from the neck down.  8.  DO NOT shower/wash with your normal soap after using and rinsing off  the CHG Soap.                9.  Pat yourself dry with a clean towel.            10.  Wear clean pajamas.            11.  Place clean sheets on your bed the night of your first shower and do not  sleep with pets. Day of Surgery : Do not apply any lotions/deodorants the morning of surgery.  Please wear clean clothes to the hospital/surgery center.  FAILURE TO FOLLOW THESE INSTRUCTIONS MAY RESULT IN THE CANCELLATION OF  YOUR SURGERY PATIENT SIGNATURE_________________________________  NURSE SIGNATURE__________________________________  ________________________________________________________________________  [facility]- Preparing for Total Shoulder Arthroplasty    Before surgery, you can play an important role. Because skin is not sterile, your skin needs to be as free of germs as possible. You can reduce the number of germs on your skin by using the following products.  Benzoyl Peroxide Gel o Reduces the number of germs present on the skin o Applied twice a day to shoulder area starting two days before surgery    ==================================================================  Please follow these instructions carefully:  BENZOYL PEROXIDE 5% GEL  Please do not use if you have an allergy to benzoyl peroxide.   If your skin becomes reddened/irritated stop using the benzoyl peroxide.  Starting two days before surgery, apply as follows: 1. Apply benzoyl peroxide in the morning and at night. Apply after taking a shower. If you are not taking a shower clean entire shoulder front, back, and side along with the armpit with a clean wet washcloth.  2. Place a quarter-sized dollop on your shoulder and rub in thoroughly, making sure to cover the front, back, and side of your shoulder, along with the armpit.   2 days before ____ AM   ____ PM              1 day before ____ AM   ____ PM                         3. Do this twice a day for two days.  (Last application is the night before surgery, AFTER using the CHG soap as described below).  4. Do NOT apply benzoyl peroxide gel on the day of surgery.   Incentive Spirometer  An incentive spirometer is a tool that can help keep your lungs clear and active. This tool measures how well you are filling your lungs with each breath. Taking long deep breaths may help reverse or decrease the chance of developing breathing (pulmonary) problems (especially infection)  following:  A long period of time when you are unable to move or be active. BEFORE THE PROCEDURE   If the spirometer includes an indicator to show your best effort, your nurse or respiratory therapist will set it to a desired goal.  If possible, sit up straight or lean slightly forward. Try not to slouch.  Hold the incentive spirometer in an upright position. INSTRUCTIONS FOR USE  1. Sit on the edge of your bed if possible, or sit up as far as you can in bed or on a chair. 2. Hold the incentive spirometer in an upright position. 3. Breathe out normally. 4. Place the mouthpiece in your mouth and seal your lips tightly around it. 5. Breathe in slowly and as deeply as possible, raising the piston or the ball toward the top of the column. 6. Hold your breath for 3-5 seconds or for as long as possible. Allow the piston or ball to fall to the bottom of the column. 7. Remove the mouthpiece from your mouth and breathe out normally. 8. Rest for a few seconds and repeat Steps 1 through 7 at least 10 times every 1-2 hours when you are awake. Take your time and take a few normal breaths between deep breaths. 9. The spirometer may include an indicator to show your best effort. Use the indicator as a goal to work toward during each repetition. 10. After each set of 10 deep breaths, practice coughing to be sure your lungs are clear. If you have an incision (the cut made at the time of surgery), support your incision when coughing by placing a pillow or rolled up towels firmly against it. Once you are able to get out of bed, walk around indoors and cough well. You may stop using the incentive spirometer when instructed by your caregiver.  RISKS AND COMPLICATIONS  Take your time so you do not get dizzy or light-headed.  If you are in pain, you may need to take or ask for pain medication before doing incentive spirometry.  It is harder to take a deep breath if you are having pain. AFTER USE  Rest and  breathe slowly and easily.  It can be helpful to keep track of a log of your progress. Your caregiver can provide you with a simple table to help with this. If you are using the spirometer at home, follow these instructions: [place] IF:   You are having difficultly using the spirometer.  You have trouble using the spirometer as often as instructed.  Your pain medication is not giving enough relief while using the spirometer.  You develop fever of 100.5 F (38.1 C) or higher. SEEK IMMEDIATE MEDICAL CARE IF:   You cough up bloody sputum that had not been present before.  You develop fever of 102 F (38.9 C) or greater.  You develop worsening pain at or near the incision site. MAKE SURE YOU:   Understand these instructions.  Will watch your condition.  Will get help right away if you are not doing well or get worse. Document Released: 11/03/2006 Document Revised: 09/15/2011 Document Reviewed: 01/04/2007 [facility] Patient Information 2014 [place], [place].   ________________________________________________________________________

## 2020-07-24 ENCOUNTER — Encounter (HOSPITAL_COMMUNITY)
Admission: RE | Admit: 2020-07-24 | Discharge: 2020-07-24 | Disposition: A | Discharge status: Home / Self Care | Payer: 59 | Source: Ambulatory Visit | Attending: Orthopedic Surgery | Admitting: Orthopedic Surgery

## 2020-07-24 ENCOUNTER — Other Ambulatory Visit: Payer: Self-pay

## 2020-07-24 ENCOUNTER — Encounter (HOSPITAL_COMMUNITY): Payer: Self-pay

## 2020-07-24 DIAGNOSIS — Z01818 Encounter for other preprocedural examination: Secondary | ICD-10-CM | POA: Insufficient documentation

## 2020-07-24 HISTORY — DX: Essential (primary) hypertension: I10

## 2020-07-24 NOTE — Progress Notes (Signed)
COVID Vaccine Completed: Date COVID Vaccine completed: COVID vaccine manufacturer: Pfizer    Moderna   [employer]   PCP -  Cardiologist -   Chest x-ray -  EKG -  Stress Test -  ECHO -  Cardiac Cath -  Pacemaker/ICD device last checked:  Sleep Study -  CPAP -   Fasting Blood Sugar -  Checks Blood Sugar _____ times a day  Blood Thinner Instructions: Aspirin Instructions: Last Dose:  Anesthesia review:   Patient denies shortness of breath, fever, cough and chest pain at PAT appointment   Patient verbalized understanding of instructions that were given to them at the PAT appointment. Patient was also instructed that they will need to review over the PAT instructions again at home before surgery. 

## 2020-07-24 NOTE — Progress Notes (Signed)
PAT interview was done over the phone due to pt. Having cough,ear pain,sinus pain and congestion.

## 2020-07-25 NOTE — Progress Notes (Signed)
Pt. Call to notified us,that she was positive for COVID.

## 2020-07-31 ENCOUNTER — Inpatient Hospital Stay (HOSPITAL_COMMUNITY): Admission: RE | Admit: 2020-07-31 | Payer: 59 | Source: Ambulatory Visit

## 2020-07-31 ENCOUNTER — Other Ambulatory Visit (HOSPITAL_COMMUNITY): Payer: 59

## 2020-08-02 ENCOUNTER — Ambulatory Visit (HOSPITAL_COMMUNITY): Admission: RE | Admit: 2020-08-02 | Payer: 59 | Source: Home / Self Care | Admitting: Orthopedic Surgery

## 2020-08-02 ENCOUNTER — Encounter (HOSPITAL_COMMUNITY): Admission: RE | Payer: Self-pay | Source: Home / Self Care

## 2020-08-02 SURGERY — ARTHROPLASTY, SHOULDER, TOTAL
Anesthesia: General | Site: Shoulder | Laterality: Right

## 2020-08-31 NOTE — Patient Instructions (Addendum)
DUE TO COVID-19 ONLY ONE VISITOR IS ALLOWED TO COME WITH YOU AND STAY IN THE WAITING ROOM ONLY DURING PRE OP AND PROCEDURE.          Your procedure is scheduled on:   Thursday, 09-13-20   Report to [facility] Main  Entrance   Report to Short Stay at 5:30 AM   [facility])   Call this number if you have problems the morning of surgery [phone]   Do not eat food :After Midnight.   May have liquids until 4:30 AM day of surgery  CLEAR LIQUID DIET  Foods Allowed                                                                     Foods Excluded  Water, Black Coffee and tea, regular and decaf            liquids that you cannot  Plain Jell-O in any flavor  (No red)                                   see through such as: Fruit ices (not with fruit pulp)                                      milk, soups, orange juice              Iced Popsicles (No red)                                      All solid food                                   Apple juices Sports drinks like Gatorade (No red) Lightly seasoned clear broth or consume(fat free) Sugar, honey syrup    Complete one G2 drink the morning of surgery at  4:30 AM  the day of surgery.       1. The day of surgery:  ? Drink ONE (1) Pre-Surgery Clear Ensure or G2 by am the morning of surgery. Drink in one sitting. Do not sip.  ? This drink was given to you during your hospital  pre-op appointment visit. ? Nothing else to drink after completing the  Pre-Surgery Clear Ensure or G2.          If you have questions, please contact your surgeon's office.     Oral Hygiene is also important to reduce your risk of infection.                                    Remember - BRUSH YOUR TEETH THE MORNING OF SURGERY WITH YOUR REGULAR TOOTHPASTE   Do NOT smoke after Midnight   Take these medicines the morning of surgery with A SIP OF WATER:   DO NOT TAKE Semaglutide  DAY OF YOUR SURGERY  You may not  have any metal on your body including hair pins, jewelry, and body piercings             Do not wear make-up, lotions, powders, perfumes/cologne, or deodorant             Do not wear nail polish.  Do not shave  48 hours prior to surgery.         Do not bring valuables to the hospital. [place].   Contacts, dentures or bridgework may not be worn into surgery.      Patients discharged the day of surgery will not be allowed to drive home.                Please read over the following fact sheets you were given: IF YOU HAVE QUESTIONS ABOUT YOUR PRE OP INSTRUCTIONS PLEASE CALL  [place]- Preparing for Total Shoulder Arthroplasty    Before surgery, you can play an important role. Because skin is not sterile, your skin needs to be as free of germs as possible. You can reduce the number of germs on your skin by using the following products. . Benzoyl Peroxide Gel o Reduces the number of germs present on the skin o Applied twice a day to shoulder area starting two days before surgery    ==================================================================  Please follow these instructions carefully:  BENZOYL PEROXIDE 5% GEL  Please do not use if you have an allergy to benzoyl peroxide.   If your skin becomes reddened/irritated stop using the benzoyl peroxide.  Starting two days before surgery, apply as follows: 1. Apply benzoyl peroxide in the morning and at night. Apply after taking a shower. If you are not taking a shower clean entire shoulder front, back, and side along with the armpit with a clean wet washcloth.  2. Place a quarter-sized dollop on your shoulder and rub in thoroughly, making sure to cover the front, back, and side of your shoulder, along with the armpit.   2 days before ____ AM   ____ PM              1 day before ____ AM   ____ PM                         3. Do this twice a day for two days.  (Last  application is the night before surgery, AFTER using the CHG soap as described below).  4. Do NOT apply benzoyl peroxide gel on the day of surgery.   [place] - Preparing for Surgery Before surgery, you can play an important role.  Because skin is not sterile, your skin needs to be as free of germs as possible.  You can reduce the number of germs on your skin by washing with CHG (chlorahexidine gluconate) soap before surgery.  CHG is an antiseptic cleaner which kills germs and bonds with the skin to continue killing germs even after washing. Please DO NOT use if you have an allergy to CHG or antibacterial soaps.  If your skin becomes reddened/irritated stop using the CHG and inform your nurse when you arrive at Short Stay. Do not shave (including legs and underarms) for at least 48 hours prior to the first CHG shower.  You may shave your face/neck.  Please follow these instructions carefully:  1.  Shower with CHG Soap the night before surgery and the  morning of surgery.  2.  If you choose to wash your hair, wash your hair first as usual with your normal  shampoo.  3.  After you shampoo, rinse your hair and body thoroughly to remove the shampoo.                             4.  Use CHG as you would any other liquid soap.  You can apply chg directly to the skin and wash.  Gently with a scrungie or clean washcloth.  5.  Apply the CHG Soap to your body ONLY FROM THE NECK DOWN.   Do   not use on face/ open                           Wound or open sores. Avoid contact with eyes, ears mouth and   genitals (private parts).                       Wash face,  Genitals (private parts) with your normal soap.             6.  Wash thoroughly, paying special attention to the area where your    surgery  will be performed.  7.  Thoroughly rinse your body with warm water from the neck down.  8.  DO NOT shower/wash with your normal soap after using and rinsing off the CHG Soap.                9.  Pat yourself dry  with a clean towel.            10.  Wear clean pajamas.            11.  Place clean sheets on your bed the night of your first shower and do not  sleep with pets. Day of Surgery : Do not apply any lotions/deodorants the morning of surgery.  Please wear clean clothes to the hospital/surgery center.  FAILURE TO FOLLOW THESE INSTRUCTIONS MAY RESULT IN THE CANCELLATION OF YOUR SURGERY  PATIENT SIGNATURE_________________________________  NURSE SIGNATURE__________________________________  ________________________________________________________________________   Joan Ray  An incentive spirometer is a tool that can help keep your lungs clear and active. This tool measures how well you are filling your lungs with each breath. Taking long deep breaths may help reverse or decrease the chance of developing breathing (pulmonary) problems (especially infection) following:  A long period of time when you are unable to move or be active. BEFORE THE PROCEDURE   If the spirometer includes an indicator to show your best effort, your nurse or respiratory therapist will set it to a desired goal.  If possible, sit up straight or lean slightly forward. Try not to slouch.  Hold the incentive spirometer in an upright position. INSTRUCTIONS FOR USE  1. Sit on the edge of your bed if possible, or sit up as far as you can in bed or on a chair. 2. Hold the incentive spirometer in an upright position. 3. Breathe out normally. 4. Place the mouthpiece in your mouth and seal your lips tightly around it. 5. Breathe in slowly and as deeply as possible, raising the piston or the ball toward the top of the column. 6. Hold your breath for 3-5 seconds or for as long as possible. Allow the piston or  ball to fall to the bottom of the column. 7. Remove the mouthpiece from your mouth and breathe out normally. 8. Rest for a few seconds and repeat Steps 1 through 7 at least 10 times every 1-2 hours when you are  awake. Take your time and take a few normal breaths between deep breaths. 9. The spirometer may include an indicator to show your best effort. Use the indicator as a goal to work toward during each repetition. 10. After each set of 10 deep breaths, practice coughing to be sure your lungs are clear. If you have an incision (the cut made at the time of surgery), support your incision when coughing by placing a pillow or rolled up towels firmly against it. Once you are able to get out of bed, walk around indoors and cough well. You may stop using the incentive spirometer when instructed by your caregiver.  RISKS AND COMPLICATIONS  Take your time so you do not get dizzy or light-headed.  If you are in pain, you may need to take or ask for pain medication before doing incentive spirometry. It is harder to take a deep breath if you are having pain. AFTER USE  Rest and breathe slowly and easily.  It can be helpful to keep track of a log of your progress. Your caregiver can provide you with a simple table to help with this. If you are using the spirometer at home, follow these instructions: [place] IF:   You are having difficultly using the spirometer.  You have trouble using the spirometer as often as instructed.  Your pain medication is not giving enough relief while using the spirometer.  You develop fever of 100.5 F (38.1 C) or higher. SEEK IMMEDIATE MEDICAL CARE IF:   You cough up bloody sputum that had not been present before.  You develop fever of 102 F (38.9 C) or greater.  You develop worsening pain at or near the incision site. MAKE SURE YOU:   Understand these instructions.  Will watch your condition.  Will get help right away if you are not doing well or get worse. Document Released: 11/03/2006 Document Revised: 09/15/2011 Document Reviewed: 01/04/2007 [facility] Patient Information 2014 [place],  [place].   ________________________________________________________________________

## 2020-08-31 NOTE — Progress Notes (Addendum)
COVID Vaccine Completed: x1 Date COVID Vaccine completed:  04-27-20 Has received booster: COVID vaccine manufacturer: [place]   Date of COVID positive in last 90 days: +Covid 07-23-20  [place]   PCP - Maxcine Ham, MD Cardiologist - N/A  Chest x-ray - 08-20-20 EKG - 09-03-20 Epic Stress Test - 20 years ago ECHO -  Cardiac Cath -  Pacemaker/ICD device last checked:  Sleep Study - mild sleep apnea CPAP - No  Fasting Blood Sugar - N/A Checks Blood Sugar _____ times a day  Blood Thinner Instructions:  N/A Aspirin Instructions: Last Dose:  Activity level:  Can go up a flight of stairs and perform activities of daily living without stopping and without symptoms       Anesthesia review:  MS, mild sleep apnea  Patient denies shortness of breath, fever, cough and chest pain at PAT appointment   Patient verbalized understanding of instructions that were given to them at the PAT appointment. Patient was also instructed that they will need to review over the PAT instructions again at home before surgery.

## 2020-09-01 ENCOUNTER — Other Ambulatory Visit: Payer: Self-pay | Admitting: Neurology

## 2020-09-03 ENCOUNTER — Encounter (HOSPITAL_COMMUNITY)
Admission: RE | Admit: 2020-09-03 | Discharge: 2020-09-03 | Disposition: A | Discharge status: Home / Self Care | Payer: 59 | Source: Ambulatory Visit | Attending: Orthopedic Surgery | Admitting: Orthopedic Surgery

## 2020-09-03 ENCOUNTER — Encounter (HOSPITAL_COMMUNITY): Payer: Self-pay

## 2020-09-03 ENCOUNTER — Other Ambulatory Visit: Payer: Self-pay

## 2020-09-03 DIAGNOSIS — I10 Essential (primary) hypertension: Secondary | ICD-10-CM | POA: Insufficient documentation

## 2020-09-03 DIAGNOSIS — Z01818 Encounter for other preprocedural examination: Secondary | ICD-10-CM | POA: Insufficient documentation

## 2020-09-03 HISTORY — DX: Prediabetes: R73.03

## 2020-09-03 HISTORY — DX: Sleep apnea, unspecified: G47.30

## 2020-09-03 LAB — BASIC METABOLIC PANEL
Anion gap: 11 (ref 5–15)
BUN: 17 mg/dL (ref 6–20)
CO2: 27 mmol/L (ref 22–32)
Calcium: 9.5 mg/dL (ref 8.9–10.3)
Chloride: 104 mmol/L (ref 98–111)
Creatinine, Ser: 0.76 mg/dL (ref 0.44–1.00)
GFR, Estimated: >60 mL/min (ref >60)
Glucose, Bld: 109 mg/dL — ABNORMAL HIGH (ref 70–99)
Potassium: 3.6 mmol/L (ref 3.5–5.1)
Sodium: 142 mmol/L (ref 135–145)

## 2020-09-03 LAB — HEMOGLOBIN A1C
Hgb A1c MFr Bld: 5.8 % — ABNORMAL HIGH (ref 4.8–5.6)
Mean Plasma Glucose: 119.76 mg/dL

## 2020-09-03 LAB — CBC
HCT: 39.6 % (ref 36.0–46.0)
Hemoglobin: 13 g/dL (ref 12.0–15.0)
MCH: 29.4 pg (ref 26.0–34.0)
MCHC: 32.8 g/dL (ref 30.0–36.0)
MCV: 89.6 fL (ref 80.0–100.0)
Platelets: 259 10*3/uL (ref 150–400)
RBC: 4.42 MIL/uL (ref 3.87–5.11)
RDW: 14.6 % (ref 11.5–15.5)
WBC: 6.3 10*3/uL (ref 4.0–10.5)
nRBC: 1.4 % — ABNORMAL HIGH (ref 0.0–0.2)

## 2020-09-03 LAB — SURGICAL PCR SCREEN
MRSA, PCR: NEGATIVE
Staphylococcus aureus: NEGATIVE

## 2020-09-12 ENCOUNTER — Encounter (HOSPITAL_COMMUNITY): Payer: Self-pay | Admitting: Orthopedic Surgery

## 2020-09-12 NOTE — Anesthesia Preprocedure Evaluation (Addendum)
Anesthesia Evaluation  Patient identified by MRN, date of birth, ID band Patient awake    Reviewed: Allergy & Precautions, NPO status , Patient's Chart, lab work & pertinent test results  History of Anesthesia Complications (+) PONV, AWARENESS UNDER ANESTHESIA and history of anesthetic complications  Airway Mallampati: III  TM Distance: >3 FB Neck ROM: Full    Dental no notable dental hx. (+) Teeth Intact, Caps   Pulmonary sleep apnea and Continuous Positive Airway Pressure Ventilation ,  Covid-19 07/17/20 recovered   Pulmonary exam normal breath sounds clear to auscultation       Cardiovascular hypertension, Normal cardiovascular exam Rhythm:Regular Rate:Normal     Neuro/Psych  Headaches, PSYCHIATRIC DISORDERS Anxiety Depression Multiple sclerosis  Neuromuscular disease    GI/Hepatic Neg liver ROS, GERD  Medicated and Controlled,  Endo/Other  Morbid obesityHyperlipidemia  Renal/GU negative Renal ROS  negative genitourinary   Musculoskeletal  (+) Arthritis , Osteoarthritis,  Right shoulder OA   Abdominal (+) + obese,   Peds  Hematology  (+) anemia ,   Anesthesia Other Findings   Reproductive/Obstetrics                           Anesthesia Physical Anesthesia Plan  ASA: III  Anesthesia Plan: General   Post-op Pain Management:  Regional for Post-op pain   Induction: Intravenous  PONV Risk Score and Plan: Treatment may vary due to age or medical condition, Ondansetron, Amisulpride, Dexamethasone, Midazolam and Scopolamine patch - Pre-op  Airway Management Planned: Oral ETT  Additional Equipment:   Intra-op Plan:   Post-operative Plan: Extubation in OR  Informed Consent: I have reviewed the patients History and Physical, chart, labs and discussed the procedure including the risks, benefits and alternatives for the proposed anesthesia with the patient or authorized representative who  has indicated his/her understanding and acceptance.     Dental advisory given  Plan Discussed with: CRNA and Anesthesiologist  Anesthesia Plan Comments:       Anesthesia Quick Evaluation

## 2020-09-13 ENCOUNTER — Ambulatory Visit (HOSPITAL_COMMUNITY): Payer: 59 | Admitting: Anesthesiology

## 2020-09-13 ENCOUNTER — Ambulatory Visit (HOSPITAL_COMMUNITY): Payer: 59 | Admitting: Physician Assistant

## 2020-09-13 ENCOUNTER — Encounter (HOSPITAL_COMMUNITY)
Admission: RE | Disposition: A | Discharge status: Home / Self Care | Payer: Self-pay | Source: Ambulatory Visit | Attending: Orthopedic Surgery

## 2020-09-13 ENCOUNTER — Ambulatory Visit (HOSPITAL_COMMUNITY)
Admission: RE | Admit: 2020-09-13 | Discharge: 2020-09-13 | Disposition: A | Discharge status: Home / Self Care | Payer: 59 | Source: Ambulatory Visit | Attending: Orthopedic Surgery | Admitting: Orthopedic Surgery

## 2020-09-13 DIAGNOSIS — M19011 Primary osteoarthritis, right shoulder: Secondary | ICD-10-CM | POA: Diagnosis not present

## 2020-09-13 DIAGNOSIS — Z96612 Presence of left artificial shoulder joint: Secondary | ICD-10-CM | POA: Diagnosis not present

## 2020-09-13 DIAGNOSIS — Z79899 Other long term (current) drug therapy: Secondary | ICD-10-CM | POA: Insufficient documentation

## 2020-09-13 DIAGNOSIS — Z791 Long term (current) use of non-steroidal anti-inflammatories (NSAID): Secondary | ICD-10-CM | POA: Diagnosis not present

## 2020-09-13 DIAGNOSIS — Z96651 Presence of right artificial knee joint: Secondary | ICD-10-CM | POA: Diagnosis not present

## 2020-09-13 HISTORY — PX: TOTAL SHOULDER ARTHROPLASTY: SHX126

## 2020-09-13 SURGERY — ARTHROPLASTY, SHOULDER, TOTAL
Anesthesia: General | Site: Shoulder | Laterality: Right

## 2020-09-13 MED ORDER — PHENYLEPHRINE 40 MCG/ML (10ML) SYRINGE FOR IV PUSH (FOR BLOOD PRESSURE SUPPORT)
PREFILLED_SYRINGE | INTRAVENOUS | Status: AC
  Filled 2020-09-13: qty 10

## 2020-09-13 MED ORDER — VANCOMYCIN HCL 1000 MG IV SOLR
INTRAVENOUS | Status: AC
  Filled 2020-09-13: qty 1000

## 2020-09-13 MED ORDER — ONDANSETRON HCL 4 MG/2ML IJ SOLN
INTRAMUSCULAR | Status: AC
  Filled 2020-09-13: qty 2

## 2020-09-13 MED ORDER — STERILE WATER FOR IRRIGATION IR SOLN
Status: DC | PRN
  Administered 2020-09-13: 2000 mL

## 2020-09-13 MED ORDER — BUPIVACAINE HCL (PF) 0.5 % IJ SOLN
INTRAMUSCULAR | Status: DC | PRN
  Administered 2020-09-13: 20 mL via PERINEURAL

## 2020-09-13 MED ORDER — PROPOFOL 10 MG/ML IV BOLUS
INTRAVENOUS | Status: AC
  Filled 2020-09-13: qty 40

## 2020-09-13 MED ORDER — MIDAZOLAM HCL 2 MG/2ML IJ SOLN
INTRAMUSCULAR | Status: DC | PRN
  Administered 2020-09-13: 2 mg via INTRAVENOUS

## 2020-09-13 MED ORDER — PROPOFOL 10 MG/ML IV BOLUS
INTRAVENOUS | Status: DC | PRN
  Administered 2020-09-13 (×2): 20 mg via INTRAVENOUS
  Administered 2020-09-13: 160 mg via INTRAVENOUS

## 2020-09-13 MED ORDER — CLINDAMYCIN PHOSPHATE 900 MG/50ML IV SOLN
900.0000 mg | INTRAVENOUS | Status: AC
  Administered 2020-09-13: 900 mg via INTRAVENOUS
  Filled 2020-09-13: qty 50

## 2020-09-13 MED ORDER — PHENYLEPHRINE HCL (PRESSORS) 10 MG/ML IV SOLN
INTRAVENOUS | Status: AC
  Filled 2020-09-13: qty 1

## 2020-09-13 MED ORDER — SCOPOLAMINE 1 MG/3DAYS TD PT72
1.0000 | MEDICATED_PATCH | TRANSDERMAL | Status: DC
  Administered 2020-09-13: 1 via TRANSDERMAL

## 2020-09-13 MED ORDER — ALBUMIN HUMAN 5 % IV SOLN: INTRAVENOUS | Status: DC | PRN

## 2020-09-13 MED ORDER — BUPIVACAINE LIPOSOME 1.3 % IJ SUSP
INTRAMUSCULAR | Status: DC | PRN
  Administered 2020-09-13: 10 mL via PERINEURAL

## 2020-09-13 MED ORDER — PHENYLEPHRINE HCL-NACL 10-0.9 MG/250ML-% IV SOLN
INTRAVENOUS | Status: DC | PRN
  Administered 2020-09-13: 50 ug/min via INTRAVENOUS

## 2020-09-13 MED ORDER — FENTANYL CITRATE (PF) 250 MCG/5ML IJ SOLN
INTRAMUSCULAR | Status: DC | PRN
  Administered 2020-09-13: 25 ug via INTRAVENOUS
  Administered 2020-09-13: 75 ug via INTRAVENOUS

## 2020-09-13 MED ORDER — MIDAZOLAM HCL 2 MG/2ML IJ SOLN
INTRAMUSCULAR | Status: AC
  Filled 2020-09-13: qty 2

## 2020-09-13 MED ORDER — DEXMEDETOMIDINE (PRECEDEX) IN NS 20 MCG/5ML (4 MCG/ML) IV SYRINGE
PREFILLED_SYRINGE | INTRAVENOUS | Status: DC | PRN
  Administered 2020-09-13: 20 ug via INTRAVENOUS

## 2020-09-13 MED ORDER — FENTANYL CITRATE (PF) 100 MCG/2ML IJ SOLN: 25.0000 ug | INTRAMUSCULAR | Status: DC | PRN

## 2020-09-13 MED ORDER — GLYCOPYRROLATE PF 0.2 MG/ML IJ SOSY
PREFILLED_SYRINGE | INTRAMUSCULAR | Status: AC
  Filled 2020-09-13: qty 1

## 2020-09-13 MED ORDER — TRANEXAMIC ACID-NACL 1000-0.7 MG/100ML-% IV SOLN
1000.0000 mg | INTRAVENOUS | Status: AC
  Administered 2020-09-13: 1000 mg via INTRAVENOUS
  Filled 2020-09-13: qty 100

## 2020-09-13 MED ORDER — EPHEDRINE SULFATE-NACL 50-0.9 MG/10ML-% IV SOSY
PREFILLED_SYRINGE | INTRAVENOUS | Status: DC | PRN
  Administered 2020-09-13 (×4): 10 mg via INTRAVENOUS
  Administered 2020-09-13 (×2): 5 mg via INTRAVENOUS

## 2020-09-13 MED ORDER — SCOPOLAMINE 1 MG/3DAYS TD PT72
MEDICATED_PATCH | TRANSDERMAL | Status: AC
  Filled 2020-09-13: qty 1

## 2020-09-13 MED ORDER — HYDROMORPHONE HCL 2 MG PO TABS: 2.0000 mg | ORAL_TABLET | ORAL | 0 refills | Status: DC | PRN

## 2020-09-13 MED ORDER — METHYLPREDNISOLONE SODIUM SUCC 125 MG IJ SOLR
INTRAMUSCULAR | Status: AC
  Filled 2020-09-13: qty 2

## 2020-09-13 MED ORDER — METHYLPREDNISOLONE SODIUM SUCC 125 MG IJ SOLR
INTRAMUSCULAR | Status: DC | PRN
  Administered 2020-09-13: 125 mg via INTRAVENOUS

## 2020-09-13 MED ORDER — ORAL CARE MOUTH RINSE: 15.0000 mL | Freq: Once | OROMUCOSAL | Status: AC

## 2020-09-13 MED ORDER — FENTANYL CITRATE (PF) 250 MCG/5ML IJ SOLN
INTRAMUSCULAR | Status: AC
  Filled 2020-09-13: qty 5

## 2020-09-13 MED ORDER — CYCLOBENZAPRINE HCL 10 MG PO TABS: ORAL_TABLET | ORAL | 1 refills | Status: DC

## 2020-09-13 MED ORDER — 0.9 % SODIUM CHLORIDE (POUR BTL) OPTIME
TOPICAL | Status: DC | PRN
  Administered 2020-09-13: 1000 mL

## 2020-09-13 MED ORDER — AMISULPRIDE (ANTIEMETIC) 5 MG/2ML IV SOLN: 10.0000 mg | Freq: Once | INTRAVENOUS | Status: DC | PRN

## 2020-09-13 MED ORDER — SUGAMMADEX SODIUM 200 MG/2ML IV SOLN
INTRAVENOUS | Status: DC | PRN
  Administered 2020-09-13: 250 mg via INTRAVENOUS

## 2020-09-13 MED ORDER — LACTATED RINGERS IV SOLN: INTRAVENOUS | Status: DC

## 2020-09-13 MED ORDER — ONDANSETRON HCL 4 MG/2ML IJ SOLN
INTRAMUSCULAR | Status: DC | PRN
  Administered 2020-09-13: 4 mg via INTRAVENOUS

## 2020-09-13 MED ORDER — LIDOCAINE 2% (20 MG/ML) 5 ML SYRINGE
INTRAMUSCULAR | Status: AC
  Filled 2020-09-13: qty 5

## 2020-09-13 MED ORDER — PHENYLEPHRINE HCL-NACL 10-0.9 MG/250ML-% IV SOLN
INTRAVENOUS | Status: AC
  Filled 2020-09-13: qty 250

## 2020-09-13 MED ORDER — ONDANSETRON HCL 4 MG/2ML IJ SOLN: 4.0000 mg | Freq: Once | INTRAMUSCULAR | Status: DC | PRN

## 2020-09-13 MED ORDER — LACTATED RINGERS IV BOLUS
500.0000 mL | Freq: Once | INTRAVENOUS | Status: AC
  Administered 2020-09-13: 500 mL via INTRAVENOUS

## 2020-09-13 MED ORDER — PHENYLEPHRINE 40 MCG/ML (10ML) SYRINGE FOR IV PUSH (FOR BLOOD PRESSURE SUPPORT)
PREFILLED_SYRINGE | INTRAVENOUS | Status: DC | PRN
  Administered 2020-09-13 (×3): 120 ug via INTRAVENOUS

## 2020-09-13 MED ORDER — LACTATED RINGERS IV BOLUS
250.0000 mL | Freq: Once | INTRAVENOUS | Status: AC
  Administered 2020-09-13: 250 mL via INTRAVENOUS

## 2020-09-13 MED ORDER — LIDOCAINE HCL (CARDIAC) PF 100 MG/5ML IV SOSY
PREFILLED_SYRINGE | INTRAVENOUS | Status: DC | PRN
  Administered 2020-09-13: 20 mg via INTRAVENOUS

## 2020-09-13 MED ORDER — ROCURONIUM BROMIDE 10 MG/ML (PF) SYRINGE
PREFILLED_SYRINGE | INTRAVENOUS | Status: AC
  Filled 2020-09-13: qty 10

## 2020-09-13 MED ORDER — GLYCOPYRROLATE 0.2 MG/ML IJ SOLN
INTRAMUSCULAR | Status: DC | PRN
  Administered 2020-09-13 (×2): .2 mg via INTRAVENOUS

## 2020-09-13 MED ORDER — EPHEDRINE 5 MG/ML INJ
INTRAVENOUS | Status: AC
  Filled 2020-09-13: qty 10

## 2020-09-13 MED ORDER — BUPIVACAINE LIPOSOME 1.3 % IJ SUSP: INTRAMUSCULAR | Status: DC | PRN

## 2020-09-13 MED ORDER — DEXAMETHASONE SODIUM PHOSPHATE 10 MG/ML IJ SOLN
INTRAMUSCULAR | Status: AC
  Filled 2020-09-13: qty 1

## 2020-09-13 MED ORDER — ROCURONIUM BROMIDE 10 MG/ML (PF) SYRINGE
PREFILLED_SYRINGE | INTRAVENOUS | Status: DC | PRN
  Administered 2020-09-13: 60 mg via INTRAVENOUS

## 2020-09-13 MED ORDER — CHLORHEXIDINE GLUCONATE 0.12 % MT SOLN
15.0000 mL | Freq: Once | OROMUCOSAL | Status: AC
  Administered 2020-09-13: 15 mL via OROMUCOSAL

## 2020-09-13 MED ORDER — ONDANSETRON HCL 4 MG PO TABS: 4.0000 mg | ORAL_TABLET | Freq: Three times a day (TID) | ORAL | 0 refills | Status: DC | PRN

## 2020-09-13 SURGICAL SUPPLY — 62 items
BAG ZIPLOCK 12X15 (MISCELLANEOUS) ×2 IMPLANT
BIT DRILL 2.0X128 (BIT) ×2 IMPLANT
BLADE SAW SGTL 83.5X18.5 (BLADE) ×2 IMPLANT
BODY TRUNION ECLIPSE 39 SL (Shoulder) ×2 IMPLANT
CEMENT BONE DEPUY (Cement) ×2 IMPLANT
COOLER ICEMAN CLASSIC (MISCELLANEOUS) IMPLANT
COVER BACK TABLE 60X90IN (DRAPES) ×2 IMPLANT
COVER SURGICAL LIGHT HANDLE (MISCELLANEOUS) ×2 IMPLANT
COVER WAND RF STERILE (DRAPES) IMPLANT
DERMABOND ADVANCED (GAUZE/BANDAGES/DRESSINGS) ×1
DERMABOND ADVANCED .7 DNX12 (GAUZE/BANDAGES/DRESSINGS) ×1 IMPLANT
DRAPE ORTHO SPLIT 77X108 STRL (DRAPES) ×2
DRAPE SHEET LG 3/4 BI-LAMINATE (DRAPES) ×2 IMPLANT
DRAPE SURG 17X11 SM STRL (DRAPES) ×2 IMPLANT
DRAPE SURG ORHT 6 SPLT 77X108 (DRAPES) ×2 IMPLANT
DRAPE U-SHAPE 47X51 STRL (DRAPES) ×2 IMPLANT
DRESSING AQUACEL AG SP 3.5X10 (GAUZE/BANDAGES/DRESSINGS) ×1 IMPLANT
DRSG AQUACEL AG ADV 3.5X10 (GAUZE/BANDAGES/DRESSINGS) ×2 IMPLANT
DRSG AQUACEL AG SP 3.5X10 (GAUZE/BANDAGES/DRESSINGS) ×2
DURAPREP 26ML APPLICATOR (WOUND CARE) ×2 IMPLANT
ELECT BLADE TIP CTD 4 INCH (ELECTRODE) ×2 IMPLANT
ELECT REM PT RETURN 15FT ADLT (MISCELLANEOUS) ×2 IMPLANT
FACESHIELD WRAPAROUND (MASK) ×8 IMPLANT
GLENOID WITH CLEAT MEDIUM (Shoulder) ×2 IMPLANT
GLOVE SS BIOGEL STRL SZ 7.5 (GLOVE) ×1 IMPLANT
GLOVE SUPERSENSE BIOGEL SZ 7.5 (GLOVE) ×1
GLOVE SURG ENC MOIS LTX SZ7.5 (GLOVE) ×2 IMPLANT
GLOVE SURG ENC MOIS LTX SZ8 (GLOVE) ×2 IMPLANT
GLOVE SURG MICRO LTX SZ7 (GLOVE) ×2 IMPLANT
GOWN STRL REUS W/TWL LRG LVL3 (GOWN DISPOSABLE) ×4 IMPLANT
HEAD HUMERAL ECLIPSE 39/16 (Shoulder) ×2 IMPLANT
IMPL ECLIPSE SPEEDCAP (Shoulder) ×1 IMPLANT
IMPLANT ECLIPSE SPEEDCAP (Shoulder) ×2 IMPLANT
KIT BASIN OR (CUSTOM PROCEDURE TRAY) ×2 IMPLANT
KIT TURNOVER KIT A (KITS) ×2 IMPLANT
MANIFOLD NEPTUNE II (INSTRUMENTS) ×2 IMPLANT
NEEDLE TAPERED W/ NITINOL LOOP (MISCELLANEOUS) IMPLANT
NS IRRIG 1000ML POUR BTL (IV SOLUTION) ×2 IMPLANT
PACK SHOULDER (CUSTOM PROCEDURE TRAY) ×2 IMPLANT
PAD COLD SHLDR WRAP-ON (PAD) ×2 IMPLANT
PIN NITINOL TARGETER 2.8 (PIN) IMPLANT
PIN SET MODULAR GLENOID SYSTEM (PIN) IMPLANT
PROTECTOR NERVE ULNAR (MISCELLANEOUS) ×2 IMPLANT
RESTRAINT HEAD UNIVERSAL NS (MISCELLANEOUS) ×2 IMPLANT
SCREW SM FOR ECLIPSE CAGE 30 (Screw) ×2 IMPLANT
SLING ARM FOAM STRAP LRG (SOFTGOODS) ×2 IMPLANT
SLING ARM FOAM STRAP MED (SOFTGOODS) IMPLANT
SMARTMIX MINI TOWER (MISCELLANEOUS)
SPONGE LAP 18X18 RF (DISPOSABLE) IMPLANT
SUCTION FRAZIER HANDLE 12FR (TUBING) ×1
SUCTION TUBE FRAZIER 12FR DISP (TUBING) ×1 IMPLANT
SUT FIBERWIRE #2 38 T-5 BLUE (SUTURE)
SUT MNCRL AB 3-0 PS2 18 (SUTURE) ×2 IMPLANT
SUT MON AB 2-0 CT1 36 (SUTURE) ×2 IMPLANT
SUT VIC AB 1 CT1 36 (SUTURE) ×2 IMPLANT
SUTURE FIBERWR #2 38 T-5 BLUE (SUTURE) IMPLANT
SUTURE TAPE 1.3 40 TPR END (SUTURE) IMPLANT
SUTURETAPE 1.3 40 TPR END (SUTURE)
TOWEL OR 17X26 10 PK STRL BLUE (TOWEL DISPOSABLE) ×2 IMPLANT
TOWEL OR NON WOVEN STRL DISP B (DISPOSABLE) ×2 IMPLANT
TOWER SMARTMIX MINI (MISCELLANEOUS) IMPLANT
WATER STERILE IRR 1000ML POUR (IV SOLUTION) ×4 IMPLANT

## 2020-09-13 NOTE — Transfer of Care (Signed)
Immediate Anesthesia Transfer of Care Note  Patient: Joan Ray  Procedure(s) Performed: TOTAL SHOULDER ARTHROPLASTY (Right Shoulder)  Patient Location: PACU  Anesthesia Type:GA combined with regional for post-op pain  Level of Consciousness: awake, drowsy and patient cooperative  Airway & Oxygen Therapy: Patient Spontanous Breathing and Patient connected to face mask oxygen  Post-op Assessment: Report given to RN and Post -op Vital signs reviewed and stable  Post vital signs: Reviewed and stable  Last Vitals:  Vitals Value Taken Time  BP 109/81 09/13/20 0958  Temp    Pulse 78 09/13/20 0959  Resp 19 09/13/20 0959  SpO2 94 % 09/13/20 0959  Vitals shown include unvalidated device data.  Last Pain:  Vitals:   09/13/20 0622  TempSrc: Oral  PainSc:       Patients Stated Pain Goal: 4 (81/77/11 6579)  Complications: No complications documented.

## 2020-09-13 NOTE — Anesthesia Procedure Notes (Signed)
Anesthesia Regional Block: Interscalene brachial plexus block   Pre-Anesthetic Checklist: ,, timeout performed, Correct Patient, Correct Site, Correct Laterality, Correct Procedure, Correct Position, site marked, Risks and benefits discussed,  Surgical consent,  Pre-op evaluation,  At surgeon's request and post-op pain management  Laterality: Right  Prep: chloraprep       Needles:  Injection technique: Single-shot      Additional Needles:   Procedures:,,,, ultrasound used (permanent image in chart),,,,  Motor weakness within 5 minutes.  Narrative:  Start time: 09/13/2020 7:28 AM End time: 09/13/2020 7:33 AM Injection made incrementally with aspirations every 5 mL.  Performed by: Personally  Anesthesiologist: Josephine Igo, MD  Additional Notes: Timeout performed. Patient sedated. Relevant anatomy ID'd using [place]. Incremental 2-59ml injection of LA with frequent aspiration. Patient tolerated procedure well.        Right Interscalene Block

## 2020-09-13 NOTE — Op Note (Signed)
09/13/2020  9:52 AM  PATIENT:   Joan Ray  60 y.o. female  PRE-OPERATIVE DIAGNOSIS:  Right shoulder osteoarthritis  POST-OPERATIVE DIAGNOSIS: Same  PROCEDURE: Right shoulder anatomic arthroplasty utilizing a size 39 trunnion with a small cage screw, 39/16 humeral head, medium glenoid  SURGEON:  Faaris Arizpe, Metta Clines M.D.  ASSISTANTS: Jenetta Loges, PA-C  ANESTHESIA:   General endotracheal and interscalene block with Exparel  EBL: 150 cc  SPECIMEN: None  Drains: None   PATIENT DISPOSITION:  PACU - hemodynamically stable.    PLAN OF CARE: Discharge to home after PACU  Brief history:  Patient is a 60 year old female who has been followed for chronic bilateral shoulder pain, status post a previous left anatomic shoulder arthroplasty who has been more recently seen for progressive increasing right shoulder pain and functional rotations related to severe osteoarthritis.  Due to her increasing difficulties and failure to respond to conservative management she is brought to the operating this time for planned right shoulder anatomic arthroplasty.  Preoperatively, I counseled the patient regarding treatment options and risks versus benefits thereof.  Possible surgical complications were all reviewed including potential for bleeding, infection, neurovascular injury, persistent pain, loss of motion, anesthetic complication, failure of the implant, and possible need for additional surgery. They understand and accept and agrees with our planned procedure.   Procedure in detail:  After undergoing routine preop evaluation the patient received prophylactic antibiotics and interscalene block with Exparel was established in the holding area by the anesthesia department.  The patient was subsequently placed supine on the operating table and underwent the smooth induction of a general endotracheal anesthesia.  Subsequently placed in the beachchair position and appropriately padded protected.   The right shoulder girdle region was sterilely prepped and draped in standard fashion.  Timeout was called.  A deltopectoral approach to the right shoulder is made through a 10 cm incision.  Skin flaps were elevated dissection carried deeply and the deltopectoral interval was developed from proximal to distal with the vein taken laterally.  The upper centimeter half of the pectoralis major tendon was then tenotomized for exposure.  Conjoined tendon was mobilized and retracted medially.  Adhesions were divided from beneath the deltoid.  The long head biceps tendon was then tenodesed at the upper border the pectoralis major tendon and the proximal segment was then unroofed and excised.  The rotator cuff was then split along the rotator interval to the base of the coracoid and the insertion of the subscapularis was identified and an oscillating saw was then used to perform a lesser tuberosity osteotomy removing a thin wafer of bone and the subscap was then tagged and reflected medially.  Capsular attachments were then divided from the anterior and infra margins of the humeral neck with a very large osteophyte carefully dissected free and the humeral head was delivered through the wound.  An extra medullary guide was then used to outline our proposed humeral head resection which was performed with an oscillating saw at the native retroversion of approximate 30 degrees.  We then used a rondure to remove the large osteophyte on the humeral neck.  The metaphysis was sized at a 39 and preliminary preparation was performed with the metaphyseal reamer and the cage screw depth was measured for small.  A metal cap was then placed over the cut proximal humeral surface at this time we exposed the glenoid with appropriate retractors.  Of note a number of very large loose bodies were evacuated and a large  osteophyte was removed from the posterior aspect of the joint.  We then performed a circumferential labral resection gaining  complete visualization of the glenoid.  The glenoid was then sized for a medium a guidepin was placed into the center of the glenoid and then was then reamed to a stable subchondral bone bed.  Glenoid preparation was completed with the central drill followed by the superior and inferior peg and slot respectively and the glenoid was then broached and the trial showed excellent fit and fixation.  This point the glenoid was meticulously cleaned and dried.  Cement was mixed into the superior and inferior peg and slot respectively and then bone graft was packed around the central peg and the glenoid was then impacted with excellent fit and fixation.  We then returned our attention back to the proximal humerus where our 39 trunnion was then impacted and our cage screw was packed with bone graft and the cage screw was then inserted with excellent purchase and fixation.  We then performed a series of trial reductions and ultimately felt that the 39 x 16 head gave [place] the best soft tissue balance with approximately 50% translation across the glenoid good elasticity good mobility good stability.  Trial was then removed.  The Morse taper was cleaned and dried.  The final 39 x 16 head was then impacted.  Final reduction was then performed.  We are very pleased with overall motion stability and soft tissue balance.  We then confirmed mobilization of the subscapularis.  Replaced 2 Medial Row suture anchors to be used in conjunction with the prior surgery placed in the eyelid on the collar of our trunnion.  With this had 3 medial row anchors which we then passed through the bone tendon junction of the LTO at the appropriate levels.  The suture limbs were then shuttled into 2 lateral anchors in an alternating fashion creating a double row repair allowing excellent apposition of the LTO to the donor site on the metaphysis.  The rotator interval was then repaired with figure-of-eight suture tape sutures.  Upon completion the arm  easily achieved 45 degrees of external rotation without excessive tension on the subscap repair.  The wound was then copiously irrigated.  Hemostasis was obtained.  The deltopectoral interval was reapproximated with a series of figure-of-eight #1 Vicryl sutures.  2-0 Monocryl used with subcu layer and intracuticular 3-0 Monocryl for the skin followed by Dermabond and Aquacel dressing.  Right arm was then placed in a sling.  Patient was awakened, extubated, and taken to the recovery room in stable condition.  Jenetta Loges, PA-C was utilized as an Environmental consultant throughout this case, essential for help with positioning the patient, positioning extremity, tissue manipulation, implantation of the prosthesis, suture management, wound closure, and intraoperative decision-making.  Marin Shutter MD   Contact # [phone]

## 2020-09-13 NOTE — H&P (Signed)
Joan Ray    Chief Complaint: Right shoulder osteoarthritis HPI: The patient is a 60 y.o. female with chronic and progressive increasing right shoulder pain related to severe osteoarthritis.  Due to her increasing functional imitations and failure to respond to conservative management she is brought to the operating this time for planned right anatomic total shoulder arthroplasty  Past Medical History:  Diagnosis Date  . Anemia    h/o of   Last infusion of iron  2008  . Anxiety   . Arthritis    osteo   . Complication of anesthesia   . Constipation   . Depression    d/t decreased activity  . GERD (gastroesophageal reflux disease)   . Headache(784.0)    migraines  . High triglycerides   . Hypertension   . MS (multiple sclerosis) ([place])   . Neuromuscular disorder ([place])    mutliple sclerosis--dx 10/2008  . PONV (postoperative nausea and vomiting)   . Pre-diabetes   . Restless leg syndrome   . Sleep apnea    mild sleep apnea, no CPAP  . Wears glasses   . Wears glasses     Past Surgical History:  Procedure Laterality Date  . ANTERIOR CERVICAL DECOMP/DISCECTOMY FUSION N/A 12/21/2014   Procedure: ANTERIOR CERVICAL DECOMPRESSION/DISCECTOMY FUSION 2 LEVELS C5-7;  Surgeon: Melina Schools, MD;  Location: [facility];  Service: Orthopedics;  Laterality: N/A;  . BLADDER SUSPENSION     occas  blood in urine  . CARPAL TUNNEL RELEASE    . ENDOMETRIAL ABLATION  2010  . FOOT ARTHRODESIS Right 07/28/2013   Procedure: ARTHRODESIS RIGHT MIDFOOT OF THE FIRST -  THIRD TARSOMETATARSAL JOINTS ;  Surgeon: Wylene Simmer, MD;  Location: [facility];  Service: Orthopedics;  Laterality: Right;  . GASTROCNEMIUS RECESSION Right 07/28/2013   Procedure: RIGHT GASTROCNEMIUS RECESSION;  Surgeon: Wylene Simmer, MD;  Location: [facility];  Service: Orthopedics;  Laterality: Right;  . JOINT REPLACEMENT  2012   lt total knee  . KNEE ARTHROSCOPY     bilateral  in 2010, has multiple scopes   . MEDIAL PARTIAL KNEE REPLACEMENT     right  . nerve blocks    . SHOULDER ARTHROSCOPY Right   . SPINAL FUSION  05-05-2013  . stress fracture     right 2-3 toe  . TENDON RELEASE Right   . TOTAL KNEE ARTHROPLASTY  2008   partial rt   . TOTAL SHOULDER ARTHROPLASTY Left 03/24/2019   Procedure: TOTAL SHOULDER ARTHROPLASTY;  Surgeon: Justice Britain, MD;  Location: [facility];  Service: Orthopedics;  Laterality: Left;  151min     Family History  Problem Relation Age of Onset  . Heart disease Mother   . Cancer Mother   . Hypertension Mother   . Depression Mother   . Heart disease Other   . Hypertension Maternal Grandmother   . Diabetes Maternal Grandmother   . Kidney disease Maternal Grandmother   . Rheum arthritis Maternal Grandmother   . Depression Maternal Grandmother   . Hypertension Paternal Grandmother   . Kidney disease Maternal Grandfather   . Depression Maternal Grandfather   . Depression Brother     Social History:  reports that she has never smoked. She has never used smokeless tobacco. She reports current alcohol use. She reports that she does not use drugs.   Medications Prior to Admission  Medication Sig Dispense Refill  . acetaminophen (TYLENOL) 650 MG CR tablet Take 1,300 mg by mouth every 8 (eight)  hours as needed for pain. Takes 1-2 times per day    . CELEBREX 200 MG capsule Take 1 capsule (200 mg total) by mouth 2 (two) times daily. 180 capsule 1  . cetirizine (ZYRTEC) 10 MG tablet Take 10 mg by mouth daily.    . Cholecalciferol (VITAMIN D3) 125 MCG (5000 UT) CAPS Take 5,000 Units by mouth daily.     . clonazePAM (KLONOPIN) 0.5 MG tablet TAKE ONE TABLET BY MOUTH TWICE A DAY AS NEEDED (Patient taking differently: Take 0.5 mg by mouth at bedtime and may repeat dose one time if needed.) 60 tablet 5  . cyclobenzaprine (FLEXERIL) 10 MG tablet TAKE ONE TABLET BY MOUTH THREE TIMES A DAY AS NEEDED 270 tablet 0  . DULoxetine (CYMBALTA) 60 MG capsule TAKE TWO CAPSULES BY  MOUTH DAILY (Patient taking differently: Take 60 mg by mouth 2 (two) times daily.) 180 capsule 3  . esomeprazole (NEXIUM) 20 MG capsule Take 20 mg by mouth in the morning and at bedtime.    . fenofibrate (TRICOR) 145 MG tablet Take 145 mg by mouth daily.    . fluticasone (FLONASE) 50 MCG/ACT nasal spray Place 2 sprays into both nostrils at bedtime.     . hydrochlorothiazide (HYDRODIURIL) 25 MG tablet Take 25 mg by mouth daily.    . Melatonin 10 MG CAPS Take 10 mg by mouth at bedtime.     . Olopatadine HCl 0.6 % SOLN Place 2 sprays into the nose daily.    . pregabalin (LYRICA) 150 MG capsule Take 1 capsule (150 mg total) by mouth 2 (two) times daily. 180 capsule 3  . rizatriptan (MAXALT) 10 MG tablet Take 10 mg by mouth every 2 (two) hours as needed for migraine. May repeat in 2 hours if needed    . Semaglutide (RYBELSUS) 3 MG TABS Take 3 mg by mouth daily.    Marland Kitchen senna-docusate (SENOKOT-S) 8.6-50 MG tablet Take 2 tablets by mouth 2 (two) times daily.    . verapamil (CALAN-SR) 180 MG CR tablet Take 1 tablet (180 mg total) by mouth at bedtime. 90 tablet 3  . zaleplon (SONATA) 10 MG capsule Take 10 mg by mouth at bedtime.    Marland Kitchen EPINEPHrine 0.3 mg/0.3 mL IJ SOAJ injection Inject 0.3 mg into the muscle as needed for anaphylaxis.    . natalizumab (TYSABRI) 300 MG/15ML injection Inject 300 mg into the vein every 6 (six) weeks. Takes for MS    . WEGOVY 1 MG/0.5ML SOAJ Inject 1 mg into the skin every Friday.       Physical Exam: Right shoulder demonstrates painful and guarded motion as noted at her recent office visit.  Patient did apply topical benzyl peroxide prior to surgery as per protocol and developed a rash.  She discontinued the medication yesterday.  She still has some very minimal erythematous papules about the shoulder which are resolving.  No skin breakdown or compromise.  She otherwise remains neurovascular intact.  Plain radiographs confirm severe osteoarthritis.  Vitals  Temp:  [98.2 F  (36.8 C)] 98.2 F (36.8 C) (03/10 0622) Pulse Rate:  [97] 97 (03/10 0622) Resp:  [21] 21 (03/10 0622) BP: (122)/(87) 122/87 (03/10 0622) SpO2:  [97 %] 97 % (03/10 0622) Weight:  [122 kg] 122 kg (03/10 0606)  Assessment/Plan  Impression: Right shoulder osteoarthritis  Plan of Action: Procedure(s): TOTAL SHOULDER ARTHROPLASTY  Vontae Court M Henya Aguallo 09/13/2020, 7:30 AM Contact # [phone]

## 2020-09-13 NOTE — Discharge Instructions (Signed)
 Kevin M. Supple, M.D., F.A.A.O.S. Orthopaedic Surgery Specializing in Arthroscopic and Reconstructive Surgery of the Shoulder [phone] [address] - [place], [place] [address] - Fax [phone]   POST-OP TOTAL SHOULDER REPLACEMENT INSTRUCTIONS  1. Follow up in the office for your first post-op appointment 10-14 days from the date of your surgery. If you do not already have a scheduled appointment, our office will contact you to schedule.  2. The bandage over your incision is waterproof. You may begin showering with this dressing on. You may leave this dressing on until first follow up appointment within 2 weeks. We prefer you leave this dressing in place until follow up however after 5-7 days if you are having itching or skin irritation and would like to remove it you may do so. Go slow and tug at the borders gently to break the bond the dressing has with the skin. At this point if there is no drainage it is okay to go without a bandage or you may cover it with a light guaze and tape. You can also expect significant bruising around your shoulder that will drift down your arm and into your chest wall. This is very normal and should resolve over several days.   3. Wear your sling/immobilizer at all times except to perform the exercises below or to occasionally let your arm dangle by your side to stretch your elbow. You also need to sleep in your sling immobilizer until instructed otherwise. It is ok to remove your sling if you are sitting in a controlled environment and allow your arm to rest in a position of comfort by your side or on your lap with pillows to give your neck and skin a break from the sling. You may remove it to allow arm to dangle by side to shower. If you are up walking around and when you go to sleep at night you need to wear it.  4. Range of motion to your elbow, wrist, and hand are encouraged 3-5 times daily. Exercise to your hand and fingers helps to reduce  swelling you may experience.   5. Prescriptions for a pain medication and a muscle relaxant are provided for you. It is recommended that if you are experiencing pain that you pain medication alone is not controlling, add the muscle relaxant along with the pain medication which can give additional pain relief. The first 1-2 days is generally the most severe of your pain and then should gradually decrease. As your pain lessens it is recommended that you decrease your use of the pain medications to an "as needed basis'" only and to always comply with the recommended dosages of the pain medications.  6. Pain medications can produce constipation along with their use. If you experience this, the use of an over the counter stool softener or laxative daily is recommended.   7. For additional questions or concerns, please do not hesitate to call the office. If after hours there is an answering service to forward your concerns to the physician on call.  8.Pain control following an exparel block  To help control your post-operative pain you received a nerve block  performed with Exparel which is a long acting anesthetic (numbing agent) which can provide pain relief and sensations of numbness (and relief of pain) in the operative shoulder and arm for up to 3 days. Sometimes it provides mixed relief, meaning you may still have numbness in certain areas of the arm but can still be able to   move  parts of that arm, hand, and fingers. We recommend that your prescribed pain medications  be used as needed. We do not feel it is necessary to "pre medicate" and "stay ahead" of pain.  Taking narcotic pain medications when you are not having any pain can lead to unnecessary and potentially dangerous side effects.    9. Use the ice machine as much as possible in the first 5-7 days from surgery, then you can wean its use to as needed. The ice typically needs to be replaced every 6 hours, instead of ice you can actually freeze  water bottles to put in the cooler and then fill water around them to avoid having to purchase ice. You can have spare water bottles freezing to allow you to rotate them once they have melted. Try to have a thin shirt or light cloth or towel under the ice wrap to protect your skin.   FOR ADDITIONAL INFO ON ICE MACHINE AND INSTRUCTIONS GO TO THE WEBSITE AT  https://www.[employer].com/products/donjoy/donjoy-iceman-[email or web address]  10.  We recommend that you avoid any dental work or cleaning in the first 3 months following your joint replacement. This is to help minimize the possibility of infection from the bacteria in your mouth that enters your bloodstream during dental work. We also recommend that you take an antibiotic prior to your dental work for the first year after your shoulder replacement to further help reduce that risk. Please simply contact our office for antibiotics to be sent to your pharmacy prior to dental work.  11. Dental Antibiotics:  In most cases prophylactic antibiotics for Dental procdeures after total joint surgery are not necessary.  Exceptions are as follows:  1. History of prior total joint infection  2. Severely immunocompromised (Organ Transplant, cancer chemotherapy, Rheumatoid biologic meds such as Humera)  3. Poorly controlled diabetes (A1C &gt; 8.0, blood glucose over 200)  If you have one of these conditions, contact your surgeon for an antibiotic prescription, prior to your dental procedure.   POST-OP EXERCISES  Pendulum Exercises  Perform pendulum exercises while standing and bending at the waist. Support your uninvolved arm on a table or chair and allow your operated arm to hang freely. Make sure to do these exercises passively - not using you shoulder muscles. These exercises can be performed once your nerve block effects have worn off.  Repeat 20 times. Do 3 sessions per day.     

## 2020-09-13 NOTE — Anesthesia Procedure Notes (Signed)
Procedure Name: Intubation Date/Time: 09/13/2020 7:42 AM Performed by: Raenette Rover, CRNA Pre-anesthesia Checklist: Patient identified, Emergency Drugs available, Suction available and Patient being monitored Patient Re-evaluated:Patient Re-evaluated prior to induction Oxygen Delivery Method: Circle system utilized Preoxygenation: Pre-oxygenation with 100% oxygen Induction Type: IV induction Ventilation: Mask ventilation without difficulty Laryngoscope Size: Mac and 3 Grade View: Grade I Tube type: Oral Tube size: 7.0 mm Number of attempts: 1 Airway Equipment and Method: Stylet Placement Confirmation: ETT inserted through vocal cords under direct vision,  positive ETCO2 and breath sounds checked- equal and bilateral Secured at: 21 cm Tube secured with: Tape Dental Injury: Teeth and Oropharynx as per pre-operative assessment

## 2020-09-13 NOTE — Anesthesia Postprocedure Evaluation (Signed)
Anesthesia Post Note  Patient: Joan Ray  Procedure(s) Performed: TOTAL SHOULDER ARTHROPLASTY (Right Shoulder)     Patient location during evaluation: PACU Anesthesia Type: General Level of consciousness: awake and alert and oriented Pain management: pain level controlled Vital Signs Assessment: post-procedure vital signs reviewed and stable Respiratory status: spontaneous breathing, nonlabored ventilation and respiratory function stable Cardiovascular status: blood pressure returned to baseline and stable Postop Assessment: no apparent nausea or vomiting Anesthetic complications: no   No complications documented.  Last Vitals:  Vitals:   09/13/20 1015 09/13/20 1030  BP: 119/80 136/84  Pulse: 77 73  Resp: 13 13  Temp:  36.4 C  SpO2: 92% 92%    Last Pain:  Vitals:   09/13/20 1015  TempSrc:   PainSc: 0-No pain                 Renarda Mullinix A.

## 2020-09-13 NOTE — Evaluation (Signed)
Occupational Therapy Evaluation Patient Details Name: Joan Ray MRN: [id number] DOB: 08/23/1960 Today's Date: 09/13/2020    History of Present Illness Patient is a 60 year old female admitted 3/10 f or R total shoulder arthroplasty. PMH includes MS, HTN, L shoulder replacement   Clinical Impression   s/p shoulder replacement without functional use of right dominant upper extremity secondary to effects of surgery and interscalene block and shoulder precautions. Therapist provided education and instruction to patient and spouse in regards to exercises, precautions, positioning, donning upper extremity clothing and bathing while maintaining shoulder precautions, ice and edema management and donning/doffing sling. Patient and spouse verbalized understanding and demonstrated as needed. Patient needed assistance to donn shirt, underwear, pants, socks and shoes and provided with instruction on compensatory strategies to perform ADLs. Patient to follow up with MD for further therapy needs.      Follow Up Recommendations  Follow surgeon's recommendation for DC plan and follow-up therapies    Equipment Recommendations  None recommended by OT       Precautions / Restrictions Precautions Precautions: Shoulder Type of Shoulder Precautions: May come out of sling if sitting in controlled environment. ie while watching tv, eating etc to give neck and skin break from sling. Please sleep in sling though until 4 weeks post op. Ok to use operative arm to assist in feeding, bathing, ADL's. New PROM restrictions (8/18) for use in hygiene and ADL only ER 20 ABD 45 FE 60 Pendulums are to be gentle and are the preferred exercise to be instructed for patients to perform at home.( Along with elbow wrist and hand exercise) Shoulder Interventions: Shoulder sling/immobilizer;Off for dressing/bathing/exercises Precaution Booklet Issued: Yes (comment) Required Braces or Orthoses: Sling Restrictions Weight Bearing  Restrictions: Yes RUE Weight Bearing: Non weight bearing      Mobility Bed Mobility               General bed mobility comments: in chair    Transfers Overall transfer level: Needs assistance Equipment used: None Transfers: Sit to/from Stand Sit to Stand: Supervision         General transfer comment: safety    Balance Overall balance assessment: Mild deficits observed, not formally tested                                         ADL either performed or assessed with clinical judgement   ADL Overall ADL's : Needs assistance/impaired     Grooming: Modified independent   Upper Body Bathing: Minimal assistance;Sitting   Lower Body Bathing: Minimal assistance;Sitting/lateral leans;Sit to/from stand   Upper Body Dressing : Moderate assistance;Cueing for sequencing;Cueing for compensatory techniques;Cueing for UE precautions;Standing Upper Body Dressing Details (indicate cue type and reason): spouse assisting at baseline, provide education re: technique for threading R UE Lower Body Dressing: Moderate assistance;Sitting/lateral leans;Sit to/from stand Lower Body Dressing Details (indicate cue type and reason): to initiate threading LEs and to pull up clothing over R hip. patient was having help from spouse with this pre surgery Toilet Transfer: Supervision/safety Toilet Transfer Details (indicate cue type and reason): STS from chair Toileting- Clothing Manipulation and Hygiene: Moderate assistance       Functional mobility during ADLs: Supervision/safety General ADL Comments: patient and spouse educated on compensatory strategies for ADLs in order to maintain shoulder precautions. Verbalized and demonstrated as needed  Pertinent Vitals/Pain Pain Assessment: Faces Faces Pain Scale: Hurts a little bit Pain Location: R shoulder Pain Descriptors / Indicators: Heaviness;Numbness Pain Intervention(s): Monitored during session      Hand Dominance Right   Extremity/Trunk Assessment Upper Extremity Assessment Upper Extremity Assessment: RUE deficits/detail RUE Deficits / Details: + nerve block   Lower Extremity Assessment Lower Extremity Assessment: Overall WFL for tasks assessed       Communication Communication Communication: No difficulties   Cognition Arousal/Alertness: Awake/alert Behavior During Therapy: WFL for tasks assessed/performed Overall Cognitive Status: Within Functional Limits for tasks assessed                                        Exercises Exercises: Shoulder   Shoulder Instructions Shoulder Instructions Donning/doffing shirt without moving shoulder: Moderate assistance;Caregiver independent with task;Patient able to independently direct caregiver Method for sponge bathing under operated UE: Minimal assistance;Caregiver independent with task;Patient able to independently direct caregiver Donning/doffing sling/immobilizer: Patient able to independently direct caregiver Correct positioning of sling/immobilizer: Patient able to independently direct caregiver;Caregiver independent with task Pendulum exercises (written home exercise program): Patient able to independently direct caregiver;Caregiver independent with task ROM for elbow, wrist and digits of operated UE: Patient able to independently direct caregiver;Caregiver independent with task Sling wearing schedule (on at all times/off for ADL's): Patient able to independently direct caregiver;Caregiver independent with task Proper positioning of operated UE when showering: Patient able to independently direct caregiver;Caregiver independent with task Positioning of UE while sleeping: Patient able to independently direct caregiver;Caregiver independent with task    Home Living Family/patient expects to be discharged to:: Private residence Living Arrangements: Spouse/significant other Available Help at Discharge:  Family Type of Home: House Home Access: Stairs to enter Technical brewer of Steps: 4   Home Layout: One level     Bathroom Shower/Tub: Occupational psychologist: [place]: Grab bars - tub/shower;Shower seat - built in;Grab bars - toilet          Prior Functioning/Environment Level of Independence: Needs assistance  Gait / Transfers Assistance Needed: no AD for ambulation ADL's / Homemaking Assistance Needed: due to pain patient was needed help for UB/LB dressing from spouse            OT Problem List: Impaired UE functional use;Pain;Obesity;Decreased knowledge of precautions         OT Goals(Current goals can be found in the care plan section) Acute Rehab OT Goals Patient Stated Goal: home OT Goal Formulation: All assessment and education complete, DC therapy   AM-PAC OT "6 Clicks" Daily Activity     Outcome Measure Help from another person eating meals?: A Little Help from another person taking care of personal grooming?: A Little Help from another person toileting, which includes using toliet, bedpan, or urinal?: A Lot Help from another person bathing (including washing, rinsing, drying)?: A Lot Help from another person to put on and taking off regular upper body clothing?: A Lot Help from another person to put on and taking off regular lower body clothing?: A Lot 6 Click Score: 14   End of Session Equipment Utilized During Treatment: Other (comment) (sling) Nurse Communication: Mobility status  Activity Tolerance: Patient tolerated treatment well Patient left: in chair;with call bell/phone within reach;with family/visitor present;with nursing/sitter in room  OT Visit Diagnosis: Pain Pain - Right/Left: Right Pain - part of body: Shoulder  Time: 0258-5277 OT Time Calculation (min): 39 min Charges:  OT General Charges $OT Visit: 1 Visit OT Evaluation $OT Eval Low Complexity: 1 Low OT Treatments $Self Care/Home  Management : 23-37 mins  Delbert Phenix OT OT pager: [phone]   Rosemary Holms 09/13/2020, 11:56 AM

## 2020-09-17 ENCOUNTER — Encounter (HOSPITAL_COMMUNITY): Payer: Self-pay | Admitting: Orthopedic Surgery

## 2020-10-04 ENCOUNTER — Telehealth: Payer: Self-pay | Admitting: *Deleted

## 2020-10-04 ENCOUNTER — Other Ambulatory Visit: Payer: Self-pay

## 2020-10-04 DIAGNOSIS — Z79899 Other long term (current) drug therapy: Secondary | ICD-10-CM

## 2020-10-04 DIAGNOSIS — G35 Multiple sclerosis: Secondary | ICD-10-CM

## 2020-10-04 NOTE — Telephone Encounter (Signed)
Placed JCV lab in [facility] lock box for routine lab pick up. Results pending. 

## 2020-10-04 NOTE — Addendum Note (Signed)
Addended by: Inis Sizer D on: 10/04/2020 11:02 AM   Modules accepted: Orders

## 2020-10-11 LAB — STRATIFY JCV AB (W/ INDEX) W/ RFLX
Index Value: 0.28 — ABNORMAL HIGH
Stratify JCV (TM) Ab w/Reflex Inhibition: UNDETERMINED — AB

## 2020-10-11 LAB — RFLX STRATIFY JCV (TM) AB INHIBITION: JCV Antibody by Inhibition: POSITIVE

## 2020-10-23 NOTE — Telephone Encounter (Signed)
JCV ab indeterminate, index: 0.28. Inhibition assay: positive.

## 2020-10-31 ENCOUNTER — Encounter: Payer: Self-pay | Admitting: Neurology

## 2020-10-31 ENCOUNTER — Ambulatory Visit: Payer: 59 | Admitting: Neurology

## 2020-10-31 VITALS — BP 157/86 | HR 79 | Ht 68.0 in | Wt 270.0 lb

## 2020-10-31 DIAGNOSIS — G35 Multiple sclerosis: Secondary | ICD-10-CM

## 2020-10-31 DIAGNOSIS — M2550 Pain in unspecified joint: Secondary | ICD-10-CM

## 2020-10-31 DIAGNOSIS — Z79899 Other long term (current) drug therapy: Secondary | ICD-10-CM

## 2020-10-31 DIAGNOSIS — R208 Other disturbances of skin sensation: Secondary | ICD-10-CM

## 2020-10-31 DIAGNOSIS — R269 Unspecified abnormalities of gait and mobility: Secondary | ICD-10-CM

## 2020-10-31 MED ORDER — TIZANIDINE HCL 4 MG PO TABS: 4.0000 mg | ORAL_TABLET | Freq: Three times a day (TID) | ORAL | 11 refills | Status: DC

## 2020-10-31 NOTE — Progress Notes (Signed)
[facility]  PATIENT: Joan Ray DOB: [birth year]  REFERRING DOCTOR OR PCP:  Melina Schools [phone]) [name] (PCP) SOURCE: Patient, MRI images on PACS and CD and notes from PCP and ortho  _________________________________   HISTORICAL  CHIEF COMPLAINT:  Chief Complaint  Patient presents with  . Follow-up    New room,alone. Last seen 04/25/2020. On Tysabri for MS. Last: 10/04/20 and next: 11/25/20. Last JCV 10/04/20 indeterminate, index: 0.28. Inhibition assay: negative.  Having increased spasms in her back.    HISTORY OF PRESENT ILLNESS:  Joan Ray is a 60 y.o. right-handed patient with relapsing remitting MS diagnosed in 2010.     Update 10/31/2020 For MS, she is on Tysabri and tolerates it well.   She had a positive JCV Ab increasing to 0.9 when last checked but last two was 0.35 (still positive) and 0.28 (negative).   We increased the interval to q6 weeks.  The MS is stable but she feels more fatigue with the longer interval.    The only other DMT she has bene on is Avonex.    She continues to note a lot of tight myalgias in her hands, aching myalgia in her legs and a frostbite dysesthetic sensation in her feet.  She is on Lyrica 150 mg bid and duloxetine 120 mg for dysesthesias.  Lamotrigine was not well tolerated (tongue burning).        Oxcarbazepine 150 mg po qAm and 300 mg po qHS did not help and she felt cognitively off.      She had her shoulder surgery recently.  She is noting more spasms in her right arm.   Cyclobenzaprine is no longer helping.   Clonazepam helped the spasms.      He HgbA1c ws 6.0 and she is trying to lose weight and lost 20 pounds on semaglutide but gained it back.   She is now on Saxenda.  She had CTR on the left and had right shoulder replacement March 2022 and left March 202  MRI cervical spine 05/13/2019 shows C5-C7 ACDF and DJD at other levels, worse at C7T1 with right foraminal narrowing.     .        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 She also had pain in the left eye. She went to an ophthalmologist who did some tests and referred her or an MRI. The MRI was abnormal, consistent with MS and she was referred to Dr. Maye Hides.   Usually, she was placed on weekly Avonex injections. She did not like the way that she felt on the injections. Additionally, an MRI of the brain showed that she had 2 new lesions. Therefore, she was switched to Tysabri in 2011.     On Tysabri, she has had no definite exacerbations, no change in MRI. Additionally she tolerates the medication very well. She has had some low positive JCV antibody titers. We do not have the actual numbers.    She switched from Dr. Starleen Blue to Dr. William Hamburger in [employer] in 2013. She is interested in changing to an [place].    Last MRI was early 2016 at [facility] in [place].     Spine/limb pain history:    She reports having  L5-S1 fusion in October 2014 and C5-C7 fusion June 2016.   Although some symptoms improved after surgery, she continues to have a lot of difficulties with neck  pain, arm pain, back pain and leg pain.    She was placed on Lyrica for her pain and was titrated to 200 mg twice a day but could not tolerate that dose. She has since been reduced to 100 mg by mouth twice a day and tolerates that dose but continues to have pain.      She reports some axial neck and lower back pain but the dysesthetic pain in the hands and feet is more troublesome.    Both hands have the dysesthetic pain but in the legs, the right foot is much worse than the left.   She gets some benefit from Lyrica.   She felt her pain had been better on Cymbalta than on Effexor. However, in 2013 when she was having more neurologic issues the Cymbalta was discontinued.   In the right foot, the pain is most intense on the top of the foot and towards the ankle but not above the ankle.    I personally  reviewed the MRI of the cervical spine from 10/07/2014.   It shows degenerative changes at C5-C6 resulting in left foraminal narrowing and changes at C6-C7 resulting right foraminal narrowing. The spinal cord appears normal. Plain films done after surgery showed a C5-C7 fusion and lumbar plain films showed the L5-S1 fusion.  STUDIES: Nerve conduction and EMG study 07/2019 showed bilateral carpal tunnel syndrome, worse on the left and a mild left chronic C7 radiculopathy without any active features.  We discussed referring her to a hand surgeon as she has noted increasing pain over the past couple months.  Additionally, the carpal tunnel syndrome on the left has worsened compared to the 2018 NCV/EMG study  MRI Cervical spine 05/13/2019 showed  That the spinal cord appears normal.   ACDF at C5-C7.  This has occurred since the 07/02/2010 MRI. .   Mild degenerative changes at C2-C3, C3-C4 and C4-C5, T1-T2 and T2-T3 that do not lead to significant foraminal narrowing or spinal stenosis.  At C7-T1, there is moderate right foraminal narrowing but no definite nerve root compression.   Degenerative changes at C7-T1 and T1-T2 have progressed compared to the 2011 MRI.  MRI Brain 04/13/2018 showed multiple T2/FLAIR hyperintense foci in the hemispheres in a pattern and configuration consistent with chronic demyelinating plaque associated with multiple sclerosis.  None of the foci appears to be acute and there do not appear to be any new lesions compared to 09/24/2017 MRI. There is increased adenoidal tissue in the posterior nasopharynx.  This appears unchanged compared to the previous MRI.  There is a normal enhancement pattern and there are no acute findings.   REVIEW OF SYSTEMS: Constitutional: No fevers, chills, sweats, or change in appetite.    She has insomnia and headaches and restless leg syndrome she reports a lot of fatigue. Eyes: No visual changes, double vision, eye pain Ear, nose and throat: No hearing  loss, ear pain, nasal congestion, sore throat Cardiovascular: No chest pain, palpitations Respiratory: No shortness of breath at rest or with exertion.   No wheezes GastrointestinaI:  She has constipation.   No nausea, vomiting, diarrhea, abdominal pain, fecal incontinence Genitourinary: No dysuria, urinary retention or frequency.  No nocturia. Musculoskeletal: as above Integumentary: No rash, skin lesions.   Some prurutis Neurological: as above Psychiatric: Notes depression > anxiety Endocrine: No palpitations, diaphoresis, change in appetite, change in weigh or increased thirst Hematologic/Lymphatic: No anemia, purpura, petechiae. Allergic/Immunologic: No itchy/runny eyes, nasal congestion, recent allergic reactions, rashes  ALLERGIES: Allergies  Allergen Reactions  . Bee Venom Swelling  . Hydrocodone-Acetaminophen Itching  . Benzoyl Peroxide Rash  . Progesterone Other (See Comments) and Rash    increase/profuse sweating-flushing-  . Sertraline Hcl Other (See Comments)  . Statins Swelling    muscle and joint pain/tightness  . Trazodone Other (See Comments)    dizziness  . Betadine [Povidone Iodine]     Blisters  . Codeine Itching    Rash  . Darvocet [Propoxyphene N-Acetaminophen] Nausea And Vomiting  . Diazepam     Increases agitation  . Dilaudid [Hydromorphone Hcl] Itching  . Doxycycline     Facial flushing  . Keppra [Levetiracetam]     Shaking, nervousness, palpations and flushing  . Lamictal [Lamotrigine]     Causes tongue and mouth to tingle  . Lipitor [Atorvastatin]     Severe muscle and joint pain / ms relapse  . Lyrica [Pregabalin] Other (See Comments)    Doses over 300mg  decrease memory  . Neurontin [Gabapentin]     Impaired cognition, slower motor skills (intolerant)  . Other     EDTA causes blisters  . Oxycodone Hcl Itching  . Pravastatin Other (See Comments)    Severe joint pain MS relapse  . Sertraline Other (See Comments)    Increased nervousness   . Topamax [Topiramate]     Confusion  . Tramadol Other (See Comments)    "Nervous, jittery, felt weird, nausea"  . Zonisamide     Back pain, headaches, insomnia, chest pressure  . Cephalosporins Rash  . Erythromycin Itching and Rash  . Keflex [Cephalexin] Rash  . Vancomycin Rash    Pt stated, " I can't tolerate Vancomycin I get a red rash."    HOME MEDICATIONS:  Current Outpatient Medications:  .  acetaminophen (TYLENOL) 650 MG CR tablet, Take 1,300 mg by mouth every 8 (eight) hours as needed for pain. Takes 1-2 times per day, Disp: , Rfl:  .  CELEBREX 200 MG capsule, Take 1 capsule (200 mg total) by mouth 2 (two) times daily., Disp: 180 capsule, Rfl: 1 .  cetirizine (ZYRTEC) 10 MG tablet, Take 10 mg by mouth daily., Disp: , Rfl:  .  Cholecalciferol (VITAMIN D3) 125 MCG (5000 UT) CAPS, Take 5,000 Units by mouth daily. , Disp: , Rfl:  .  clonazePAM (KLONOPIN) 0.5 MG tablet, TAKE ONE TABLET BY MOUTH TWICE A DAY AS NEEDED (Patient taking differently: Take 0.5 mg by mouth at bedtime and may repeat dose one time if needed.), Disp: 60 tablet, Rfl: 5 .  cyclobenzaprine (FLEXERIL) 10 MG tablet, TAKE ONE TABLET BY MOUTH THREE TIMES A DAY AS NEEDED, Disp: 40 tablet, Rfl: 1 .  DULoxetine (CYMBALTA) 60 MG capsule, TAKE TWO CAPSULES BY MOUTH DAILY (Patient taking differently: Take 60 mg by mouth 2 (two) times daily.), Disp: 180 capsule, Rfl: 3 .  EPINEPHrine 0.3 mg/0.3 mL IJ SOAJ injection, Inject 0.3 mg into the muscle as needed for anaphylaxis., Disp: , Rfl:  .  esomeprazole (NEXIUM) 20 MG capsule, Take 20 mg by mouth in the morning and at bedtime., Disp: , Rfl:  .  fenofibrate (TRICOR) 145 MG tablet, Take 145 mg by mouth daily., Disp: , Rfl:  .  fluticasone (FLONASE) 50 MCG/ACT nasal spray, Place 2 sprays into both nostrils at bedtime. , Disp: , Rfl:  .  hydrochlorothiazide (HYDRODIURIL) 25 MG tablet, Take 25 mg by mouth daily., Disp: , Rfl:  .  HYDROmorphone (DILAUDID) 2 MG tablet, Take 1 tablet  (2 mg total) by mouth  every 4 (four) hours as needed., Disp: 30 tablet, Rfl: 0 .  Melatonin 10 MG CAPS, Take 10 mg by mouth at bedtime. , Disp: , Rfl:  .  natalizumab (TYSABRI) 300 MG/15ML injection, Inject 300 mg into the vein every 6 (six) weeks. Takes for MS, Disp: , Rfl:  .  Olopatadine HCl 0.6 % SOLN, Place 2 sprays into the nose daily., Disp: , Rfl:  .  ondansetron (ZOFRAN) 4 MG tablet, Take 1 tablet (4 mg total) by mouth every 8 (eight) hours as needed for nausea or vomiting., Disp: 10 tablet, Rfl: 0 .  pregabalin (LYRICA) 150 MG capsule, Take 1 capsule (150 mg total) by mouth 2 (two) times daily., Disp: 180 capsule, Rfl: 3 .  rizatriptan (MAXALT) 10 MG tablet, Take 10 mg by mouth every 2 (two) hours as needed for migraine. May repeat in 2 hours if needed, Disp: , Rfl:  .  senna-docusate (SENOKOT-S) 8.6-50 MG tablet, Take 2 tablets by mouth 2 (two) times daily., Disp: , Rfl:  .  tiZANidine (ZANAFLEX) 4 MG tablet, Take 1 tablet (4 mg total) by mouth 3 (three) times daily., Disp: 90 tablet, Rfl: 11 .  UNABLE TO FIND, Med Name: Saxseencia 3mg , Disp: , Rfl:  .  verapamil (CALAN-SR) 180 MG CR tablet, Take 1 tablet (180 mg total) by mouth at bedtime., Disp: 90 tablet, Rfl: 3 .  zaleplon (SONATA) 10 MG capsule, Take 10 mg by mouth at bedtime., Disp: , Rfl:  No current facility-administered medications for this visit.  Facility-Administered Medications Ordered in Other Visits:  .  gadopentetate dimeglumine (MAGNEVIST) injection 20 mL, 20 mL, Intravenous, Once PRN, Kekai Geter, Nanine Means, MD  PAST MEDICAL HISTORY: Past Medical History:  Diagnosis Date  . Anemia    h/o of   Last infusion of iron  2008  . Anxiety   . Arthritis    osteo   . Complication of anesthesia   . Constipation   . Depression    d/t decreased activity  . GERD (gastroesophageal reflux disease)   . Headache(784.0)    migraines  . High triglycerides   . Hypertension   . MS (multiple sclerosis) ([place])   . Neuromuscular  disorder ([place])    mutliple sclerosis--dx 10/2008  . PONV (postoperative nausea and vomiting)   . Pre-diabetes   . Restless leg syndrome   . Sleep apnea    mild sleep apnea, no CPAP  . Wears glasses   . Wears glasses     PAST SURGICAL HISTORY: Past Surgical History:  Procedure Laterality Date  . ANTERIOR CERVICAL DECOMP/DISCECTOMY FUSION N/A 12/21/2014   Procedure: ANTERIOR CERVICAL DECOMPRESSION/DISCECTOMY FUSION 2 LEVELS C5-7;  Surgeon: Melina Schools, MD;  Location: [facility];  Service: Orthopedics;  Laterality: N/A;  . BLADDER SUSPENSION     occas  blood in urine  . CARPAL TUNNEL RELEASE    . ENDOMETRIAL ABLATION  2010  . FOOT ARTHRODESIS Right 07/28/2013   Procedure: ARTHRODESIS RIGHT MIDFOOT OF THE FIRST -  THIRD TARSOMETATARSAL JOINTS ;  Surgeon: Wylene Simmer, MD;  Location: [facility];  Service: Orthopedics;  Laterality: Right;  . GASTROCNEMIUS RECESSION Right 07/28/2013   Procedure: RIGHT GASTROCNEMIUS RECESSION;  Surgeon: Wylene Simmer, MD;  Location: [facility];  Service: Orthopedics;  Laterality: Right;  . JOINT REPLACEMENT  2012   lt total knee  . KNEE ARTHROSCOPY     bilateral  in 2010, has multiple scopes  . MEDIAL PARTIAL KNEE REPLACEMENT  right  . nerve blocks    . SHOULDER ARTHROSCOPY Right   . SPINAL FUSION  05-05-2013  . stress fracture     right 2-3 toe  . TENDON RELEASE Right   . TOTAL KNEE ARTHROPLASTY  2008   partial rt   . TOTAL SHOULDER ARTHROPLASTY Left 03/24/2019   Procedure: TOTAL SHOULDER ARTHROPLASTY;  Surgeon: Justice Britain, MD;  Location: [facility];  Service: Orthopedics;  Laterality: Left;  179min   . TOTAL SHOULDER ARTHROPLASTY Right 09/13/2020   Procedure: TOTAL SHOULDER ARTHROPLASTY;  Surgeon: Justice Britain, MD;  Location: [facility];  Service: Orthopedics;  Laterality: Right;  123min    FAMILY HISTORY: Family History  Problem Relation Age of Onset  . Heart disease Mother   . Cancer Mother   . Hypertension Mother   .  Depression Mother   . Heart disease Other   . Hypertension Maternal Grandmother   . Diabetes Maternal Grandmother   . Kidney disease Maternal Grandmother   . Rheum arthritis Maternal Grandmother   . Depression Maternal Grandmother   . Hypertension Paternal Grandmother   . Kidney disease Maternal Grandfather   . Depression Maternal Grandfather   . Depression Brother     SOCIAL HISTORY:  Social History   Socioeconomic History  . Marital status: Married    Spouse name: Not on file  . Number of children: 3  . Years of education: Not on file  . Highest education level: Not on file  Occupational History    Comment: RN  Tobacco Use  . Smoking status: Never Smoker  . Smokeless tobacco: Never Used  Vaping Use  . Vaping Use: Never used  Substance and Sexual Activity  . Alcohol use: Yes    Comment: socially--wine & beer  . Drug use: No  . Sexual activity: Not on file  Other Topics Concern  . Not on file  Social History Narrative   Lives at home with   Caffeine use:    Social Determinants of Health   Financial Resource Strain: Not on file  Food Insecurity: Not on file  Transportation Needs: Not on file  Physical Activity: Not on file  Stress: Not on file  Social Connections: Not on file  Intimate Partner Violence: Not on file        DIAGNOSTIC DATA (LABS, IMAGING, TESTING) - I reviewed patient records, labs, notes, testing and imaging myself where available.  Lab Results  Component Value Date   WBC 6.3 09/03/2020   HGB 13.0 09/03/2020   HCT 39.6 09/03/2020   MCV 89.6 09/03/2020   PLT 259 09/03/2020      Component Value Date/Time   NA 142 09/03/2020 1148   NA 143 10/25/2019 1343   K 3.6 09/03/2020 1148   CL 104 09/03/2020 1148   CO2 27 09/03/2020 1148   GLUCOSE 109 (H) 09/03/2020 1148   BUN 17 09/03/2020 1148   BUN 16 10/25/2019 1343   CREATININE 0.76 09/03/2020 1148   CALCIUM 9.5 09/03/2020 1148   PROT 6.5 10/25/2019 1343   ALBUMIN 4.4 10/25/2019  1343   AST 21 10/25/2019 1343   ALT 30 10/25/2019 1343   ALKPHOS 92 10/25/2019 1343   BILITOT 0.4 10/25/2019 1343   GFRNONAA >60 09/03/2020 1148   GFRAA 108 10/25/2019 1343     PHYSICAL EXAM  General: The patient is well-developed and well-nourished and in no acute distress.  The neck is mildly tender with good ROM.  She has reduced range of motion at the  shoulders.     Neurologic Exam  Mental status: The patient is alert and oriented x 3 at the time of the examination.   Motor:  Muscle bulk is normal.   Tone is normal. Strength is  5 / 5 in all 4 extremities except 4+/5 bilateral APB muscles  Sensory: There is a Tinel's sign at both wrists..  She has slight allodynia over the thenar eminence on the left.   Reduced vibration but good touch at toes.  Vibration fine at ankles.    Coordination: Cerebellar testing reveals good finger-nose-finger and heel-to-shin bilaterally.  Gait and station: Station is normal.   Gait and the tandem gait are wide.  Romberg is negative.   Reflexes: Deep tendon reflexes are symmetric and normal bilaterally.       ASSESSMENT AND PLAN  Multiple sclerosis (HCC)  High risk medication use  Dysesthesia  Multiple joint pain  Gait disturbance   1.   Continue Tysabri.  She is JCV antibody positive and will continue Tysabri every 6 weeks.  Check every 6 months 2.  She will continue medications for her pain, myalgias, depression.   I will change Flexeril to tizanidine.   3.  Stay active and exercise as tolerated. 4.    Return in 6 months or sooner if there are new or worsening neurologic symptoms.    Ashawna Hanback A. [name], MD, PhD 9/73/5329, 92:42 AM Certified in Neurology, Clinical Neurophysiology, Sleep Medicine, Pain Medicine and Neuroimaging  [facility] [address] [place], [place] [address] [phone]

## 2020-11-07 ENCOUNTER — Other Ambulatory Visit: Payer: Self-pay | Admitting: Neurology

## 2020-11-23 ENCOUNTER — Other Ambulatory Visit: Payer: Self-pay | Admitting: Neurology

## 2020-12-12 DIAGNOSIS — Z0289 Encounter for other administrative examinations: Secondary | ICD-10-CM

## 2020-12-13 ENCOUNTER — Telehealth: Payer: Self-pay | Admitting: *Deleted

## 2020-12-13 NOTE — Telephone Encounter (Signed)
Gave completed/signed form back to medical records to process for pt. 

## 2020-12-13 NOTE — Telephone Encounter (Signed)
Pt form faxed to [facility] on 12/13/20 [phone]

## 2020-12-17 ENCOUNTER — Telehealth: Payer: Self-pay | Admitting: *Deleted

## 2020-12-17 NOTE — Telephone Encounter (Signed)
I faxed pt attending physician form on 12/17/20

## 2020-12-27 ENCOUNTER — Telehealth: Payer: Self-pay | Admitting: *Deleted

## 2020-12-27 NOTE — Telephone Encounter (Signed)
Gave completed/signed form back to medical records to process for pt. 

## 2021-01-01 ENCOUNTER — Telehealth: Payer: Self-pay | Admitting: *Deleted

## 2021-01-01 NOTE — Telephone Encounter (Signed)
Faxed completed/signed Tysabri pt status report and reauth questionnaire to [facility] at [phone]. Received confirmation.    Received fax from [facility] prescribing program that pt is re-authorized for Tysabri 01/01/2021-07/31/2021. Pt enrollment number: [id number]. Account: [facility]. Site auth number: [id number].

## 2021-02-18 ENCOUNTER — Other Ambulatory Visit: Payer: Self-pay | Admitting: *Deleted

## 2021-02-18 ENCOUNTER — Encounter: Payer: Self-pay | Admitting: Family Medicine

## 2021-02-18 MED ORDER — DULOXETINE HCL 60 MG PO CPEP: 120.0000 mg | ORAL_CAPSULE | Freq: Every day | ORAL | 3 refills | Status: DC

## 2021-03-20 ENCOUNTER — Encounter: Payer: Self-pay | Admitting: Neurology

## 2021-03-20 ENCOUNTER — Telehealth: Payer: Self-pay | Admitting: Neurology

## 2021-03-20 ENCOUNTER — Other Ambulatory Visit: Payer: Self-pay

## 2021-03-20 ENCOUNTER — Other Ambulatory Visit: Payer: Self-pay | Admitting: Neurology

## 2021-03-20 DIAGNOSIS — G35 Multiple sclerosis: Secondary | ICD-10-CM

## 2021-03-20 DIAGNOSIS — Z79899 Other long term (current) drug therapy: Secondary | ICD-10-CM

## 2021-03-20 NOTE — Telephone Encounter (Signed)
Placed JCV lab in [facility] lock box for routine lab pick up. Results pending. 

## 2021-03-21 LAB — CBC WITH DIFFERENTIAL/PLATELET
Basophils Absolute: 0 10*3/uL (ref 0.0–0.2)
Basos: 1 %
EOS (ABSOLUTE): 0.2 10*3/uL (ref 0.0–0.4)
Eos: 4 %
Hematocrit: 38.1 % (ref 34.0–46.6)
Hemoglobin: 12.8 g/dL (ref 11.1–15.9)
Immature Grans (Abs): 0 10*3/uL (ref 0.0–0.1)
Immature Granulocytes: 0 %
Lymphocytes Absolute: 2.1 10*3/uL (ref 0.7–3.1)
Lymphs: 42 %
MCH: 29.4 pg (ref 26.6–33.0)
MCHC: 33.6 g/dL (ref 31.5–35.7)
MCV: 88 fL (ref 79–97)
Monocytes Absolute: 0.3 10*3/uL (ref 0.1–0.9)
Monocytes: 6 %
NRBC: 1 % — ABNORMAL HIGH (ref 0–0)
Neutrophils Absolute: 2.3 10*3/uL (ref 1.4–7.0)
Neutrophils: 47 %
Platelets: 242 10*3/uL (ref 150–450)
RBC: 4.35 x10E6/uL (ref 3.77–5.28)
RDW: 14.5 % (ref 11.7–15.4)
WBC: 5 10*3/uL (ref 3.4–10.8)

## 2021-03-25 ENCOUNTER — Other Ambulatory Visit: Payer: Self-pay | Admitting: Neurology

## 2021-03-25 ENCOUNTER — Other Ambulatory Visit: Payer: Self-pay | Admitting: *Deleted

## 2021-03-25 ENCOUNTER — Encounter: Payer: Self-pay | Admitting: Family Medicine

## 2021-03-25 MED ORDER — VERAPAMIL HCL ER 180 MG PO TBCR: 180.0000 mg | EXTENDED_RELEASE_TABLET | Freq: Every day | ORAL | 1 refills | Status: DC

## 2021-04-30 NOTE — Patient Instructions (Signed)
Below is our plan:  We will continue current treatment plan. Please let me know when you are ready for refills.   Please make sure you are staying well hydrated. I recommend 50-60 ounces daily. Well balanced diet and regular exercise encouraged. Consistent sleep schedule with 6-8 hours recommended.   Please continue follow up with care team as directed.   Follow up with Dr [name] in 6 months   You may receive a survey regarding today's visit. I encourage you to leave honest feed back as I do use this information to improve patient care. Thank you for seeing me today!

## 2021-04-30 NOTE — Progress Notes (Signed)
Chief Complaint  Patient presents with   Follow-up    Rm 1, alone. Here for 6 month MS f/u, on Tysabri. Last infusion date: 03/26/2021. Pt reports getting new eye glass prescription. Change mainly in R eye. Has noticed some eye dryness and using eye refresh drops. Pt is on weight loss journey w PCP.      HISTORY OF PRESENT ILLNESS:  05/01/21 ALL:  Joan Ray is a 60 y.o. female here today for follow up for RRMS. She continues Tysabri q6 weeks. She is scheduled for infusion tomorrow. Last JCV low positive (0.34). Last MRI brain 04/2018 stable, cervical imaging 05/2019 showed degeneration but no demyelination.   She feels that she is doing fairly well. No new or exacerbating symptoms. No significant changes in gait. She did have one fall after bending forward and losing balance.   She feels that biggest problem right now is osteoarthritis pain. She continues Celebrex (Brand) 200mg  daily and tizanidine 4mg  in the morning and 8mg  at bedtime. She also continues pregabalin 150 BID. She was sent to [facility] to see plastic surgeon for consideration for liposuction for adipose cysts. She feels this increases dysesthesia pain. She continues to have tingling in hands and fingers. She is scheduled for right carpal tunnel release in 2 weeks.   She continues verapamil 180mg  daily for migraine prevention. She has occasional head pressure but no migraines.   Mood is good. She continues duloxetine 120mg  daily (for depression and pain) as well as clonazepam 0.5-1mg  at night for anxiety, insomnia and dysesthesias.   Sonata 10mg  (PCP) helps with sleep. She continues to have broken sleep. She blames this on her dog and cat. Cat wakes her up to eat at 4am.   HISTORY (copied from Dr Garth Bigness previous note)  Joan Ray is a 60 y.o. right-handed patient with relapsing remitting MS diagnosed in 2010.      Update 10/31/2020 For MS, she is on Tysabri and tolerates it well.   She had a positive JCV Ab  increasing to 0.9 when last checked but last two was 0.35 (still positive) and 0.28 (negative).   We increased the interval to q6 weeks.  The MS is stable but she feels more fatigue with the longer interval.    The only other DMT she has bene on is Avonex.     She continues to note a lot of tight myalgias in her hands, aching myalgia in her legs and a frostbite dysesthetic sensation in her feet.  She is on Lyrica 150 mg bid and duloxetine 120 mg for dysesthesias.  Lamotrigine was not well tolerated (tongue burning).        Oxcarbazepine 150 mg po qAm and 300 mg po qHS did not help and she felt cognitively off.      She had her shoulder surgery recently.  She is noting more spasms in her right arm.   Cyclobenzaprine is no longer helping.   Clonazepam helped the spasms.       He HgbA1c ws 6.0 and she is trying to lose weight and lost 20 pounds on semaglutide but gained it back.   She is now on Saxenda.   She had CTR on the left and had right shoulder replacement March 2022 and left March 202   MRI cervical spine 05/13/2019 shows C5-C7 ACDF and DJD at other levels, worse at C7T1 with right foraminal narrowing.     .        MS History:  In April 2010, she presented with optic neuritis in the left eye. She also had pain in the left eye. She went to an ophthalmologist who did some tests and referred her or an MRI. The MRI was abnormal, consistent with MS and she was referred to Dr. Maye Hides.   Usually, she was placed on weekly Avonex injections. She did not like the way that she felt on the injections. Additionally, an MRI of the brain showed that she had 2 new lesions. Therefore, she was switched to Tysabri in 2011.     On Tysabri, she has had no definite exacerbations, no change in MRI. Additionally she tolerates the medication very well. She has had some low positive JCV antibody titers. We do not have the actual numbers.    She switched from Dr. Starleen Blue to Dr. William Hamburger in [place] in 2013. She is  interested in changing to an [place].    Last MRI was early 2016 at [facility] in [place].       Spine/limb pain history:    She reports having  L5-S1 fusion in October 2014 and C5-C7 fusion June 2016.   Although some symptoms improved after surgery, she continues to have a lot of difficulties with neck pain, arm pain, back pain and leg pain.    She was placed on Lyrica for her pain and was titrated to 200 mg twice a day but could not tolerate that dose. She has since been reduced to 100 mg by mouth twice a day and tolerates that dose but continues to have pain.      She reports some axial neck and lower back pain but the dysesthetic pain in the hands and feet is more troublesome.    Both hands have the dysesthetic pain but in the legs, the right foot is much worse than the left.   She gets some benefit from Lyrica.   She felt her pain had been better on Cymbalta than on Effexor. However, in 2013 when she was having more neurologic issues the Cymbalta was discontinued.   In the right foot, the pain is most intense on the top of the foot and towards the ankle but not above the ankle.    I personally reviewed the MRI of the cervical spine from 10/07/2014.   It shows degenerative changes at C5-C6 resulting in left foraminal narrowing and changes at C6-C7 resulting right foraminal narrowing. The spinal cord appears normal. Plain films done after surgery showed a C5-C7 fusion and lumbar plain films showed the L5-S1 fusion.   STUDIES: Nerve conduction and EMG study 07/2019 showed bilateral carpal tunnel syndrome, worse on the left and a mild left chronic C7 radiculopathy without any active features.  We discussed referring her to a hand surgeon as she has noted increasing pain over the past couple months.  Additionally, the carpal tunnel syndrome on the left has worsened compared to the 2018 NCV/EMG study   MRI Cervical spine 05/13/2019 showed  That the spinal cord appears normal.   ACDF at  C5-C7.  This has occurred since the 07/02/2010 MRI. .   Mild degenerative changes at C2-C3, C3-C4 and C4-C5, T1-T2 and T2-T3 that do not lead to significant foraminal narrowing or spinal stenosis.  At C7-T1, there is moderate right foraminal narrowing but no definite nerve root compression.   Degenerative changes at C7-T1 and T1-T2 have progressed compared to the 2011 MRI.   MRI Brain 04/13/2018 showed multiple T2/FLAIR hyperintense foci in the  hemispheres in a pattern and configuration consistent with chronic demyelinating plaque associated with multiple sclerosis.  None of the foci appears to be acute and there do not appear to be any new lesions compared to 09/24/2017 MRI. There is increased adenoidal tissue in the posterior nasopharynx.  This appears unchanged compared to the previous MRI.  There is a normal enhancement pattern and there are no acute findings.     REVIEW OF SYSTEMS: Out of a complete 14 system review of symptoms, the patient complains only of the following symptoms, joint pain, dysesthesias, depression, anxiety, headaches, and all other reviewed systems are negative.   ALLERGIES: Allergies  Allergen Reactions   Bee Venom Swelling   Hydrocodone-Acetaminophen Itching   Benzoyl Peroxide Rash   Progesterone Other (See Comments) and Rash    increase/profuse sweating-flushing-   Sertraline Hcl Other (See Comments)   Statins Swelling    muscle and joint pain/tightness   Trazodone Other (See Comments)    dizziness   Betadine [Povidone Iodine]     Blisters   Codeine Itching    Rash   Darvocet [Propoxyphene N-Acetaminophen] Nausea And Vomiting   Diazepam     Increases agitation   Dilaudid [Hydromorphone Hcl] Itching   Doxycycline     Facial flushing   Keppra [Levetiracetam]     Shaking, nervousness, palpations and flushing   Lamictal [Lamotrigine]     Causes tongue and mouth to tingle   Lipitor [Atorvastatin]     Severe muscle and joint pain / ms relapse   Lyrica  [Pregabalin] Other (See Comments)    Doses over 300mg  decrease memory   Neurontin [Gabapentin]     Impaired cognition, slower motor skills (intolerant)   Other     EDTA causes blisters   Oxycodone Hcl Itching   Pravastatin Other (See Comments)    Severe joint pain MS relapse   Sertraline Other (See Comments)    Increased nervousness   Topamax [Topiramate]     Confusion   Tramadol Other (See Comments)    "Nervous, jittery, felt weird, nausea"   Zonisamide     Back pain, headaches, insomnia, chest pressure   Cephalosporins Rash   Erythromycin Itching and Rash   Keflex [Cephalexin] Rash   Vancomycin Rash    Pt stated, " I can't tolerate Vancomycin I get a red rash."     HOME MEDICATIONS: Outpatient Medications Prior to Visit  Medication Sig Dispense Refill   acetaminophen (TYLENOL) 650 MG CR tablet Take 1,300 mg by mouth every 8 (eight) hours as needed for pain. Takes 1-2 times per day     CELEBREX 200 MG capsule TAKE ONE CAPSULE BY MOUTH TWICE A DAY 180 capsule 1   cetirizine (ZYRTEC) 10 MG tablet Take 10 mg by mouth daily.     Cholecalciferol (VITAMIN D3) 125 MCG (5000 UT) CAPS Take 5,000 Units by mouth daily.      clonazePAM (KLONOPIN) 0.5 MG tablet TAKE ONE TABLET BY MOUTH TWICE A DAY AS NEEDED 60 tablet 5   DULoxetine (CYMBALTA) 60 MG capsule Take 2 capsules (120 mg total) by mouth daily. 180 capsule 3   EPINEPHrine 0.3 mg/0.3 mL IJ SOAJ injection Inject 0.3 mg into the muscle as needed for anaphylaxis.     esomeprazole (NEXIUM) 20 MG capsule Take 20 mg by mouth in the morning and at bedtime.     fenofibrate (TRICOR) 145 MG tablet Take 145 mg by mouth daily.     fluticasone (FLONASE) 50 MCG/ACT nasal spray Place  2 sprays into both nostrils at bedtime.      hydrochlorothiazide (HYDRODIURIL) 25 MG tablet Take 25 mg by mouth daily.     Melatonin 10 MG CAPS Take 10 mg by mouth at bedtime.      natalizumab (TYSABRI) 300 MG/15ML injection Inject 300 mg into the vein every 6 (six)  weeks. Takes for MS     Olopatadine HCl 0.6 % SOLN Place 2 sprays into the nose daily.     pregabalin (LYRICA) 150 MG capsule Take 1 capsule (150 mg total) by mouth 2 (two) times daily. 180 capsule 3   rizatriptan (MAXALT) 10 MG tablet Take 10 mg by mouth every 2 (two) hours as needed for migraine. May repeat in 2 hours if needed     senna-docusate (SENOKOT-S) 8.6-50 MG tablet Take 2 tablets by mouth 2 (two) times daily.     tiZANidine (ZANAFLEX) 4 MG tablet Take 1 tablet (4 mg total) by mouth 3 (three) times daily. 90 tablet 11   TRULICITY 1.5 TI/1.4ER SOPN Inject into the skin once a week.     verapamil (CALAN-SR) 180 MG CR tablet Take 1 tablet (180 mg total) by mouth at bedtime. 90 tablet 1   zaleplon (SONATA) 10 MG capsule Take 10 mg by mouth at bedtime.     cyclobenzaprine (FLEXERIL) 10 MG tablet TAKE ONE TABLET BY MOUTH THREE TIMES A DAY AS NEEDED 40 tablet 1   HYDROmorphone (DILAUDID) 2 MG tablet Take 1 tablet (2 mg total) by mouth every 4 (four) hours as needed. 30 tablet 0   ondansetron (ZOFRAN) 4 MG tablet Take 1 tablet (4 mg total) by mouth every 8 (eight) hours as needed for nausea or vomiting. 10 tablet 0   UNABLE TO FIND Med Name: Saxseencia 3mg      Facility-Administered Medications Prior to Visit  Medication Dose Route Frequency Provider Last Rate Last Admin   gadopentetate dimeglumine (MAGNEVIST) injection 20 mL  20 mL Intravenous Once PRN Sater, Nanine Means, MD         PAST MEDICAL HISTORY: Past Medical History:  Diagnosis Date   Anemia    h/o of   Last infusion of iron  2008   Anxiety    Arthritis    osteo    Complication of anesthesia    Constipation    Depression    d/t decreased activity   GERD (gastroesophageal reflux disease)    Headache(784.0)    migraines   High triglycerides    Hypertension    MS (multiple sclerosis) (HCC)    Neuromuscular disorder ([place])    mutliple sclerosis--dx 10/2008   PONV (postoperative nausea and vomiting)    Pre-diabetes     Restless leg syndrome    Sleep apnea    mild sleep apnea, no CPAP   Wears glasses    Wears glasses      PAST SURGICAL HISTORY: Past Surgical History:  Procedure Laterality Date   ANTERIOR CERVICAL DECOMP/DISCECTOMY FUSION N/A 12/21/2014   Procedure: ANTERIOR CERVICAL DECOMPRESSION/DISCECTOMY FUSION 2 LEVELS C5-7;  Surgeon: Melina Schools, MD;  Location: [place] [facility];  Service: Orthopedics;  Laterality: N/A;   BLADDER SUSPENSION     occas  blood in urine   CARPAL TUNNEL RELEASE     ENDOMETRIAL ABLATION  2010   FOOT ARTHRODESIS Right 07/28/2013   Procedure: ARTHRODESIS RIGHT MIDFOOT OF THE FIRST -  Mucarabones ;  Surgeon: Wylene Simmer, MD;  Location: [facility];  Service: Orthopedics;  Laterality: Right;  GASTROCNEMIUS RECESSION Right 07/28/2013   Procedure: RIGHT GASTROCNEMIUS RECESSION;  Surgeon: Wylene Simmer, MD;  Location: [facility];  Service: Orthopedics;  Laterality: Right;   JOINT REPLACEMENT  2012   lt total knee   KNEE ARTHROSCOPY     bilateral  in 2010, has multiple scopes   MEDIAL PARTIAL KNEE REPLACEMENT     right   nerve blocks     SHOULDER ARTHROSCOPY Right    SPINAL FUSION  05-05-2013   stress fracture     right 2-3 toe   TENDON RELEASE Right    TOTAL KNEE ARTHROPLASTY  2008   partial rt    TOTAL SHOULDER ARTHROPLASTY Left 03/24/2019   Procedure: TOTAL SHOULDER ARTHROPLASTY;  Surgeon: Justice Britain, MD;  Location: [facility];  Service: Orthopedics;  Laterality: Left;  167min    TOTAL SHOULDER ARTHROPLASTY Right 09/13/2020   Procedure: TOTAL SHOULDER ARTHROPLASTY;  Surgeon: Justice Britain, MD;  Location: [facility];  Service: Orthopedics;  Laterality: Right;  135min     FAMILY HISTORY: Family History  Problem Relation Age of Onset   Heart disease Mother    Cancer Mother    Hypertension Mother    Depression Mother    Heart disease Other    Hypertension Maternal Grandmother    Diabetes Maternal Grandmother    Kidney disease  Maternal Grandmother    Rheum arthritis Maternal Grandmother    Depression Maternal Grandmother    Hypertension Paternal Grandmother    Kidney disease Maternal Grandfather    Depression Maternal Grandfather    Depression Brother      SOCIAL HISTORY: Social History   Socioeconomic History   Marital status: Married    Spouse name: Not on file   Number of children: 3   Years of education: Not on file   Highest education level: Not on file  Occupational History    Comment: RN  Tobacco Use   Smoking status: Never   Smokeless tobacco: Never  Vaping Use   Vaping Use: Never used  Substance and Sexual Activity   Alcohol use: Yes    Comment: socially--wine & beer   Drug use: No   Sexual activity: Not on file  Other Topics Concern   Not on file  Social History Narrative   Lives at home with   Caffeine use:    Social Determinants of Health   Financial Resource Strain: Not on file  Food Insecurity: Not on file  Transportation Needs: Not on file  Physical Activity: Not on file  Stress: Not on file  Social Connections: Not on file  Intimate Partner Violence: Not on file     PHYSICAL EXAM  Vitals:   05/01/21 1008  BP: 123/72  Pulse: 73  Weight: 270 lb 8 oz (122.7 kg)  Height: 5\' 8"  (1.727 m)   Body mass index is 41.13 kg/m.  Generalized: Well developed, in no acute distress  Cardiology: normal rate and rhythm, no murmur auscultated  Respiratory: clear to auscultation bilaterally    Neurological examination  Mentation: Alert oriented to time, place, history taking. Follows all commands speech and language fluent Cranial nerve II-XII: Pupils were equal round reactive to light. Extraocular movements were full, visual field were full on confrontational test. Facial sensation and strength were normal. Head turning and shoulder shrug  were normal and symmetric. Motor: The motor testing reveals 5 over 5 strength of all 4 extremities. Good symmetric motor tone is noted  throughout.  Sensory: Sensory testing is intact to soft  touch on all 4 extremities. No evidence of extinction is noted.  Coordination: Cerebellar testing reveals good finger-nose-finger and heel-to-shin bilaterally.  Gait and station: Gait is normal.  Reflexes: Deep tendon reflexes are symmetric and normal bilaterally.    DIAGNOSTIC DATA (LABS, IMAGING, TESTING) - I reviewed patient records, labs, notes, testing and imaging myself where available.  Lab Results  Component Value Date   WBC 5.0 03/20/2021   HGB 12.8 03/20/2021   HCT 38.1 03/20/2021   MCV 88 03/20/2021   PLT 242 03/20/2021      Component Value Date/Time   NA 142 09/03/2020 1148   NA 143 10/25/2019 1343   K 3.6 09/03/2020 1148   CL 104 09/03/2020 1148   CO2 27 09/03/2020 1148   GLUCOSE 109 (H) 09/03/2020 1148   BUN 17 09/03/2020 1148   BUN 16 10/25/2019 1343   CREATININE 0.76 09/03/2020 1148   CALCIUM 9.5 09/03/2020 1148   PROT 6.5 10/25/2019 1343   ALBUMIN 4.4 10/25/2019 1343   AST 21 10/25/2019 1343   ALT 30 10/25/2019 1343   ALKPHOS 92 10/25/2019 1343   BILITOT 0.4 10/25/2019 1343   GFRNONAA >60 09/03/2020 1148   GFRAA 108 10/25/2019 1343   No results found for: CHOL, HDL, LDLCALC, LDLDIRECT, TRIG, CHOLHDL Lab Results  Component Value Date   HGBA1C 5.8 (H) 09/03/2020   No results found for: VITAMINB12 No results found for: TSH  No flowsheet data found.   No flowsheet data found.   ASSESSMENT AND PLAN  60 y.o. year old female  has a past medical history of Anemia, Anxiety, Arthritis, Complication of anesthesia, Constipation, Depression, GERD (gastroesophageal reflux disease), Headache(784.0), High triglycerides, Hypertension, MS (multiple sclerosis) ([place]), Neuromuscular disorder ([place]), PONV (postoperative nausea and vomiting), Pre-diabetes, Restless leg syndrome, Sleep apnea, Wears glasses, and Wears glasses. here with    Multiple sclerosis ([place])  High risk medication  use  Dysesthesia  Depression with anxiety  Insomnia, unspecified type   Jenny Reichmann is doing well, today. MS symptoms are stable. She will continue Tysabri every 6 weeks. Last JCV low positive. Will continue to monitor. She will continue medications as prescribed. Update eye exam scheduled. Carpal tunnel surgery planned next month. Healthy lifestyle habits encouraged. She does not need refills at this time and will call us when she is due. PDMP appropriate. She will follow up in 6 months, sooner if needed.   No orders of the defined types were placed in this encounter.    No orders of the defined types were placed in this encounter.     Debbora Presto, MSN, FNP-C 05/01/2021, 10:51 AM  [facility] [address] [place], [place] [address] [phone]

## 2021-05-01 ENCOUNTER — Encounter: Payer: Self-pay | Admitting: Family Medicine

## 2021-05-01 ENCOUNTER — Ambulatory Visit (INDEPENDENT_AMBULATORY_CARE_PROVIDER_SITE_OTHER): Payer: 59 | Admitting: Family Medicine

## 2021-05-01 VITALS — BP 123/72 | HR 73 | Ht 68.0 in | Wt 270.5 lb

## 2021-05-01 DIAGNOSIS — Z79899 Other long term (current) drug therapy: Secondary | ICD-10-CM | POA: Diagnosis not present

## 2021-05-01 DIAGNOSIS — R208 Other disturbances of skin sensation: Secondary | ICD-10-CM

## 2021-05-01 DIAGNOSIS — G4700 Insomnia, unspecified: Secondary | ICD-10-CM

## 2021-05-01 DIAGNOSIS — F418 Other specified anxiety disorders: Secondary | ICD-10-CM

## 2021-05-01 DIAGNOSIS — G35 Multiple sclerosis: Secondary | ICD-10-CM

## 2021-05-11 ENCOUNTER — Other Ambulatory Visit: Payer: Self-pay | Admitting: Neurology

## 2021-05-13 ENCOUNTER — Encounter: Payer: Self-pay | Admitting: Family Medicine

## 2021-05-13 ENCOUNTER — Other Ambulatory Visit: Payer: Self-pay | Admitting: Neurology

## 2021-05-29 ENCOUNTER — Other Ambulatory Visit: Payer: Self-pay | Admitting: Neurology

## 2021-07-11 ENCOUNTER — Telehealth: Payer: Self-pay

## 2021-07-11 NOTE — Telephone Encounter (Signed)
Shalina VAN HOY (Key: [id number])  Form [facility] [employer] Non-Medicare Celebrex Form Prior Authorization for Non-Medicare members for Celebrex [phone] [phone] Determination should be made within 5 business days.

## 2021-07-12 ENCOUNTER — Ambulatory Visit: Payer: 59 | Admitting: Plastic Surgery

## 2021-07-12 ENCOUNTER — Encounter: Payer: Self-pay | Admitting: Plastic Surgery

## 2021-07-12 ENCOUNTER — Other Ambulatory Visit: Payer: Self-pay

## 2021-07-12 VITALS — BP 125/75 | HR 72 | Ht 69.0 in | Wt 266.0 lb

## 2021-07-12 DIAGNOSIS — D179 Benign lipomatous neoplasm, unspecified: Secondary | ICD-10-CM

## 2021-07-12 DIAGNOSIS — D171 Benign lipomatous neoplasm of skin and subcutaneous tissue of trunk: Secondary | ICD-10-CM

## 2021-07-14 NOTE — Progress Notes (Signed)
Referring Provider Crowder, Christiane Ha, MD [place],  [place] [address]   CC:  Pain and cutaneous masses.  Joan Ray is an 61 y.o. female.  HPI: Patient is a 61 year old who notes that she has pain in discomfort associated with subcutaneous masses that she notes are in multiple locations throughout her body but most notable area is her left leg over her iliotibial band.  She has had a lipoma removed from her back in the past.  She states that she has a diagnosis of adiposis [place].  Allergies  Allergen Reactions   Bee Venom Swelling   Hydrocodone-Acetaminophen Itching   Benzoyl Peroxide Rash   Progesterone Other (See Comments) and Rash    increase/profuse sweating-flushing-   Sertraline Hcl Other (See Comments)   Statins Swelling    muscle and joint pain/tightness   Trazodone Other (See Comments)    dizziness   Betadine [Povidone Iodine]     Blisters   Codeine Itching    Rash   Darvocet [Propoxyphene N-Acetaminophen] Nausea And Vomiting   Diazepam     Increases agitation   Dilaudid [Hydromorphone Hcl] Itching   Doxycycline     Facial flushing   Keppra [Levetiracetam]     Shaking, nervousness, palpations and flushing   Lamictal [Lamotrigine]     Causes tongue and mouth to tingle   Lipitor [Atorvastatin]     Severe muscle and joint pain / ms relapse   Lyrica [Pregabalin] Other (See Comments)    Doses over 300mg  decrease memory   Neurontin [Gabapentin]     Impaired cognition, slower motor skills (intolerant)   Other     EDTA causes blisters   Oxycodone Hcl Itching   Pravastatin Other (See Comments)    Severe joint pain MS relapse   Sertraline Other (See Comments)    Increased nervousness   Topamax [Topiramate]     Confusion   Tramadol Other (See Comments)    "Nervous, jittery, felt weird, nausea"   Zonisamide     Back pain, headaches, insomnia, chest pressure   Cephalosporins Rash   Erythromycin Itching and Rash   Keflex  [Cephalexin] Rash   Vancomycin Rash    Pt stated, " I can't tolerate Vancomycin I get a red rash."    Outpatient Encounter Medications as of 07/12/2021  Medication Sig Note   acetaminophen (TYLENOL) 650 MG CR tablet Take 1,300 mg by mouth every 8 (eight) hours as needed for pain. Takes 1-2 times per day    CELEBREX 200 MG capsule TAKE ONE CAPSULE BY MOUTH TWICE A DAY    cetirizine (ZYRTEC) 10 MG tablet Take 10 mg by mouth daily.    Cholecalciferol (VITAMIN D3) 125 MCG (5000 UT) CAPS Take 5,000 Units by mouth daily.     clonazePAM (KLONOPIN) 0.5 MG tablet TAKE ONE TABLET BY MOUTH TWICE A DAY AS NEEDED    DULoxetine (CYMBALTA) 60 MG capsule Take 2 capsules (120 mg total) by mouth daily.    EPINEPHrine 0.3 mg/0.3 mL IJ SOAJ injection Inject 0.3 mg into the muscle as needed for anaphylaxis.    esomeprazole (NEXIUM) 20 MG capsule Take 20 mg by mouth in the morning and at bedtime.    fenofibrate (TRICOR) 145 MG tablet Take 145 mg by mouth daily.    fluticasone (FLONASE) 50 MCG/ACT nasal spray Place 2 sprays into both nostrils at bedtime.     hydrochlorothiazide (HYDRODIURIL) 25 MG tablet Take 25 mg by mouth daily.  lisdexamfetamine (VYVANSE) 10 MG capsule     Melatonin 10 MG CAPS Take 10 mg by mouth at bedtime.     natalizumab (TYSABRI) 300 MG/15ML injection Inject 300 mg into the vein every 6 (six) weeks. Takes for MS    Olopatadine HCl 0.6 % SOLN Place 2 sprays into the nose daily.    pregabalin (LYRICA) 150 MG capsule TAKE ONE CAPSULE BY MOUTH TWICE A DAY    rizatriptan (MAXALT) 10 MG tablet Take 10 mg by mouth every 2 (two) hours as needed for migraine. May repeat in 2 hours if needed    senna-docusate (SENOKOT-S) 8.6-50 MG tablet Take 2 tablets by mouth 2 (two) times daily. 08/23/2020: Pericolace    tiZANidine (ZANAFLEX) 4 MG tablet Take 1 tablet (4 mg total) by mouth 3 (three) times daily.    TRULICITY 1.5 QQ/7.6PP SOPN Inject into the skin once a week.    verapamil (CALAN-SR) 180 MG CR  tablet Take 1 tablet (180 mg total) by mouth at bedtime.    zaleplon (SONATA) 10 MG capsule Take 10 mg by mouth at bedtime.    Facility-Administered Encounter Medications as of 07/12/2021  Medication   gadopentetate dimeglumine (MAGNEVIST) injection 20 mL     Past Medical History:  Diagnosis Date   Anemia    h/o of   Last infusion of iron  2008   Anxiety    Arthritis    osteo    Complication of anesthesia    Constipation    Depression    d/t decreased activity   GERD (gastroesophageal reflux disease)    Headache(784.0)    migraines   High triglycerides    Hypertension    MS (multiple sclerosis) (HCC)    Neuromuscular disorder ([place])    mutliple sclerosis--dx 10/2008   PONV (postoperative nausea and vomiting)    Pre-diabetes    Restless leg syndrome    Sleep apnea    mild sleep apnea, no CPAP   Wears glasses    Wears glasses     Past Surgical History:  Procedure Laterality Date   ANTERIOR CERVICAL DECOMP/DISCECTOMY FUSION N/A 12/21/2014   Procedure: ANTERIOR CERVICAL DECOMPRESSION/DISCECTOMY FUSION 2 LEVELS C5-7;  Surgeon: Melina Schools, MD;  Location: [facility];  Service: Orthopedics;  Laterality: N/A;   BLADDER SUSPENSION     occas  blood in urine   CARPAL TUNNEL RELEASE     ENDOMETRIAL ABLATION  2010   FOOT ARTHRODESIS Right 07/28/2013   Procedure: ARTHRODESIS RIGHT MIDFOOT OF THE FIRST -  [place] ;  Surgeon: Wylene Simmer, MD;  Location: [facility];  Service: Orthopedics;  Laterality: Right;   GASTROCNEMIUS RECESSION Right 07/28/2013   Procedure: RIGHT GASTROCNEMIUS RECESSION;  Surgeon: Wylene Simmer, MD;  Location: [facility];  Service: Orthopedics;  Laterality: Right;   JOINT REPLACEMENT  2012   lt total knee   KNEE ARTHROSCOPY     bilateral  in 2010, has multiple scopes   MEDIAL PARTIAL KNEE REPLACEMENT     right   nerve blocks     SHOULDER ARTHROSCOPY Right    SPINAL FUSION  05-05-2013   stress fracture     right 2-3  toe   TENDON RELEASE Right    TOTAL KNEE ARTHROPLASTY  2008   partial rt    TOTAL SHOULDER ARTHROPLASTY Left 03/24/2019   Procedure: TOTAL SHOULDER ARTHROPLASTY;  Surgeon: Justice Britain, MD;  Location: [facility];  Service: Orthopedics;  Laterality: Left;  166min  TOTAL SHOULDER ARTHROPLASTY Right 09/13/2020   Procedure: TOTAL SHOULDER ARTHROPLASTY;  Surgeon: Justice Britain, MD;  Location: [facility];  Service: Orthopedics;  Laterality: Right;  175min    Family History  Problem Relation Age of Onset   Heart disease Mother    Cancer Mother    Hypertension Mother    Depression Mother    Heart disease Other    Hypertension Maternal Grandmother    Diabetes Maternal Grandmother    Kidney disease Maternal Grandmother    Rheum arthritis Maternal Grandmother    Depression Maternal Grandmother    Hypertension Paternal Grandmother    Kidney disease Maternal Grandfather    Depression Maternal Grandfather    Depression Brother     Social History   Social History Narrative   Lives at home with   Caffeine use:      Review of Systems General: Denies fevers, chills, weight loss CV: Denies chest pain, shortness of breath, palpitations   Physical Exam Vitals with BMI 07/12/2021 05/01/2021 10/31/2020  Height 5\' 9"  5\' 8"  5\' 8"   Weight 266 lbs 270 lbs 8 oz 270 lbs  BMI 39.26 03.47 42.59  Systolic 563 875 643  Diastolic 75 72 86  Pulse 72 73 79    General:  No acute distress,  Alert and oriented, Non-Toxic, Normal speech and affect Extremity: Subtle subcutaneous changes palpable on physical exam and multiple areas but no large or distinct masses noted  Assessment/Plan Patient notes that her most concerning area is her left leg.  I think is reasonable get a ultrasound to determine whether any distinct masses are noted on ultrasound.  Lennice Sites 07/14/2021, 8:03 PM

## 2021-07-17 NOTE — Telephone Encounter (Signed)
Received fax from [facility] that PA approved 07/17/21-07/17/22. PA# [employer] [id number]

## 2021-07-24 ENCOUNTER — Other Ambulatory Visit: Payer: Self-pay | Admitting: Family Medicine

## 2021-08-02 NOTE — Addendum Note (Signed)
Addended by: Lennice Sites on: 08/02/2021 03:30 PM   Modules accepted: Orders

## 2021-08-02 NOTE — Addendum Note (Signed)
Addended by: Lennice Sites on: 08/02/2021 03:17 PM   Modules accepted: Orders

## 2021-08-12 ENCOUNTER — Encounter: Payer: Self-pay | Admitting: Neurology

## 2021-08-15 ENCOUNTER — Other Ambulatory Visit (HOSPITAL_BASED_OUTPATIENT_CLINIC_OR_DEPARTMENT_OTHER): Payer: 59

## 2021-08-20 ENCOUNTER — Ambulatory Visit (HOSPITAL_BASED_OUTPATIENT_CLINIC_OR_DEPARTMENT_OTHER)
Admission: RE | Admit: 2021-08-20 | Discharge: 2021-08-20 | Disposition: A | Discharge status: Home / Self Care | Payer: 59 | Source: Ambulatory Visit | Attending: Plastic Surgery | Admitting: Plastic Surgery

## 2021-08-20 ENCOUNTER — Other Ambulatory Visit: Payer: Self-pay

## 2021-08-20 DIAGNOSIS — D179 Benign lipomatous neoplasm, unspecified: Secondary | ICD-10-CM | POA: Insufficient documentation

## 2021-08-22 ENCOUNTER — Other Ambulatory Visit: Payer: Self-pay | Admitting: Neurology

## 2021-09-11 ENCOUNTER — Encounter: Payer: Self-pay | Admitting: Neurology

## 2021-09-11 ENCOUNTER — Other Ambulatory Visit (INDEPENDENT_AMBULATORY_CARE_PROVIDER_SITE_OTHER): Payer: Self-pay

## 2021-09-11 ENCOUNTER — Telehealth: Payer: Self-pay | Admitting: Neurology

## 2021-09-11 ENCOUNTER — Other Ambulatory Visit: Payer: Self-pay | Admitting: Neurology

## 2021-09-11 DIAGNOSIS — G35 Multiple sclerosis: Secondary | ICD-10-CM

## 2021-09-11 DIAGNOSIS — Z0289 Encounter for other administrative examinations: Secondary | ICD-10-CM

## 2021-09-11 NOTE — Telephone Encounter (Signed)
Placed JCV lab in [facility] lock box for routine lab pick up. Results pending. 

## 2021-09-12 LAB — CBC WITH DIFFERENTIAL/PLATELET
Basophils Absolute: 0 10*3/uL (ref 0.0–0.2)
Basos: 1 %
EOS (ABSOLUTE): 0.2 10*3/uL (ref 0.0–0.4)
Eos: 4 %
Hematocrit: 37.5 % (ref 34.0–46.6)
Hemoglobin: 12.5 g/dL (ref 11.1–15.9)
Immature Grans (Abs): 0 10*3/uL (ref 0.0–0.1)
Immature Granulocytes: 0 %
Lymphocytes Absolute: 1.8 10*3/uL (ref 0.7–3.1)
Lymphs: 40 %
MCH: 29.1 pg (ref 26.6–33.0)
MCHC: 33.3 g/dL (ref 31.5–35.7)
MCV: 87 fL (ref 79–97)
Monocytes Absolute: 0.3 10*3/uL (ref 0.1–0.9)
Monocytes: 7 %
NRBC: 1 % — ABNORMAL HIGH (ref 0–0)
Neutrophils Absolute: 2.2 10*3/uL (ref 1.4–7.0)
Neutrophils: 48 %
Platelets: 246 10*3/uL (ref 150–450)
RBC: 4.3 x10E6/uL (ref 3.77–5.28)
RDW: 14.3 % (ref 11.7–15.4)
WBC: 4.6 10*3/uL (ref 3.4–10.8)

## 2021-09-17 ENCOUNTER — Other Ambulatory Visit: Payer: Self-pay | Admitting: *Deleted

## 2021-09-17 ENCOUNTER — Encounter: Payer: Self-pay | Admitting: Family Medicine

## 2021-09-17 MED ORDER — VERAPAMIL HCL ER 180 MG PO TBCR: 180.0000 mg | EXTENDED_RELEASE_TABLET | Freq: Every day | ORAL | 1 refills | Status: DC

## 2021-09-23 NOTE — Telephone Encounter (Signed)
JCV ab drawn on 09/11/21 indetermindate, index: 0.27. Inhibition assay: positive.  ?

## 2021-10-30 ENCOUNTER — Encounter: Payer: Self-pay | Admitting: Neurology

## 2021-10-30 ENCOUNTER — Ambulatory Visit: Payer: 59 | Admitting: Neurology

## 2021-10-30 VITALS — BP 147/84 | HR 65 | Ht 69.0 in | Wt 271.0 lb

## 2021-10-30 DIAGNOSIS — G35 Multiple sclerosis: Secondary | ICD-10-CM

## 2021-10-30 DIAGNOSIS — M2550 Pain in unspecified joint: Secondary | ICD-10-CM

## 2021-10-30 DIAGNOSIS — F418 Other specified anxiety disorders: Secondary | ICD-10-CM

## 2021-10-30 DIAGNOSIS — G4700 Insomnia, unspecified: Secondary | ICD-10-CM

## 2021-10-30 DIAGNOSIS — R269 Unspecified abnormalities of gait and mobility: Secondary | ICD-10-CM

## 2021-10-30 DIAGNOSIS — Z79899 Other long term (current) drug therapy: Secondary | ICD-10-CM | POA: Diagnosis not present

## 2021-10-30 DIAGNOSIS — R208 Other disturbances of skin sensation: Secondary | ICD-10-CM

## 2021-10-30 MED ORDER — CLONAZEPAM 0.5 MG PO TABS: ORAL_TABLET | ORAL | 5 refills | Status: DC

## 2021-10-30 NOTE — Progress Notes (Signed)
? ? ? ?[facility] ? ?PATIENT: Joan Ray ?DOB: 1961-03-14 ? ?REFERRING DOCTOR OR PCP:  Melina Schools [phone]) [name] (PCP) ?SOURCE: Patient, MRI images on PACS and CD and notes from PCP and ortho ? ?_________________________________ ? ? ?HISTORICAL ? ?CHIEF COMPLAINT:  ?Chief Complaint  ?Patient presents with  ? Follow-up  ?  RM 2. Last seen 05/01/21.  ? ? ?HISTORY OF PRESENT ILLNESS:  ?Aleyna Cueva is a 61 y.o. right-handed patient with relapsing remitting MS diagnosed in 2010.    ? ?Update 10/30/2021 ?For MS, she is on Tysabri and tolerates it well.   She had a positive JCV Ab increasing to 0.9 once but only 0.27 -0.35 (positive) last few ties checked.   We increased the interval to q6 weeks.  The MS is stable but she feels more fatigue with the longer interval.    The only other DMT she has bene on is Avonex.   ? ?She is reporting more pain in her limbs.  Feet are burning.  This intensifies to more fiery with activity and as the day goes on.   Initially she felt the alpha lipoic acid was helping but now is not sure.    Pain is worse with movements.   She is not sleeping due to pain she believes.   Her Fitbit reports low deep sleep.   Clonazepam 1 mg helps her sleep.  Clonazepam also helps her spasms.    She is also on Lyrica 150 mg bid and duloxetine . Tizanidine was not helpful so she went on baclofen but it is not helping.   Flexeril had helped but not as much recently.   .    Lamotrigine had not helped her neuropathic pain and was not tolerated.(Tongue burning) and oxcarbazepine had not helped.         ? ?HgbA1c ws 6.0 and she is trying to lose weight   Wegovy was not covererd.  Trulicity had not helped. She is seeing a weight loss Center [facility] on [address]) ? ?She reports fatigue and poor sleep.    She has more depression, se feels due to more pain  ? ?She had CTR on the left and had right shoulder replacement March 2022 and left March 202 ? ?MRI cervical  spine 05/13/2019 shows C5-C7 ACDF and DJD at other levels, worse at C7T1 with right foraminal narrowing.    ? .      ? ?MS History:   In April 2010, she presented with optic neuritis in the left eye. She also had pain in the left eye. She went to an ophthalmologist who did some tests and referred her or an MRI. The MRI was abnormal, consistent with MS and she was referred to Dr. Maye Hides.   Usually, she was placed on weekly Avonex injections. She did not like the way that she felt on the injections. Additionally, an MRI of the brain showed that she had 2 new lesions. Therefore, she was switched to Tysabri in 2011.     On Tysabri, she has had no definite exacerbations, no change in MRI. Additionally she tolerates the medication very well. She has had some low positive JCV antibody titers. We do not have the actual numbers.    She switched from Dr. Starleen Blue to Dr. William Hamburger in [place] in 2013. She is interested in changing to an [place].    Last MRI was early 2016 at [facility] in [place].   ? ? ?  Spine/limb pain history:    She reports having  L5-S1 fusion in October 2014 and C5-C7 fusion June 2016.   Although some symptoms improved after surgery, she continues to have a lot of difficulties with neck pain, arm pain, back pain and leg pain.    She was placed on Lyrica for her pain and was titrated to 200 mg twice a day but could not tolerate that dose. She has since been reduced to 100 mg by mouth twice a day and tolerates that dose but continues to have pain.      She reports some axial neck and lower back pain but the dysesthetic pain in the hands and feet is more troublesome.    Both hands have the dysesthetic pain but in the legs, the right foot is much worse than the left.   She gets some benefit from Lyrica.   She felt her pain had been better on Cymbalta than on Effexor. However, in 2013 when she was having more neurologic issues the Cymbalta was discontinued.   In the right foot, the  pain is most intense on the top of the foot and towards the ankle but not above the ankle.    I personally reviewed the MRI of the cervical spine from 10/07/2014.   It shows degenerative changes at C5-C6 resulting in left foraminal narrowing and changes at C6-C7 resulting right foraminal narrowing. The spinal cord appears normal. Plain films done after surgery showed a C5-C7 fusion and lumbar plain films showed the L5-S1 fusion. ? ?STUDIES: ?Nerve conduction and EMG study 07/2019 showed bilateral carpal tunnel syndrome, worse on the left and a mild left chronic C7 radiculopathy without any active features.  We discussed referring her to a hand surgeon as she has noted increasing pain over the past couple months.  Additionally, the carpal tunnel syndrome on the left has worsened compared to the 2018 NCV/EMG study ? ?MRI Cervical spine 05/13/2019 showed  That the spinal cord appears normal.   ACDF at C5-C7.  This has occurred since the 07/02/2010 MRI. .   Mild degenerative changes at C2-C3, C3-C4 and C4-C5, T1-T2 and T2-T3 that do not lead to significant foraminal narrowing or spinal stenosis.  At C7-T1, there is moderate right foraminal narrowing but no definite nerve root compression.   Degenerative changes at C7-T1 and T1-T2 have progressed compared to the 2011 MRI. ? ?MRI Brain 04/13/2018 showed multiple T2/FLAIR hyperintense foci in the hemispheres in a pattern and configuration consistent with chronic demyelinating plaque associated with multiple sclerosis.  None of the foci appears to be acute and there do not appear to be any new lesions compared to 09/24/2017 MRI. ?There is increased adenoidal tissue in the posterior nasopharynx.  This appears unchanged compared to the previous MRI.  There is a normal enhancement pattern and there are no acute findings. ? ? ?REVIEW OF SYSTEMS: ?Constitutional: No fevers, chills, sweats, or change in appetite.    She has insomnia and headaches and restless leg syndrome she reports  a lot of fatigue. ?Eyes: No visual changes, double vision, eye pain ?Ear, nose and throat: No hearing loss, ear pain, nasal congestion, sore throat ?Cardiovascular: No chest pain, palpitations ?Respiratory:  No shortness of breath at rest or with exertion.   No wheezes ?GastrointestinaI:  She has constipation.   No nausea, vomiting, diarrhea, abdominal pain, fecal incontinence ?Genitourinary:  No dysuria, urinary retention or frequency.  No nocturia. ?Musculoskeletal:  as above ?Integumentary: No rash, skin lesions.   Some  prurutis ?Neurological: as above ?Psychiatric: Notes depression > anxiety ?Endocrine: No palpitations, diaphoresis, change in appetite, change in weigh or increased thirst ?Hematologic/Lymphatic:  No anemia, purpura, petechiae. ?Allergic/Immunologic: No itchy/runny eyes, nasal congestion, recent allergic reactions, rashes ? ?ALLERGIES: ?Allergies  ?Allergen Reactions  ? Bee Venom Swelling  ? Hydrocodone-Acetaminophen Itching  ? Benzoyl Peroxide Rash  ? Progesterone Other (See Comments) and Rash  ?  increase/profuse sweating-flushing-  ? Sertraline Hcl Other (See Comments)  ? Statins Swelling  ?  muscle and joint pain/tightness  ? Trazodone Other (See Comments)  ?  dizziness  ? Betadine [Povidone Iodine]   ?  Blisters  ? Codeine Itching  ?  Rash  ? Darvocet [Propoxyphene N-Acetaminophen] Nausea And Vomiting  ? Diazepam   ?  Increases agitation  ? Dilaudid [Hydromorphone Hcl] Itching  ? Doxycycline   ?  Facial flushing  ? Keppra [Levetiracetam]   ?  Shaking, nervousness, palpations and flushing  ? Lamictal [Lamotrigine]   ?  Causes tongue and mouth to tingle  ? Lipitor [Atorvastatin]   ?  Severe muscle and joint pain / ms relapse  ? Lyrica [Pregabalin] Other (See Comments)  ?  Doses over '300mg'$  decrease memory  ? Neurontin [Gabapentin]   ?  Impaired cognition, slower motor skills (intolerant)  ? Other   ?  EDTA causes blisters  ? Oxycodone Hcl Itching  ? Pravastatin Other (See Comments)  ?  Severe  joint pain MS relapse  ? Sertraline Other (See Comments)  ?  Increased nervousness  ? Topamax [Topiramate]   ?  Confusion  ? Tramadol Other (See Comments)  ?  "Nervous, jittery, felt weird, nausea"  ?

## 2021-10-31 ENCOUNTER — Telehealth: Payer: Self-pay | Admitting: Neurology

## 2021-10-31 NOTE — Telephone Encounter (Signed)
[employer] order sent to [facility], [facility] they will reach out to the patient to schedule.  ?

## 2021-11-07 ENCOUNTER — Other Ambulatory Visit: Payer: Self-pay | Admitting: Neurology

## 2021-11-07 ENCOUNTER — Encounter: Payer: Self-pay | Admitting: Neurology

## 2021-11-07 ENCOUNTER — Other Ambulatory Visit: Payer: Self-pay | Admitting: *Deleted

## 2021-11-07 MED ORDER — CLONAZEPAM 0.5 MG PO TABS: ORAL_TABLET | ORAL | 5 refills | Status: DC

## 2021-11-07 NOTE — Telephone Encounter (Signed)
Last OV was on 10/30/21.  ?Next OV is scheduled for 05/07/22.  ?Last RX was written on 08/08/21 for 180 tabs.  ? ?[place] Drug Database has been reviewed.  ?

## 2021-11-10 ENCOUNTER — Other Ambulatory Visit: Payer: 59

## 2021-11-25 ENCOUNTER — Ambulatory Visit
Admission: RE | Admit: 2021-11-25 | Discharge: 2021-11-25 | Disposition: A | Discharge status: Home / Self Care | Payer: 59 | Source: Ambulatory Visit | Attending: Neurology | Admitting: Neurology

## 2021-11-25 DIAGNOSIS — G35 Multiple sclerosis: Secondary | ICD-10-CM

## 2021-11-25 DIAGNOSIS — R269 Unspecified abnormalities of gait and mobility: Secondary | ICD-10-CM

## 2022-01-20 ENCOUNTER — Other Ambulatory Visit: Payer: Self-pay

## 2022-01-20 MED ORDER — VERAPAMIL HCL ER 180 MG PO TBCR: 180.0000 mg | EXTENDED_RELEASE_TABLET | Freq: Every day | ORAL | 1 refills | Status: DC

## 2022-01-25 ENCOUNTER — Encounter: Payer: Self-pay | Admitting: Neurology

## 2022-02-08 ENCOUNTER — Other Ambulatory Visit: Payer: Self-pay | Admitting: Neurology

## 2022-02-09 ENCOUNTER — Encounter: Payer: Self-pay | Admitting: Family Medicine

## 2022-02-10 ENCOUNTER — Other Ambulatory Visit: Payer: Self-pay | Admitting: Neurology

## 2022-02-10 MED ORDER — PREGABALIN 150 MG PO CAPS: 150.0000 mg | ORAL_CAPSULE | Freq: Two times a day (BID) | ORAL | 1 refills | Status: DC

## 2022-02-10 NOTE — Telephone Encounter (Signed)
Last OV was on 10/30/21.  Next OV is scheduled for 05/07/22.  Last RX was written on 11/08/21 for 180 tabs.   [place] Drug Database has been reviewed.

## 2022-02-22 ENCOUNTER — Encounter: Payer: Self-pay | Admitting: Neurology

## 2022-02-22 ENCOUNTER — Other Ambulatory Visit: Payer: Self-pay | Admitting: Neurology

## 2022-02-24 ENCOUNTER — Other Ambulatory Visit: Payer: Self-pay | Admitting: Neurology

## 2022-02-24 MED ORDER — DULOXETINE HCL 60 MG PO CPEP: 120.0000 mg | ORAL_CAPSULE | Freq: Every day | ORAL | 0 refills | Status: DC

## 2022-02-27 IMAGING — US US EXTREM LOW*L* LIMITED
1 series · 14 of 24 positions shown · non-contrast
Comparison: None.

CLINICAL DATA: soft tissue mass

EXAM:
ULTRASOUND left LOWER EXTREMITY LIMITED
TECHNIQUE: Ultrasound examination of the lower extremity soft tissues was
performed in the area of clinical concern.

[Series 1: us left lower extrem ltd soft tissue non vascular · 24 acquisitions, 14 frames shown]
[im 1/24]
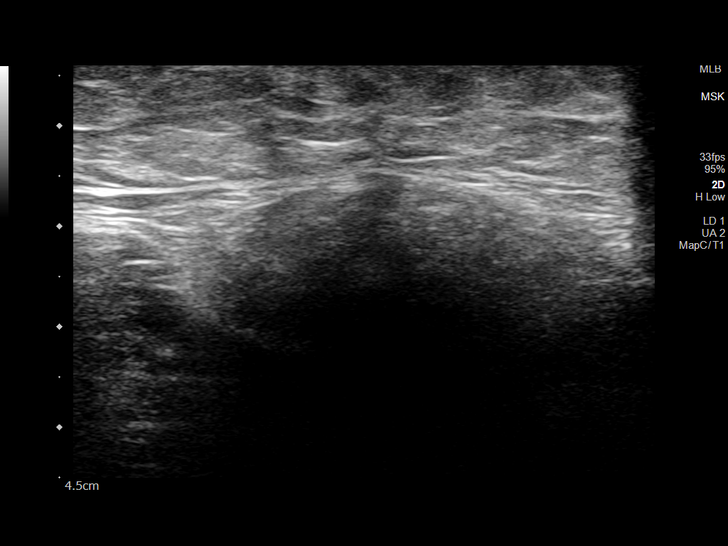
[im 3/24]
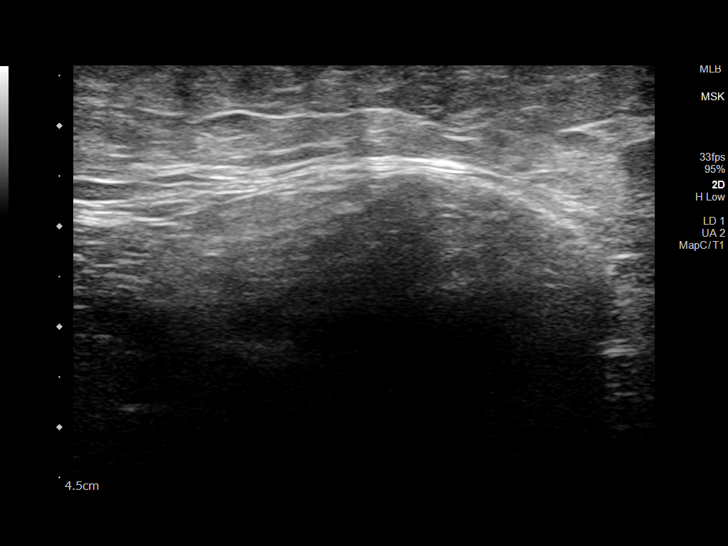
[im 5/24]
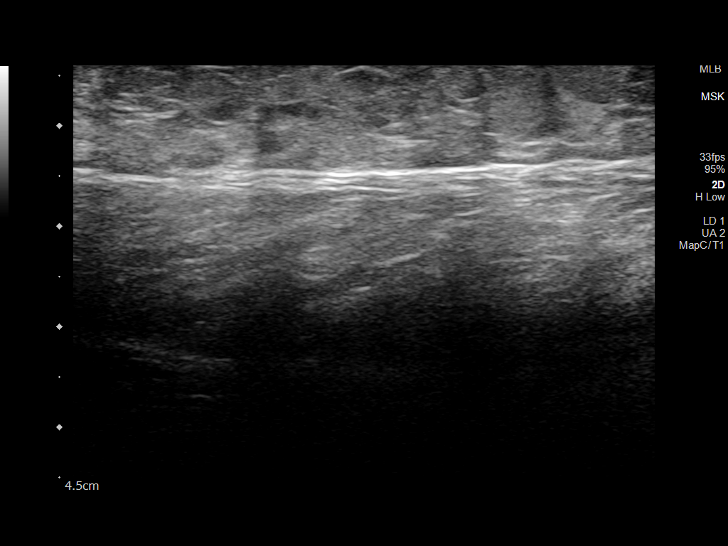
[im 7/24]
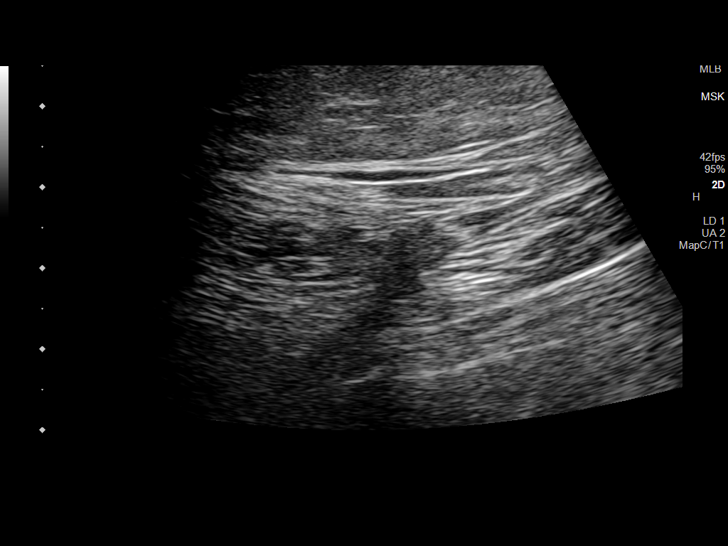
[im 8/24]
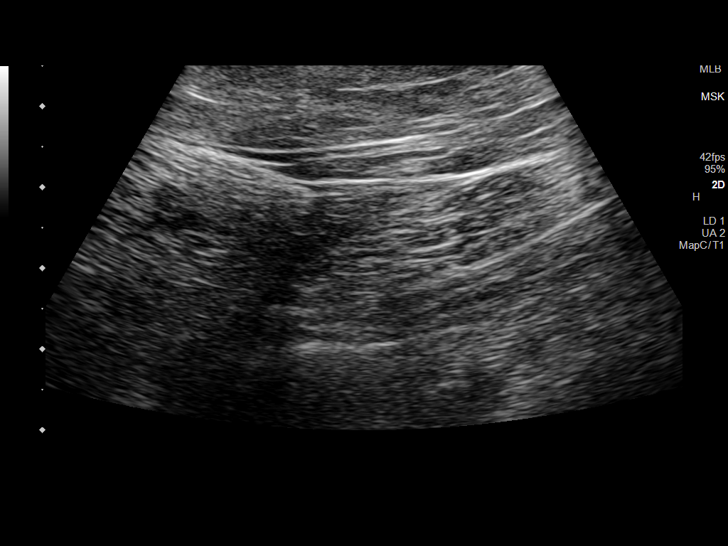
[im 10/24]
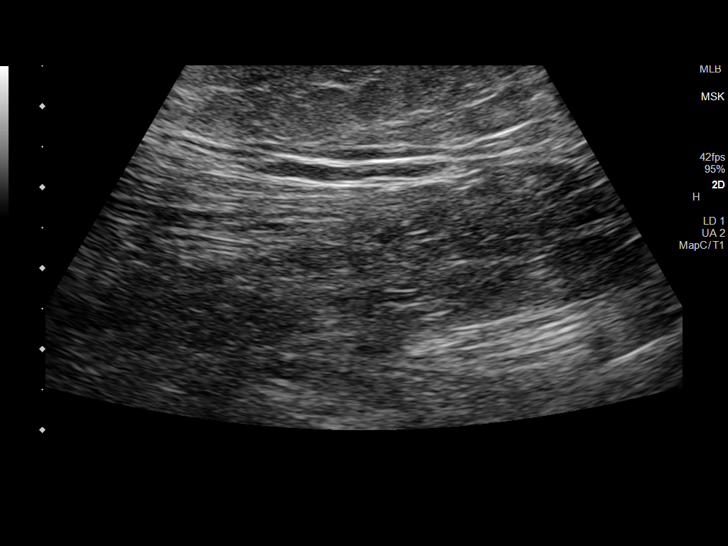
[im 12/24]
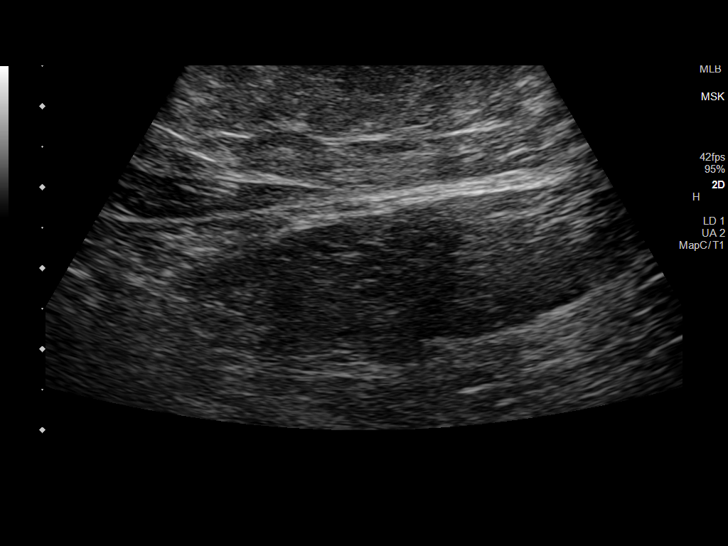
[im 13/24]
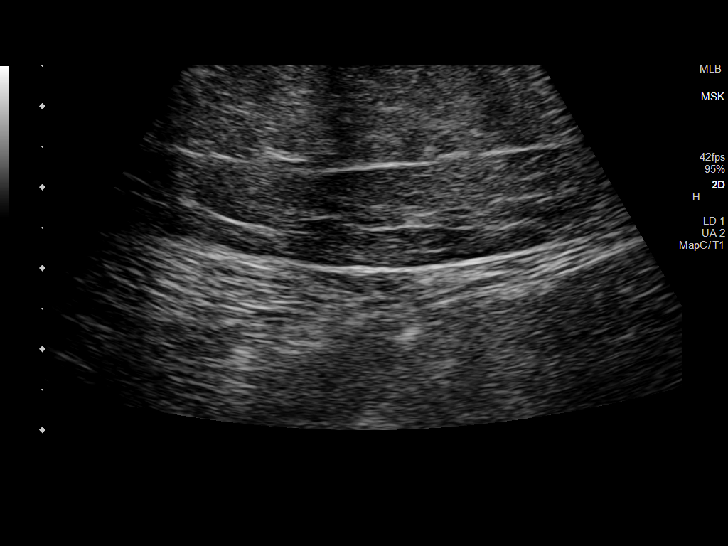
[im 15/24]
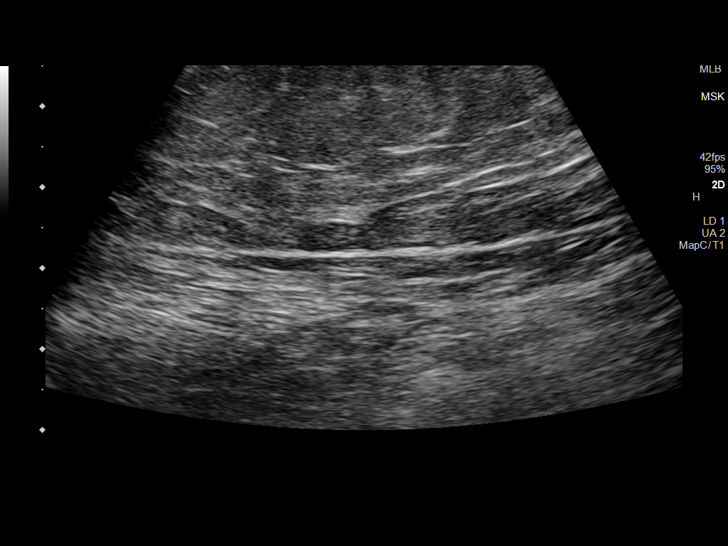
[im 17/24]
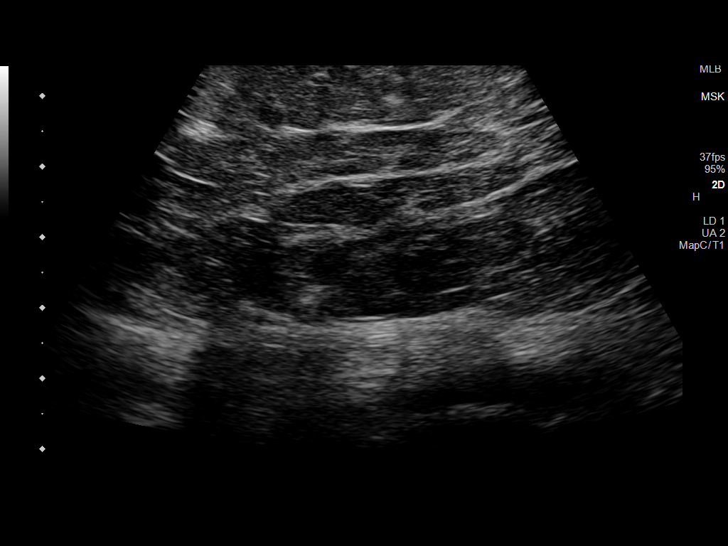
[im 19/24]
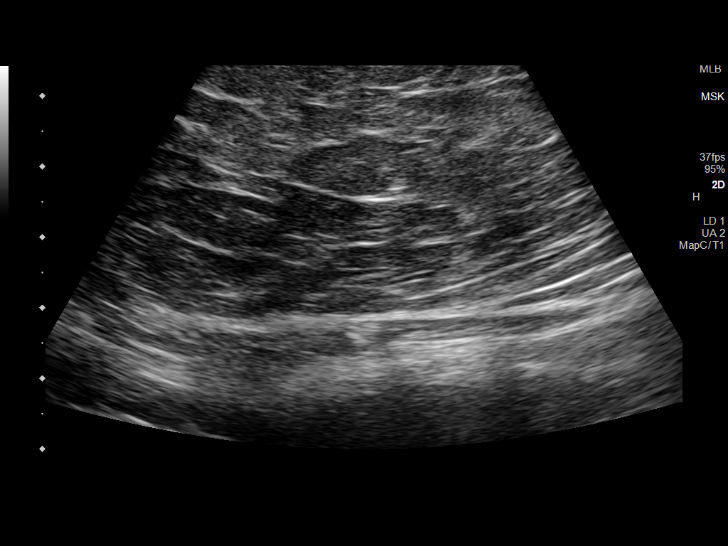
[im 20/24]
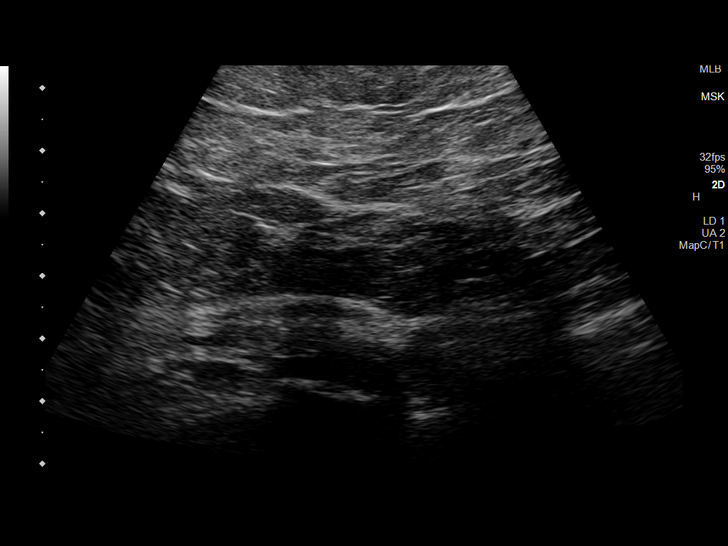
[im 22/24]
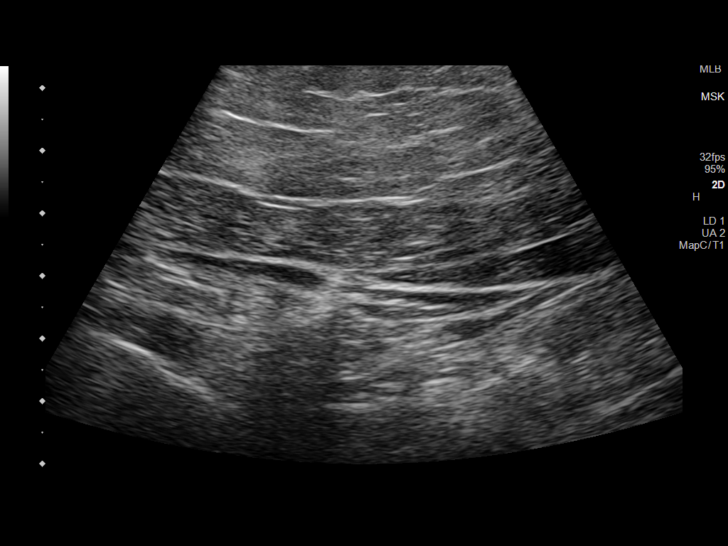
[im 24/24]
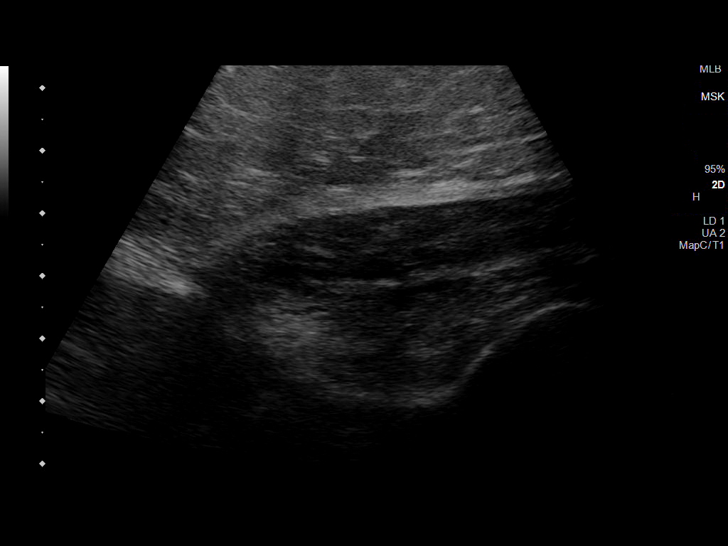

[14 of 24 positions shown; findings below may reference images not displayed]

FINDINGS: No mass or cyst noted within the left lateral proximal to distal
thigh subcutaneus soft tissues.
IMPRESSION: No mass or cyst noted within the left lateral proximal to distal
thigh subcutaneus soft tissues.

## 2022-03-02 ENCOUNTER — Encounter: Payer: Self-pay | Admitting: Neurology

## 2022-03-05 ENCOUNTER — Other Ambulatory Visit: Payer: Self-pay | Admitting: *Deleted

## 2022-03-05 ENCOUNTER — Other Ambulatory Visit (INDEPENDENT_AMBULATORY_CARE_PROVIDER_SITE_OTHER): Payer: Self-pay

## 2022-03-05 DIAGNOSIS — G35 Multiple sclerosis: Secondary | ICD-10-CM

## 2022-03-05 DIAGNOSIS — Z79899 Other long term (current) drug therapy: Secondary | ICD-10-CM

## 2022-03-05 DIAGNOSIS — Z0289 Encounter for other administrative examinations: Secondary | ICD-10-CM

## 2022-03-05 NOTE — Progress Notes (Signed)
Placed JCV lab in [facility] lock box for routine lab pick up. Results pending. 

## 2022-03-06 LAB — CBC WITH DIFFERENTIAL/PLATELET
Basophils Absolute: 0 10*3/uL (ref 0.0–0.2)
Basos: 1 %
EOS (ABSOLUTE): 0.2 10*3/uL (ref 0.0–0.4)
Eos: 4 %
Hematocrit: 40.5 % (ref 34.0–46.6)
Hemoglobin: 13.5 g/dL (ref 11.1–15.9)
Immature Grans (Abs): 0 10*3/uL (ref 0.0–0.1)
Immature Granulocytes: 0 %
Lymphocytes Absolute: 2 10*3/uL (ref 0.7–3.1)
Lymphs: 39 %
MCH: 29.7 pg (ref 26.6–33.0)
MCHC: 33.3 g/dL (ref 31.5–35.7)
MCV: 89 fL (ref 79–97)
Monocytes Absolute: 0.3 10*3/uL (ref 0.1–0.9)
Monocytes: 5 %
NRBC: 1 % — ABNORMAL HIGH (ref 0–0)
Neutrophils Absolute: 2.6 10*3/uL (ref 1.4–7.0)
Neutrophils: 51 %
Platelets: 229 10*3/uL (ref 150–450)
RBC: 4.54 x10E6/uL (ref 3.77–5.28)
RDW: 14.4 % (ref 11.7–15.4)
WBC: 5.1 10*3/uL (ref 3.4–10.8)

## 2022-03-14 ENCOUNTER — Telehealth: Payer: Self-pay | Admitting: Neurology

## 2022-03-14 NOTE — Telephone Encounter (Signed)
[facility]) pt had change of insurance to [employer]. Plan advise that pt is now out of network at your infusion site for natalizumab (TYSABRI) 300 MG/15ML injection. Trying to confirm with your network insurance site. If you are out of network want to know if you would submit PA for network exception. Would like a call from the nurse.

## 2022-03-18 ENCOUNTER — Telehealth: Payer: Self-pay

## 2022-03-18 NOTE — Telephone Encounter (Signed)
JCV ab drawn on 03/05/22 indetermindate, index: 0.29. Inhibition assay: negative.

## 2022-03-24 ENCOUNTER — Other Ambulatory Visit: Payer: Self-pay

## 2022-03-24 ENCOUNTER — Telehealth: Payer: Self-pay | Admitting: Neurology

## 2022-03-24 MED ORDER — CLONAZEPAM 0.5 MG PO TABS: ORAL_TABLET | ORAL | 5 refills | Status: DC

## 2022-03-24 NOTE — Telephone Encounter (Signed)
Pt called stating that her clonazePAM (KLONOPIN) 0.5 MG tablet was to be changed to 120 pills due to having to take it qid Pt states pharmacy informed her that they have not received the changed Rx Please advise.

## 2022-03-24 NOTE — Telephone Encounter (Signed)
Returned pt call and called pharmacy to verify was refill reorder for pt. Pt Rx has been requested for refill.

## 2022-04-04 ENCOUNTER — Telehealth: Payer: Self-pay | Admitting: Neurology

## 2022-04-04 NOTE — Telephone Encounter (Signed)
Printed and gave to intrafusion to f/u with pt.

## 2022-04-04 NOTE — Telephone Encounter (Signed)
Pt called. Stated she need a prior authorization sent to medicare, requesting to use the IV infusion center on site. Pt is requesting a call back from the nurse.

## 2022-04-24 ENCOUNTER — Encounter: Payer: Self-pay | Admitting: Neurology

## 2022-04-30 NOTE — Patient Instructions (Addendum)
Below is our plan:  We will continue to monitor. Continue Lyrica, duloxetine, clonazepam, verapamil as prescribed. Consider assistive device like cane or walking stick for stability. Continue follow up with care team as directed. Consider second opinion with [facility] for concerns of subcu cysts.    Please make sure you are staying well hydrated. I recommend 50-60 ounces daily. Well balanced diet and regular exercise encouraged. Consistent sleep schedule with 6-8 hours recommended.   Please continue follow up with care team as directed.   Follow up with Dr [name] in 6 months   You may receive a survey regarding today's visit. I encourage you to leave honest feed back as I do use this information to improve patient care. Thank you for seeing me today!

## 2022-04-30 NOTE — Progress Notes (Signed)
Chief Complaint  Patient presents with   Follow-up    RM 16. Last seen 10/30/21.     HISTORY OF PRESENT ILLNESS:  05/07/22 ALL:  Joan Ray is a 61 y.o. female here today for follow up for RRMS. She continues Tysabri q6 weeks. She is scheduled for infusion tomorrow. Last JCV indeterminate (0.29), inhibition assay negative. Last MRI brain 04/2018 stable, cervical imaging 05/2019 showed degeneration but no demyelination.   She feels that she is doing fairly well. No new or exacerbating symptoms. No significant changes in gait. She has fallen a couple times. She has difficulty with balance with leaning forward but feels this is improving. No significant injuries. She is not using an assistive device. She has been working on [employer]. This has helped with fatigue and balance. She has lost about 45lbs.   She continues generic Celebrax '200mg'$  BID managed with PCP. She is no longer taking tizanidine. She continues to have difficulty with right shoulder pain and stiffness. She is followed by Dr Onnie Graham, ortho. She also continues pregabalin 150 BID. Last filled 109/2023 for 90 days. She continues to have tingling in hands and fingers. She has burning pain over top of both feet at base of toes. She also has neck pain, followed by Dr Rolena Infante, pain management. They have told her there was no other treatment options they could offer.   She continues verapamil '180mg'$  daily for migraine prevention. She has occasional head pressure but no migraines.   Mood is good. She continues duloxetine '120mg'$  daily (for depression and pain) as well as clonazepam that was increased to 0.'5mg'$ QAM, 0.'5mg'$  QPM and '1mg'$  QHS at night for anxiety, insomnia and dysesthesias. Sometimes she skips evening dose. She is tolerating well. Last filled 04/14/2022 for 30 days. Baclofen, tizanidine and Soma discontinued at last visit. Sleep is interrupted due to having two kittens. They keep her up playing at night. She is taking  melatonin '10mg'$  QHS.    HISTORY (copied from Dr Garth Bigness previous note)  Joan Ray is a 61 y.o. right-handed patient with relapsing remitting MS diagnosed in 2010.      Update 10/30/2021 For MS, she is on Tysabri and tolerates it well.   She had a positive JCV Ab increasing to 0.9 once but only 0.27 -0.35 (positive) last few ties checked.   We increased the interval to q6 weeks.  The MS is stable but she feels more fatigue with the longer interval.    The only other DMT she has bene on is Avonex.     She is reporting more pain in her limbs.  Feet are burning.  This intensifies to more fiery with activity and as the day goes on.   Initially she felt the alpha lipoic acid was helping but now is not sure.    Pain is worse with movements.   She is not sleeping due to pain she believes.   Her Fitbit reports low deep sleep.   Clonazepam 1 mg helps her sleep.  Clonazepam also helps her spasms.    She is also on Lyrica 150 mg bid and duloxetine . Tizanidine was not helpful so she went on baclofen but it is not helping.   Flexeril had helped but not as much recently.   .    Lamotrigine had not helped her neuropathic pain and was not tolerated.(Tongue burning) and oxcarbazepine had not helped.           HgbA1c ws 6.0 and she  is trying to lose weight   Wegovy was not covererd.  Trulicity had not helped. She is seeing a weight loss Center [facility] on [place])   She reports fatigue and poor sleep.    She has more depression, se feels due to more pain    She had CTR on the left and had right shoulder replacement March 2022 and left March 202   MRI cervical spine 05/13/2019 shows C5-C7 ACDF and DJD at other levels, worse at C7T1 with right foraminal narrowing.     .      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 She also had pain in the left eye. She went to an ophthalmologist who did some tests and referred her or an MRI. The MRI was abnormal, consistent with  MS and she was referred to Dr. Maye Hides.   Usually, she was placed on weekly Avonex injections. She did not like the way that she felt on the injections. Additionally, an MRI of the brain showed that she had 2 new lesions. Therefore, she was switched to Tysabri in 2011.     On Tysabri, she has had no definite exacerbations, no change in MRI. Additionally she tolerates the medication very well. She has had some low positive JCV antibody titers. We do not have the actual numbers.    She switched from Dr. Starleen Blue to Dr. William Hamburger in [place] in 2013. She is interested in changing to an [place].    Last MRI was early 2016 at [facility] in [place].     Spine/limb pain history:    She reports having  L5-S1 fusion in October 2014 and C5-C7 fusion June 2016.   Although some symptoms improved after surgery, she continues to have a lot of difficulties with neck pain, arm pain, back pain and leg pain.    She was placed on Lyrica for her pain and was titrated to 200 mg twice a day but could not tolerate that dose. She has since been reduced to 100 mg by mouth twice a day and tolerates that dose but continues to have pain.      She reports some axial neck and lower back pain but the dysesthetic pain in the hands and feet is more troublesome.    Both hands have the dysesthetic pain but in the legs, the right foot is much worse than the left.   She gets some benefit from Lyrica.   She felt her pain had been better on Cymbalta than on Effexor. However, in 2013 when she was having more neurologic issues the Cymbalta was discontinued.   In the right foot, the pain is most intense on the top of the foot and towards the ankle but not above the ankle.    I personally reviewed the MRI of the cervical spine from 10/07/2014.   It shows degenerative changes at C5-C6 resulting in left foraminal narrowing and changes at C6-C7 resulting right foraminal narrowing. The spinal cord appears normal. Plain films done  after surgery showed a C5-C7 fusion and lumbar plain films showed the L5-S1 fusion.   STUDIES: Nerve conduction and EMG study 07/2019 showed bilateral carpal tunnel syndrome, worse on the left and a mild left chronic C7 radiculopathy without any active features.  We discussed referring her to a hand surgeon as she has noted increasing pain over the past couple months.  Additionally, the carpal tunnel syndrome on the left  has worsened compared to the 2018 NCV/EMG study   MRI Cervical spine 05/13/2019 showed  That the spinal cord appears normal.   ACDF at C5-C7.  This has occurred since the 07/02/2010 MRI. .   Mild degenerative changes at C2-C3, C3-C4 and C4-C5, T1-T2 and T2-T3 that do not lead to significant foraminal narrowing or spinal stenosis.  At C7-T1, there is moderate right foraminal narrowing but no definite nerve root compression.   Degenerative changes at C7-T1 and T1-T2 have progressed compared to the 2011 MRI.   MRI Brain 04/13/2018 showed multiple T2/FLAIR hyperintense foci in the hemispheres in a pattern and configuration consistent with chronic demyelinating plaque associated with multiple sclerosis.  None of the foci appears to be acute and there do not appear to be any new lesions compared to 09/24/2017 MRI. There is increased adenoidal tissue in the posterior nasopharynx.  This appears unchanged compared to the previous MRI.  There is a normal enhancement pattern and there are no acute findings.   REVIEW OF SYSTEMS: Out of a complete 14 system review of symptoms, the patient complains only of the following symptoms, joint pain, dysesthesias, depression, anxiety, headaches, and all other reviewed systems are negative.   ALLERGIES: Allergies  Allergen Reactions   Bee Venom Swelling   Hydrocodone-Acetaminophen Itching   Benzoyl Peroxide Rash   Progesterone Other (See Comments) and Rash    increase/profuse sweating-flushing-   Sertraline Hcl Other (See Comments)   Statins Swelling     muscle and joint pain/tightness   Trazodone Other (See Comments)    dizziness   Betadine [Povidone Iodine]     Blisters   Codeine Itching    Rash   Darvocet [Propoxyphene N-Acetaminophen] Nausea And Vomiting   Diazepam     Increases agitation   Dilaudid [Hydromorphone Hcl] Itching   Doxycycline     Facial flushing   Keppra [Levetiracetam]     Shaking, nervousness, palpations and flushing   Lamictal [Lamotrigine]     Causes tongue and mouth to tingle   Lipitor [Atorvastatin]     Severe muscle and joint pain / ms relapse   Lyrica [Pregabalin] Other (See Comments)    Doses over '300mg'$  decrease memory   Neurontin [Gabapentin]     Impaired cognition, slower motor skills (intolerant)   Other     EDTA causes blisters   Oxycodone Hcl Itching   Pravastatin Other (See Comments)    Severe joint pain MS relapse   Sertraline Other (See Comments)    Increased nervousness   Topamax [Topiramate]     Confusion   Tramadol Other (See Comments)    "Nervous, jittery, felt weird, nausea"   Zonisamide     Back pain, headaches, insomnia, chest pressure   Cephalosporins Rash   Erythromycin Itching and Rash   Keflex [Cephalexin] Rash   Vancomycin Rash    Pt stated, " I can't tolerate Vancomycin I get a red rash."     HOME MEDICATIONS: Outpatient Medications Prior to Visit  Medication Sig Dispense Refill   acetaminophen (TYLENOL) 650 MG CR tablet Take 1,300 mg by mouth every 8 (eight) hours as needed for pain. Takes 1-2 times per day     Alpha-Lipoic Acid (LIPOIC ACID PO) Take by mouth.     CELEBREX 200 MG capsule TAKE ONE CAPSULE BY MOUTH TWICE A DAY 60 capsule 5   cetirizine (ZYRTEC) 10 MG tablet Take 10 mg by mouth daily.     Cholecalciferol (VITAMIN D3) 125 MCG (5000 UT) CAPS Take  5,000 Units by mouth daily.      clonazePAM (KLONOPIN) 0.5 MG tablet One po qAM, one po qPM and two po qHS 120 tablet 5   DULoxetine (CYMBALTA) 60 MG capsule Take 2 capsules (120 mg total) by mouth daily. 180  capsule 0   EPINEPHrine 0.3 mg/0.3 mL IJ SOAJ injection Inject 0.3 mg into the muscle as needed for anaphylaxis.     esomeprazole (NEXIUM) 20 MG capsule Take 20 mg by mouth in the morning and at bedtime.     fluticasone (FLONASE) 50 MCG/ACT nasal spray Place 2 sprays into both nostrils at bedtime.      hydrochlorothiazide (HYDRODIURIL) 25 MG tablet Take 25 mg by mouth daily.     Melatonin 10 MG CAPS Take 10 mg by mouth at bedtime.      natalizumab (TYSABRI) 300 MG/15ML injection Inject 300 mg into the vein every 6 (six) weeks. Takes for MS     Olopatadine HCl 0.6 % SOLN Place 2 sprays into the nose daily.     pregabalin (LYRICA) 150 MG capsule Take 1 capsule (150 mg total) by mouth 2 (two) times daily. 180 capsule 1   rizatriptan (MAXALT) 10 MG tablet Take 10 mg by mouth every 2 (two) hours as needed for migraine. May repeat in 2 hours if needed     senna-docusate (SENOKOT-S) 8.6-50 MG tablet Take 2 tablets by mouth 2 (two) times daily.     verapamil (CALAN-SR) 180 MG CR tablet Take 1 tablet (180 mg total) by mouth at bedtime. 90 tablet 1   fenofibrate (TRICOR) 145 MG tablet Take 145 mg by mouth daily.     Facility-Administered Medications Prior to Visit  Medication Dose Route Frequency Provider Last Rate Last Admin   gadopentetate dimeglumine (MAGNEVIST) injection 20 mL  20 mL Intravenous Once PRN Sater, Nanine Means, MD         PAST MEDICAL HISTORY: Past Medical History:  Diagnosis Date   Anemia    h/o of   Last infusion of iron  2008   Anxiety    Arthritis    osteo    Complication of anesthesia    Constipation    Depression    d/t decreased activity   GERD (gastroesophageal reflux disease)    Headache(784.0)    migraines   High triglycerides    Hypertension    MS (multiple sclerosis) (HCC)    Neuromuscular disorder ([place])    mutliple sclerosis--dx 10/2008   PONV (postoperative nausea and vomiting)    Pre-diabetes    Restless leg syndrome    Sleep apnea    mild sleep apnea,  no CPAP   Wears glasses    Wears glasses      PAST SURGICAL HISTORY: Past Surgical History:  Procedure Laterality Date   ANTERIOR CERVICAL DECOMP/DISCECTOMY FUSION N/A 12/21/2014   Procedure: ANTERIOR CERVICAL DECOMPRESSION/DISCECTOMY FUSION 2 LEVELS C5-7;  Surgeon: Melina Schools, MD;  Location: [facility];  Service: Orthopedics;  Laterality: N/A;   BLADDER SUSPENSION     occas  blood in urine   CARPAL TUNNEL RELEASE     ENDOMETRIAL ABLATION  2010   FOOT ARTHRODESIS Right 07/28/2013   Procedure: ARTHRODESIS RIGHT MIDFOOT OF THE FIRST -  [place] ;  Surgeon: Wylene Simmer, MD;  Location: [place];  Service: Orthopedics;  Laterality: Right;   GASTROCNEMIUS RECESSION Right 07/28/2013   Procedure: RIGHT GASTROCNEMIUS RECESSION;  Surgeon: Wylene Simmer, MD;  Location: [place];  Service: Orthopedics;  Laterality: Right;   JOINT REPLACEMENT  2012   lt total knee   KNEE ARTHROSCOPY     bilateral  in 2010, has multiple scopes   MEDIAL PARTIAL KNEE REPLACEMENT     right   nerve blocks     SHOULDER ARTHROSCOPY Right    SPINAL FUSION  05-05-2013   stress fracture     right 2-3 toe   TENDON RELEASE Right    TOTAL KNEE ARTHROPLASTY  2008   partial rt    TOTAL SHOULDER ARTHROPLASTY Left 03/24/2019   Procedure: TOTAL SHOULDER ARTHROPLASTY;  Surgeon: Justice Britain, MD;  Location: [facility];  Service: Orthopedics;  Laterality: Left;  163mn    TOTAL SHOULDER ARTHROPLASTY Right 09/13/2020   Procedure: TOTAL SHOULDER ARTHROPLASTY;  Surgeon: SJustice Britain MD;  Location: [facility];  Service: Orthopedics;  Laterality: Right;  1221m     FAMILY HISTORY: Family History  Problem Relation Age of Onset   Heart disease Mother    Cancer Mother    Hypertension Mother    Depression Mother    Heart disease Other    Hypertension Maternal Grandmother    Diabetes Maternal Grandmother    Kidney disease Maternal Grandmother    Rheum arthritis Maternal Grandmother     Depression Maternal Grandmother    Hypertension Paternal Grandmother    Kidney disease Maternal Grandfather    Depression Maternal Grandfather    Depression Brother      SOCIAL HISTORY: Social History   Socioeconomic History   Marital status: Married    Spouse name: Not on file   Number of children: 3   Years of education: Not on file   Highest education level: Not on file  Occupational History    Comment: RN  Tobacco Use   Smoking status: Never   Smokeless tobacco: Never  Vaping Use   Vaping Use: Never used  Substance and Sexual Activity   Alcohol use: Yes    Comment: socially--wine & beer   Drug use: No   Sexual activity: Not on file  Other Topics Concern   Not on file  Social History Narrative   Lives at home with   Caffeine use:    Social Determinants of Health   Financial Resource Strain: Not on file  Food Insecurity: Not on file  Transportation Needs: Not on file  Physical Activity: Not on file  Stress: Not on file  Social Connections: Not on file  Intimate Partner Violence: Not on file     PHYSICAL EXAM  Vitals:   05/07/22 0952  BP: 139/69  Pulse: 60  Weight: 236 lb 12.8 oz (107.4 kg)  Height: '5\' 9"'$  (1.753 m)    Body mass index is 34.97 kg/m.  Generalized: Well developed, in no acute distress  Cardiology: normal rate and rhythm, no murmur auscultated  Respiratory: clear to auscultation bilaterally    Neurological examination  Mentation: Alert oriented to time, place, history taking. Follows all commands speech and language fluent Cranial nerve II-XII: Pupils were equal round reactive to light. Extraocular movements were full, visual field were full on confrontational test. Facial sensation and strength were normal. Head turning and shoulder shrug  were normal and symmetric. Motor: The motor testing reveals 5 over 5 strength of all 4 extremities. Good symmetric motor tone is noted throughout.  Sensory: Sensory testing is intact to soft touch  on all 4 extremities. No evidence of extinction is noted.  Coordination: Cerebellar testing reveals good finger-nose-finger and heel-to-shin bilaterally.  Gait and  station: Gait is normal.  Reflexes: Deep tendon reflexes are symmetric and normal bilaterally.    DIAGNOSTIC DATA (LABS, IMAGING, TESTING) - I reviewed patient records, labs, notes, testing and imaging myself where available.  Lab Results  Component Value Date   WBC 5.1 03/05/2022   HGB 13.5 03/05/2022   HCT 40.5 03/05/2022   MCV 89 03/05/2022   PLT 229 03/05/2022      Component Value Date/Time   NA 142 09/03/2020 1148   NA 143 10/25/2019 1343   K 3.6 09/03/2020 1148   CL 104 09/03/2020 1148   CO2 27 09/03/2020 1148   GLUCOSE 109 (H) 09/03/2020 1148   BUN 17 09/03/2020 1148   BUN 16 10/25/2019 1343   CREATININE 0.76 09/03/2020 1148   CALCIUM 9.5 09/03/2020 1148   PROT 6.5 10/25/2019 1343   ALBUMIN 4.4 10/25/2019 1343   AST 21 10/25/2019 1343   ALT 30 10/25/2019 1343   ALKPHOS 92 10/25/2019 1343   BILITOT 0.4 10/25/2019 1343   GFRNONAA >60 09/03/2020 1148   GFRAA 108 10/25/2019 1343   No results found for: "CHOL", "HDL", "LDLCALC", "LDLDIRECT", "TRIG", "CHOLHDL" Lab Results  Component Value Date   HGBA1C 5.8 (H) 09/03/2020   No results found for: "VITAMINB12" No results found for: "TSH"      No data to display               No data to display           ASSESSMENT AND PLAN  61 y.o. year old female  has a past medical history of Anemia, Anxiety, Arthritis, Complication of anesthesia, Constipation, Depression, GERD (gastroesophageal reflux disease), Headache(784.0), High triglycerides, Hypertension, MS (multiple sclerosis) ([place]), Neuromuscular disorder ([place]), PONV (postoperative nausea and vomiting), Pre-diabetes, Restless leg syndrome, Sleep apnea, Wears glasses, and Wears glasses. here with    Multiple sclerosis ([place])  High risk medication use  Dysesthesia  Gait  disturbance  Depression with anxiety  Multiple joint pain  Insomnia, unspecified type   Jenny Reichmann is doing well, today. MS symptoms are stable. She will continue Tysabri every 6 weeks. Labs stable 02/2022. MRI brain last 2019, cervical in 2020. She will discuss updating imaging with Dr [name] at next visit. Will continue to monitor. She will continue medications as prescribed. PDMP appropriate. Fall precautions advised. Consider assistive device for stability. Healthy lifestyle habits encouraged. She does not need refills at this time and will call us when she is due. She will follow up in 6 months, sooner if needed.   No orders of the defined types were placed in this encounter.     No orders of the defined types were placed in this encounter.      Debbora Presto, MSN, FNP-C 05/07/2022, 10:07 AM  [facility] [address] [place], [place] [address] [phone]

## 2022-05-06 NOTE — Telephone Encounter (Signed)
Update from Kim/intrafusion 05/06/22 at 3:32pm: "I put it in Colleen's hands Orthoptist of intrafusion) on 10/24 and sent her the communication between you and the pt. She said she was working on it but i never heard anything back". Maudie Mercury is following up with Jaclyn Shaggy to get an update.

## 2022-05-07 ENCOUNTER — Encounter: Payer: Self-pay | Admitting: Family Medicine

## 2022-05-07 ENCOUNTER — Ambulatory Visit: Payer: Medicare Other | Admitting: Family Medicine

## 2022-05-07 VITALS — BP 139/69 | HR 60 | Ht 69.0 in | Wt 236.8 lb

## 2022-05-07 DIAGNOSIS — G4700 Insomnia, unspecified: Secondary | ICD-10-CM

## 2022-05-07 DIAGNOSIS — G35 Multiple sclerosis: Secondary | ICD-10-CM

## 2022-05-07 DIAGNOSIS — Z79899 Other long term (current) drug therapy: Secondary | ICD-10-CM | POA: Diagnosis not present

## 2022-05-07 DIAGNOSIS — F418 Other specified anxiety disorders: Secondary | ICD-10-CM

## 2022-05-07 DIAGNOSIS — R208 Other disturbances of skin sensation: Secondary | ICD-10-CM | POA: Diagnosis not present

## 2022-05-07 DIAGNOSIS — R269 Unspecified abnormalities of gait and mobility: Secondary | ICD-10-CM | POA: Diagnosis not present

## 2022-05-07 DIAGNOSIS — M2550 Pain in unspecified joint: Secondary | ICD-10-CM

## 2022-05-16 ENCOUNTER — Other Ambulatory Visit (HOSPITAL_COMMUNITY): Payer: Self-pay | Admitting: Orthopedic Surgery

## 2022-05-16 DIAGNOSIS — Z96612 Presence of left artificial shoulder joint: Secondary | ICD-10-CM

## 2022-05-29 ENCOUNTER — Other Ambulatory Visit: Payer: Self-pay | Admitting: Neurology

## 2022-06-02 ENCOUNTER — Encounter: Payer: Self-pay | Admitting: Family Medicine

## 2022-06-03 ENCOUNTER — Encounter (HOSPITAL_COMMUNITY)
Admission: RE | Admit: 2022-06-03 | Discharge: 2022-06-03 | Disposition: A | Discharge status: Home / Self Care | Payer: Medicare Other | Source: Ambulatory Visit | Attending: Orthopedic Surgery | Admitting: Orthopedic Surgery

## 2022-06-03 DIAGNOSIS — Z96612 Presence of left artificial shoulder joint: Secondary | ICD-10-CM | POA: Insufficient documentation

## 2022-06-03 MED ORDER — TECHNETIUM TC 99M MEDRONATE IV KIT
20.0000 | PACK | Freq: Once | INTRAVENOUS | Status: AC | PRN
  Administered 2022-06-03: 21.5 via INTRAVENOUS

## 2022-06-04 ENCOUNTER — Telehealth: Payer: Self-pay | Admitting: *Deleted

## 2022-06-04 ENCOUNTER — Other Ambulatory Visit: Payer: Self-pay | Admitting: *Deleted

## 2022-06-04 ENCOUNTER — Other Ambulatory Visit: Payer: Medicare Other

## 2022-06-04 DIAGNOSIS — G35 Multiple sclerosis: Secondary | ICD-10-CM

## 2022-06-04 DIAGNOSIS — Z79899 Other long term (current) drug therapy: Secondary | ICD-10-CM

## 2022-06-04 NOTE — Telephone Encounter (Signed)
Placed JCV lab in [facility] lock box for routine lab pick up. Results pending. 

## 2022-06-05 LAB — CBC WITH DIFFERENTIAL/PLATELET
Basophils Absolute: 0 10*3/uL (ref 0.0–0.2)
Basos: 1 %
EOS (ABSOLUTE): 0.2 10*3/uL (ref 0.0–0.4)
Eos: 4 %
Hematocrit: 40.2 % (ref 34.0–46.6)
Hemoglobin: 13.6 g/dL (ref 11.1–15.9)
Immature Grans (Abs): 0 10*3/uL (ref 0.0–0.1)
Immature Granulocytes: 0 %
Lymphocytes Absolute: 2.8 10*3/uL (ref 0.7–3.1)
Lymphs: 50 %
MCH: 29.7 pg (ref 26.6–33.0)
MCHC: 33.8 g/dL (ref 31.5–35.7)
MCV: 88 fL (ref 79–97)
Monocytes Absolute: 0.3 10*3/uL (ref 0.1–0.9)
Monocytes: 5 %
Neutrophils Absolute: 2.3 10*3/uL (ref 1.4–7.0)
Neutrophils: 40 %
Platelets: 249 10*3/uL (ref 150–450)
RBC: 4.58 x10E6/uL (ref 3.77–5.28)
RDW: 14.1 % (ref 11.7–15.4)
WBC: 5.6 10*3/uL (ref 3.4–10.8)

## 2022-06-23 NOTE — Telephone Encounter (Signed)
JCV ab drawn on 06/04/2022 indeterminate, index: 0.30. Inhibition assay: positive. Gave to MD to review.

## 2022-07-02 ENCOUNTER — Other Ambulatory Visit: Payer: Self-pay | Admitting: Neurology

## 2022-07-07 HISTORY — PX: BREAST BIOPSY: SHX20

## 2022-07-07 HISTORY — PX: PICC LINE INSERTION: CATH118290

## 2022-07-12 ENCOUNTER — Other Ambulatory Visit: Payer: Self-pay | Admitting: Neurology

## 2022-07-15 ENCOUNTER — Encounter: Payer: Self-pay | Admitting: Family Medicine

## 2022-07-31 NOTE — Patient Instructions (Addendum)
SURGICAL WAITING ROOM VISITATION Patients having surgery or a procedure may have no more than 2 support people in the waiting area - these visitors may rotate in the visitor waiting room.   Due to an increase in RSV and influenza rates and associated hospitalizations, children ages 32 and under may not visit patients in [place]. If the patient needs to stay at the hospital during part of their recovery, the visitor guidelines for inpatient rooms apply.  PRE-OP VISITATION  Pre-op nurse will coordinate an appropriate time for 1 support person to accompany the patient in pre-op.  This support person may not rotate.  This visitor will be contacted when the time is appropriate for the visitor to come back in the pre-op area.  Please refer to the [facility] website for the visitor guidelines for Inpatients (after your surgery is over and you are in a regular room).  You are not required to quarantine at this time prior to your surgery. However, you must do this: Hand Hygiene often Do NOT share personal items Notify your provider if you are in close contact with someone who has COVID or you develop fever 100.4 or greater, new onset of sneezing, cough, sore throat, shortness of breath or body aches.  If you test positive for Covid or have been in contact with anyone that has tested positive in the last 10 days please notify you surgeon.    Your procedure is scheduled on:  Thursday August 07, 2022  Report to [facility] Main Entrance: [address] entrance where the [employer] is available.   Report to admitting at:  05:15 AM  +++++Call this number if you have any questions or problems the morning of surgery [phone]  Do not eat food after Midnight the night prior to your surgery/procedure.  After Midnight you may have the following liquids until  04:30 AM  DAY OF SURGERY  Clear Liquid Diet Water Black Coffee (sugar ok, NO MILK/CREAM OR CREAMERS)  Tea (sugar ok, NO  MILK/CREAM OR CREAMERS) regular and decaf                             Plain Jell-O  with no fruit (NO RED)                                           Fruit ices (not with fruit pulp, NO RED)                                     Popsicles (NO RED)                                                                  Juice: apple, WHITE grape, WHITE cranberry Sports drinks like Gatorade or Powerade (NO RED)                    The day of surgery:  Drink ONE (1) Pre-Surgery G2 at  04:30 AM the morning of surgery. Drink in one sitting. Do not sip.  This drink was given to you during your hospital pre-op appointment visit. Nothing else to drink after completing the Pre-Surgery G2 : No candy, chewing gum or throat lozenges.    FOLLOW ANY ADDITIONAL PRE OP INSTRUCTIONS YOU RECEIVED FROM YOUR SURGEON'S OFFICE!!!   Oral Hygiene is also important to reduce your risk of infection.        Remember - BRUSH YOUR TEETH THE MORNING OF SURGERY WITH YOUR REGULAR TOOTHPASTE  Take ONLY these medicines the morning of surgery with A SIP OF WATER: duloxetine (Cymbalta, pregabalin (Lyrica), clonazepam (Klonopin), Cetirizine (Zyrtec). You may take Tylenol for pain.                   You may not have any metal on your body including hair pins, jewelry, and body piercing  Do not wear make-up, lotions, powders, perfumes or deodorant  Do not wear nail polish including gel and S&S, artificial / acrylic nails, or any other type of covering on natural nails including finger and toenails. If you have artificial nails, gel coating, etc., that needs to be removed by a nail salon, Please have this removed prior to surgery. Not doing so may mean that your surgery could be cancelled or delayed if the Surgeon or anesthesia staff feels like they are unable to monitor you safely.   Do not shave 48 hours prior to surgery to avoid nicks in your skin which may contribute to postoperative infections.   You may bring a small overnight  bag with you on the day of surgery, only pack items that are not valuable. [facility] IS NOT RESPONSIBLE   FOR VALUABLES THAT ARE LOST OR STOLEN.   Do not bring your home medications to the hospital. The Pharmacy will dispense medications listed on your medication list to you during your admission in the Hospital.  Special Instructions: Bring a copy of your healthcare power of attorney and living will documents the day of surgery, if you wish to have them scanned into your [facility] Records- EPIC  Please read over the following fact sheets you were given: IF YOU HAVE QUESTIONS ABOUT YOUR PRE-OP INSTRUCTIONS, PLEASE CALL [phone]  ([place])   [facility] - Preparing for Surgery Before surgery, you can play an important role.  Because skin is not sterile, your skin needs to be as free of germs as possible.  You can reduce the number of germs on your skin by washing with CHG (chlorahexidine gluconate) soap before surgery.  CHG is an antiseptic cleaner which kills germs and bonds with the skin to continue killing germs even after washing. Please DO NOT use if you have an allergy to CHG or antibacterial soaps.  If your skin becomes reddened/irritated stop using the CHG and inform your nurse when you arrive at Short Stay. Do not shave (including legs and underarms) for at least 48 hours prior to the first CHG shower.  You may shave your face/neck.  Please follow these instructions carefully:  1.  Shower with CHG Soap the night before surgery and the  morning of surgery.  2.  If you choose to wash your hair, wash your hair first as usual with your normal  shampoo.  3.  After you shampoo, rinse your hair and body thoroughly to remove the shampoo.                             4.  Use CHG as you  would any other liquid soap.  You can apply chg directly to the skin and wash.  Gently with a scrungie or clean washcloth.  5.  Apply the CHG Soap to your body ONLY FROM THE NECK DOWN.   Do not use on face/  open                           Wound or open sores. Avoid contact with eyes, ears mouth and genitals (private parts).                       Wash face,  Genitals (private parts) with your normal soap.             6.  Wash thoroughly, paying special attention to the area where your  surgery  will be performed.  7.  Thoroughly rinse your body with warm water from the neck down.  8.  DO NOT shower/wash with your normal soap after using and rinsing off the CHG Soap.            9.  Pat yourself dry with a clean towel.            10.  Wear clean pajamas.            11.  Place clean sheets on your bed the night of your first shower and do not  sleep with pets.  ON THE DAY OF SURGERY : Do not apply any lotions/deodorants the morning of surgery.  Please wear clean clothes to the hospital/surgery center.      FAILURE TO FOLLOW THESE INSTRUCTIONS MAY RESULT IN THE CANCELLATION OF YOUR SURGERY  PATIENT SIGNATURE_________________________________  NURSE SIGNATURE__________________________________  ________________________________________________________________________        Adam Phenix    An incentive spirometer is a tool that can help keep your lungs clear and active. This tool measures how well you are filling your lungs with each breath. Taking long deep breaths may help reverse or decrease the chance of developing breathing (pulmonary) problems (especially infection) following: A long period of time when you are unable to move or be active. BEFORE THE PROCEDURE  If the spirometer includes an indicator to show your best effort, your nurse or respiratory therapist will set it to a desired goal. If possible, sit up straight or lean slightly forward. Try not to slouch. Hold the incentive spirometer in an upright position. INSTRUCTIONS FOR USE  Sit on the edge of your bed if possible, or sit up as far as you can in bed or on a chair. Hold the incentive spirometer in an upright  position. Breathe out normally. Place the mouthpiece in your mouth and seal your lips tightly around it. Breathe in slowly and as deeply as possible, raising the piston or the ball toward the top of the column. Hold your breath for 3-5 seconds or for as long as possible. Allow the piston or ball to fall to the bottom of the column. Remove the mouthpiece from your mouth and breathe out normally. Rest for a few seconds and repeat Steps 1 through 7 at least 10 times every 1-2 hours when you are awake. Take your time and take a few normal breaths between deep breaths. The spirometer may include an indicator to show your best effort. Use the indicator as a goal to work toward during each repetition. After each set of 10 deep breaths, practice coughing to be sure your lungs  are clear. If you have an incision (the cut made at the time of surgery), support your incision when coughing by placing a pillow or rolled up towels firmly against it. Once you are able to get out of bed, walk around indoors and cough well. You may stop using the incentive spirometer when instructed by your caregiver.  RISKS AND COMPLICATIONS Take your time so you do not get dizzy or light-headed. If you are in pain, you may need to take or ask for pain medication before doing incentive spirometry. It is harder to take a deep breath if you are having pain. AFTER USE Rest and breathe slowly and easily. It can be helpful to keep track of a log of your progress. Your caregiver can provide you with a simple table to help with this. If you are using the spirometer at home, follow these instructions: [place] IF:  You are having difficultly using the spirometer. You have trouble using the spirometer as often as instructed. Your pain medication is not giving enough relief while using the spirometer. You develop fever of 100.5 F (38.1 C) or higher.                                                                                                     SEEK IMMEDIATE MEDICAL CARE IF:  You cough up bloody sputum that had not been present before. You develop fever of 102 F (38.9 C) or greater. You develop worsening pain at or near the incision site. MAKE SURE YOU:  Understand these instructions. Will watch your condition. Will get help right away if you are not doing well or get worse. Document Released: 11/03/2006 Document Revised: 09/15/2011 Document Reviewed: 01/04/2007 [facility] Patient Information 2014 [place], [place].      WHAT IS A BLOOD TRANSFUSION? Blood Transfusion Information  A transfusion is the replacement of blood or some of its parts. Blood is made up of multiple cells which provide different functions. Red blood cells carry oxygen and are used for blood loss replacement. White blood cells fight against infection. Platelets control bleeding. Plasma helps clot blood. Other blood products are available for specialized needs, such as hemophilia or other clotting disorders. BEFORE THE TRANSFUSION  Who gives blood for transfusions?  Healthy volunteers who are fully evaluated to make sure their blood is safe. This is blood bank blood. Transfusion therapy is the safest it has ever been in the practice of medicine. Before blood is taken from a donor, a complete history is taken to make sure that person has no history of diseases nor engages in risky social behavior (examples are intravenous drug use or sexual activity with multiple partners). The donor's travel history is screened to minimize risk of transmitting infections, such as malaria. The donated blood is tested for signs of infectious diseases, such as HIV and hepatitis. The blood is then tested to be sure it is compatible with you in order to minimize the chance of a transfusion reaction. If you or a relative donates blood, this is often done in anticipation of surgery and is not  appropriate for emergency situations. It takes many days to process the  donated blood. RISKS AND COMPLICATIONS Although transfusion therapy is very safe and saves many lives, the main dangers of transfusion include:  Getting an infectious disease. Developing a transfusion reaction. This is an allergic reaction to something in the blood you were given. Every precaution is taken to prevent this. The decision to have a blood transfusion has been considered carefully by your caregiver before blood is given. Blood is not given unless the benefits outweigh the risks. AFTER THE TRANSFUSION Right after receiving a blood transfusion, you will usually feel much better and more energetic. This is especially true if your red blood cells have gotten low (anemic). The transfusion raises the level of the red blood cells which carry oxygen, and this usually causes an energy increase. The nurse administering the transfusion will monitor you carefully for complications. HOME CARE INSTRUCTIONS  No special instructions are needed after a transfusion. You may find your energy is better. Speak with your caregiver about any limitations on activity for underlying diseases you may have. SEEK MEDICAL CARE IF:  Your condition is not improving after your transfusion. You develop redness or irritation at the intravenous (IV) site. SEEK IMMEDIATE MEDICAL CARE IF:  Any of the following symptoms occur over the next 12 hours: Shaking chills. You have a temperature by mouth above 102 F (38.9 C), not controlled by medicine. Chest, back, or muscle pain. People around you feel you are not acting correctly or are confused. Shortness of breath or difficulty breathing. Dizziness and fainting. You get a rash or develop hives. You have a decrease in urine output. Your urine turns a dark color or changes to pink, red, or brown. Any of the following symptoms occur over the next 10 days: You have a temperature by mouth above 102 F (38.9 C), not controlled by medicine. Shortness of breath. Weakness  after normal activity. The white part of the eye turns yellow (jaundice). You have a decrease in the amount of urine or are urinating less often. Your urine turns a dark color or changes to pink, red, or brown. Document Released: 06/20/2000 Document Revised: 09/15/2011 Document Reviewed: 02/07/2008 [facility] Patient Information 2014 [place], [place].  _______________________________________________________________________

## 2022-07-31 NOTE — Progress Notes (Addendum)
COVID Vaccine received:  []  No [x]  Yes Date of any COVID positive Test in last 90 days:  None  PCP - Ronette Deter, PA-C  at [facility]  Gastroenterology- Chryl Heck, MD  Cardiologist - none Neurologist- Arlice Colt, MD  Chest x-ray -  EKG -  (09-03-2020  epic)  will repeat at PST Stress Test -  ECHO -  Cardiac Cath -   PCR screen: [x]  Ordered & Completed                      []   No Order but Needs PROFEND                      []   N/A for this surgery  Surgery Plan:  []  Ambulatory                            []  Outpatient in bed                            [x]  Admit  Anesthesia:    [x]  General  []  Spinal                           []   Choice []   MAC  Pacemaker / ICD device [x]  No []  Yes        Device order form faxed [x]  No    []   Yes      Faxed to:  Spinal Cord Stimulator:[x]  No []  Yes      (Remind patient to bring remote DOS) Other Implants:   History of Sleep Apnea? []  No [x]  Yes  mild CPAP used?- [x]  No []  Yes    Does the patient monitor blood sugar? [x]  No []  Yes  []  N/A Patient denies Pre-DM, she allowed me to do a CBG. She states that she has lost weight and doesn't have any problems with blood sugar.  Patient has: [x]  Pre-DM   []  DM1  []   DM2  Blood Thinner / Instructions:None Aspirin Instructions:  None  ERAS Protocol Ordered: []  No  [x]  Yes PRE-SURGERY []  ENSURE  [x]  G2  Patient is to be NPO after: 04:30 am  Comments: Patient is allergic to Benzoyl Peroxide and is refusing to use it., so I removed that section out of her surgery instructions.  Activity level: Patient can climb a flight of stairs without difficulty; [x]  No CP  but would have SOB.  Patient can sometimes perform ADLs without assistance; she has MS.   Anesthesia review: MS, HTN, s/p ACDF (12-21-2014  C5-6, C6-7), also Lumbar fusion at L5-S1 in 2014. Anemia, mild OSA (no CPAP), Pre-DM last A1c? 6.0, no meds, diet only. PONV  Patient denies shortness of breath, fever, cough and  chest pain at PAT appointment.  Patient verbalized understanding and agreement to the Pre-Surgical Instructions that were given to them at this PAT appointment. Patient was also educated of the need to review these PAT instructions again prior to his/her surgery.I reviewed the appropriate phone numbers to call if they have any and questions or concerns.

## 2022-08-01 ENCOUNTER — Encounter (HOSPITAL_COMMUNITY): Payer: Self-pay

## 2022-08-01 ENCOUNTER — Encounter (HOSPITAL_COMMUNITY)
Admission: RE | Admit: 2022-08-01 | Discharge: 2022-08-01 | Disposition: A | Discharge status: Home / Self Care | Payer: Medicare Other | Source: Ambulatory Visit | Attending: Orthopedic Surgery | Admitting: Orthopedic Surgery

## 2022-08-01 ENCOUNTER — Other Ambulatory Visit: Payer: Self-pay

## 2022-08-01 VITALS — BP 121/68 | HR 69 | Temp 98.2°F | Resp 18 | Ht 69.0 in | Wt 238.0 lb

## 2022-08-01 DIAGNOSIS — R7303 Prediabetes: Secondary | ICD-10-CM | POA: Diagnosis not present

## 2022-08-01 DIAGNOSIS — I10 Essential (primary) hypertension: Secondary | ICD-10-CM

## 2022-08-01 DIAGNOSIS — D171 Benign lipomatous neoplasm of skin and subcutaneous tissue of trunk: Secondary | ICD-10-CM

## 2022-08-01 DIAGNOSIS — Z01818 Encounter for other preprocedural examination: Secondary | ICD-10-CM | POA: Diagnosis present

## 2022-08-01 LAB — BASIC METABOLIC PANEL
Anion gap: 8 (ref 5–15)
BUN: 19 mg/dL (ref 8–23)
CO2: 29 mmol/L (ref 22–32)
Calcium: 9.1 mg/dL (ref 8.9–10.3)
Chloride: 105 mmol/L (ref 98–111)
Creatinine, Ser: 0.74 mg/dL (ref 0.44–1.00)
GFR, Estimated: >60 mL/min (ref >60)
Glucose, Bld: 89 mg/dL (ref 70–99)
Potassium: 3.8 mmol/L (ref 3.5–5.1)
Sodium: 142 mmol/L (ref 135–145)

## 2022-08-01 LAB — CBC
HCT: 40.3 % (ref 36.0–46.0)
Hemoglobin: 13.3 g/dL (ref 12.0–15.0)
MCH: 29.3 pg (ref 26.0–34.0)
MCHC: 33 g/dL (ref 30.0–36.0)
MCV: 88.8 fL (ref 80.0–100.0)
Platelets: 255 10*3/uL (ref 150–400)
RBC: 4.54 MIL/uL (ref 3.87–5.11)
RDW: 14.8 % (ref 11.5–15.5)
WBC: 5.3 10*3/uL (ref 4.0–10.5)
nRBC: 0.8 % — ABNORMAL HIGH (ref 0.0–0.2)

## 2022-08-01 LAB — HEMOGLOBIN A1C
Hgb A1c MFr Bld: 5.4 % (ref 4.8–5.6)
Mean Plasma Glucose: 108.28 mg/dL

## 2022-08-01 LAB — SURGICAL PCR SCREEN
MRSA, PCR: NEGATIVE
Staphylococcus aureus: NEGATIVE

## 2022-08-01 LAB — GLUCOSE, CAPILLARY: Glucose-Capillary: 106 mg/dL — ABNORMAL HIGH (ref 70–99)

## 2022-08-06 NOTE — Anesthesia Preprocedure Evaluation (Signed)
Anesthesia Evaluation  Patient identified by MRN, date of birth, ID band Patient awake    Reviewed: Allergy & Precautions, H&P , NPO status , Patient's Chart, lab work & pertinent test results  History of Anesthesia Complications (+) PONV and history of anesthetic complications  Airway Mallampati: II  TM Distance: >3 FB Neck ROM: Full    Dental no notable dental hx. (+) Teeth Intact, Dental Advisory Given   Pulmonary sleep apnea    Pulmonary exam normal breath sounds clear to auscultation       Cardiovascular Exercise Tolerance: Good hypertension, Pt. on medications  Rhythm:Regular Rate:Normal     Neuro/Psych  Headaches  Anxiety Depression       GI/Hepatic Neg liver ROS,GERD  Medicated,,  Endo/Other    Morbid obesity  Renal/GU negative Renal ROS  negative genitourinary   Musculoskeletal  (+) Arthritis ,    Abdominal   Peds  Hematology  (+) Blood dyscrasia, anemia   Anesthesia Other Findings   Reproductive/Obstetrics negative OB ROS                             Anesthesia Physical Anesthesia Plan  ASA: 3  Anesthesia Plan: General   Post-op Pain Management: Regional block* and Tylenol PO (pre-op)*   Induction: Intravenous  PONV Risk Score and Plan: 4 or greater and Ondansetron, Dexamethasone, Scopolamine patch - Pre-op and Midazolam  Airway Management Planned: Oral ETT  Additional Equipment:   Intra-op Plan:   Post-operative Plan: Extubation in OR  Informed Consent: I have reviewed the patients History and Physical, chart, labs and discussed the procedure including the risks, benefits and alternatives for the proposed anesthesia with the patient or authorized representative who has indicated his/her understanding and acceptance.     Dental advisory given  Plan Discussed with: CRNA  Anesthesia Plan Comments:        Anesthesia Quick Evaluation

## 2022-08-07 ENCOUNTER — Other Ambulatory Visit: Payer: Self-pay

## 2022-08-07 ENCOUNTER — Inpatient Hospital Stay (HOSPITAL_COMMUNITY)
Admission: RE | Admit: 2022-08-07 | Discharge: 2022-08-07 | DRG: 483 | Disposition: A | Discharge status: Home / Self Care | Payer: Medicare Other | Attending: Orthopedic Surgery | Admitting: Orthopedic Surgery

## 2022-08-07 ENCOUNTER — Inpatient Hospital Stay (HOSPITAL_COMMUNITY): Payer: Medicare Other | Admitting: Physician Assistant

## 2022-08-07 ENCOUNTER — Inpatient Hospital Stay (HOSPITAL_BASED_OUTPATIENT_CLINIC_OR_DEPARTMENT_OTHER): Payer: Medicare Other | Admitting: Anesthesiology

## 2022-08-07 ENCOUNTER — Encounter (HOSPITAL_COMMUNITY): Payer: Self-pay | Admitting: Orthopedic Surgery

## 2022-08-07 ENCOUNTER — Encounter (HOSPITAL_COMMUNITY)
Admission: RE | Disposition: A | Discharge status: Home / Self Care | Payer: Self-pay | Source: Home / Self Care | Attending: Orthopedic Surgery

## 2022-08-07 DIAGNOSIS — K219 Gastro-esophageal reflux disease without esophagitis: Secondary | ICD-10-CM | POA: Diagnosis present

## 2022-08-07 DIAGNOSIS — T84038A Mechanical loosening of other internal prosthetic joint, initial encounter: Secondary | ICD-10-CM | POA: Diagnosis present

## 2022-08-07 DIAGNOSIS — I10 Essential (primary) hypertension: Secondary | ICD-10-CM

## 2022-08-07 DIAGNOSIS — F32A Depression, unspecified: Secondary | ICD-10-CM | POA: Diagnosis present

## 2022-08-07 DIAGNOSIS — T8484XA Pain due to internal orthopedic prosthetic devices, implants and grafts, initial encounter: Secondary | ICD-10-CM | POA: Diagnosis present

## 2022-08-07 DIAGNOSIS — Y792 Prosthetic and other implants, materials and accessory orthopedic devices associated with adverse incidents: Secondary | ICD-10-CM | POA: Diagnosis present

## 2022-08-07 DIAGNOSIS — Z818 Family history of other mental and behavioral disorders: Secondary | ICD-10-CM | POA: Diagnosis not present

## 2022-08-07 DIAGNOSIS — F419 Anxiety disorder, unspecified: Secondary | ICD-10-CM | POA: Diagnosis present

## 2022-08-07 DIAGNOSIS — Z6835 Body mass index (BMI) 35.0-35.9, adult: Secondary | ICD-10-CM

## 2022-08-07 DIAGNOSIS — Y838 Other surgical procedures as the cause of abnormal reaction of the patient, or of later complication, without mention of misadventure at the time of the procedure: Secondary | ICD-10-CM | POA: Diagnosis not present

## 2022-08-07 DIAGNOSIS — E781 Pure hyperglyceridemia: Secondary | ICD-10-CM | POA: Diagnosis present

## 2022-08-07 DIAGNOSIS — Z96611 Presence of right artificial shoulder joint: Secondary | ICD-10-CM | POA: Diagnosis present

## 2022-08-07 DIAGNOSIS — D649 Anemia, unspecified: Secondary | ICD-10-CM | POA: Diagnosis not present

## 2022-08-07 DIAGNOSIS — Z96651 Presence of right artificial knee joint: Secondary | ICD-10-CM | POA: Diagnosis present

## 2022-08-07 DIAGNOSIS — Z96612 Presence of left artificial shoulder joint: Secondary | ICD-10-CM | POA: Insufficient documentation

## 2022-08-07 DIAGNOSIS — Z8261 Family history of arthritis: Secondary | ICD-10-CM

## 2022-08-07 DIAGNOSIS — Z8249 Family history of ischemic heart disease and other diseases of the circulatory system: Secondary | ICD-10-CM

## 2022-08-07 DIAGNOSIS — Z833 Family history of diabetes mellitus: Secondary | ICD-10-CM | POA: Diagnosis not present

## 2022-08-07 DIAGNOSIS — Z79899 Other long term (current) drug therapy: Secondary | ICD-10-CM | POA: Diagnosis not present

## 2022-08-07 DIAGNOSIS — G4730 Sleep apnea, unspecified: Secondary | ICD-10-CM | POA: Diagnosis present

## 2022-08-07 DIAGNOSIS — G35 Multiple sclerosis: Secondary | ICD-10-CM | POA: Diagnosis present

## 2022-08-07 DIAGNOSIS — R7303 Prediabetes: Secondary | ICD-10-CM

## 2022-08-07 DIAGNOSIS — Z981 Arthrodesis status: Secondary | ICD-10-CM

## 2022-08-07 DIAGNOSIS — G2581 Restless legs syndrome: Secondary | ICD-10-CM | POA: Diagnosis present

## 2022-08-07 DIAGNOSIS — Z96619 Presence of unspecified artificial shoulder joint: Secondary | ICD-10-CM

## 2022-08-07 HISTORY — PX: TOTAL SHOULDER REVISION: SHX6130

## 2022-08-07 LAB — TYPE AND SCREEN
ABO/RH(D): O POS
Antibody Screen: NEGATIVE

## 2022-08-07 SURGERY — REVISION, TOTAL ARTHROPLASTY, SHOULDER
Anesthesia: General | Site: Shoulder | Laterality: Left

## 2022-08-07 MED ORDER — EPHEDRINE 5 MG/ML INJ
INTRAVENOUS | Status: AC
  Filled 2022-08-07: qty 10

## 2022-08-07 MED ORDER — STERILE WATER FOR IRRIGATION IR SOLN
Status: DC | PRN
  Administered 2022-08-07: 2000 mL

## 2022-08-07 MED ORDER — CEFAZOLIN SODIUM-DEXTROSE 2-4 GM/100ML-% IV SOLN
2.0000 g | INTRAVENOUS | Status: DC
  Filled 2022-08-07: qty 100

## 2022-08-07 MED ORDER — LIDOCAINE HCL (PF) 2 % IJ SOLN
INTRAMUSCULAR | Status: AC
  Filled 2022-08-07: qty 5

## 2022-08-07 MED ORDER — ONDANSETRON HCL 4 MG PO TABS: 4.0000 mg | ORAL_TABLET | Freq: Three times a day (TID) | ORAL | 0 refills | Status: DC | PRN

## 2022-08-07 MED ORDER — 0.9 % SODIUM CHLORIDE (POUR BTL) OPTIME
TOPICAL | Status: DC | PRN
  Administered 2022-08-07: 1000 mL

## 2022-08-07 MED ORDER — MIDAZOLAM HCL 2 MG/2ML IJ SOLN
INTRAMUSCULAR | Status: AC
  Filled 2022-08-07: qty 2

## 2022-08-07 MED ORDER — VANCOMYCIN HCL 1000 MG IV SOLR
INTRAVENOUS | Status: AC
  Filled 2022-08-07: qty 20

## 2022-08-07 MED ORDER — ONDANSETRON HCL 4 MG PO TABS: 4.0000 mg | ORAL_TABLET | Freq: Four times a day (QID) | ORAL | Status: DC | PRN

## 2022-08-07 MED ORDER — METOCLOPRAMIDE HCL 5 MG PO TABS: 5.0000 mg | ORAL_TABLET | Freq: Three times a day (TID) | ORAL | Status: DC | PRN

## 2022-08-07 MED ORDER — BUPIVACAINE LIPOSOME 1.3 % IJ SUSP
INTRAMUSCULAR | Status: DC | PRN
  Administered 2022-08-07: 10 mL via PERINEURAL

## 2022-08-07 MED ORDER — CLINDAMYCIN PHOSPHATE 900 MG/50ML IV SOLN
INTRAVENOUS | Status: AC
  Filled 2022-08-07: qty 50

## 2022-08-07 MED ORDER — EPHEDRINE SULFATE (PRESSORS) 50 MG/ML IJ SOLN
INTRAMUSCULAR | Status: DC | PRN
  Administered 2022-08-07 (×5): 5 mg via INTRAVENOUS

## 2022-08-07 MED ORDER — ONDANSETRON HCL 4 MG/2ML IJ SOLN: 4.0000 mg | Freq: Four times a day (QID) | INTRAMUSCULAR | Status: DC | PRN

## 2022-08-07 MED ORDER — PROPOFOL 10 MG/ML IV BOLUS
INTRAVENOUS | Status: AC
  Filled 2022-08-07: qty 20

## 2022-08-07 MED ORDER — FENTANYL CITRATE PF 50 MCG/ML IJ SOSY: 25.0000 ug | PREFILLED_SYRINGE | INTRAMUSCULAR | Status: DC | PRN

## 2022-08-07 MED ORDER — METOCLOPRAMIDE HCL 5 MG/ML IJ SOLN: 5.0000 mg | Freq: Three times a day (TID) | INTRAMUSCULAR | Status: DC | PRN

## 2022-08-07 MED ORDER — GLYCOPYRROLATE 0.2 MG/ML IJ SOLN
INTRAMUSCULAR | Status: AC
  Filled 2022-08-07: qty 1

## 2022-08-07 MED ORDER — TRANEXAMIC ACID-NACL 1000-0.7 MG/100ML-% IV SOLN
1000.0000 mg | INTRAVENOUS | Status: AC
  Administered 2022-08-07: 1000 mg via INTRAVENOUS
  Filled 2022-08-07: qty 100

## 2022-08-07 MED ORDER — PHENYLEPHRINE HCL-NACL 20-0.9 MG/250ML-% IV SOLN
INTRAVENOUS | Status: DC | PRN
  Administered 2022-08-07: 30 ug/min via INTRAVENOUS

## 2022-08-07 MED ORDER — HYDROMORPHONE HCL 2 MG PO TABS: 2.0000 mg | ORAL_TABLET | ORAL | 0 refills | Status: DC | PRN

## 2022-08-07 MED ORDER — DEXAMETHASONE SODIUM PHOSPHATE 10 MG/ML IJ SOLN
INTRAMUSCULAR | Status: DC | PRN
  Administered 2022-08-07: 10 mg via INTRAVENOUS

## 2022-08-07 MED ORDER — FENTANYL CITRATE (PF) 100 MCG/2ML IJ SOLN
INTRAMUSCULAR | Status: DC | PRN
  Administered 2022-08-07 (×2): 50 ug via INTRAVENOUS

## 2022-08-07 MED ORDER — CHLORHEXIDINE GLUCONATE 0.12 % MT SOLN
15.0000 mL | Freq: Once | OROMUCOSAL | Status: AC
  Administered 2022-08-07: 15 mL via OROMUCOSAL

## 2022-08-07 MED ORDER — ROCURONIUM BROMIDE 10 MG/ML (PF) SYRINGE
PREFILLED_SYRINGE | INTRAVENOUS | Status: AC
  Filled 2022-08-07: qty 10

## 2022-08-07 MED ORDER — PHENYLEPHRINE 80 MCG/ML (10ML) SYRINGE FOR IV PUSH (FOR BLOOD PRESSURE SUPPORT)
PREFILLED_SYRINGE | INTRAVENOUS | Status: AC
  Filled 2022-08-07: qty 20

## 2022-08-07 MED ORDER — SCOPOLAMINE 1 MG/3DAYS TD PT72
MEDICATED_PATCH | TRANSDERMAL | Status: AC
  Filled 2022-08-07: qty 1

## 2022-08-07 MED ORDER — LACTATED RINGERS IV BOLUS: 250.0000 mL | Freq: Once | INTRAVENOUS | Status: DC

## 2022-08-07 MED ORDER — ROCURONIUM BROMIDE 100 MG/10ML IV SOLN
INTRAVENOUS | Status: DC | PRN
  Administered 2022-08-07: 50 mg via INTRAVENOUS

## 2022-08-07 MED ORDER — BUPIVACAINE-EPINEPHRINE (PF) 0.5% -1:200000 IJ SOLN
INTRAMUSCULAR | Status: DC | PRN
  Administered 2022-08-07: 15 mL via PERINEURAL

## 2022-08-07 MED ORDER — PHENYLEPHRINE HCL (PRESSORS) 10 MG/ML IV SOLN
INTRAVENOUS | Status: AC
  Filled 2022-08-07: qty 1

## 2022-08-07 MED ORDER — TOBRAMYCIN SULFATE 1.2 G IJ SOLR
INTRAMUSCULAR | Status: AC
  Filled 2022-08-07: qty 1.2

## 2022-08-07 MED ORDER — ORAL CARE MOUTH RINSE: 15.0000 mL | Freq: Once | OROMUCOSAL | Status: AC

## 2022-08-07 MED ORDER — LACTATED RINGERS IV SOLN: INTRAVENOUS | Status: DC

## 2022-08-07 MED ORDER — MIDAZOLAM HCL 5 MG/5ML IJ SOLN
INTRAMUSCULAR | Status: DC | PRN
  Administered 2022-08-07: 2 mg via INTRAVENOUS

## 2022-08-07 MED ORDER — ONDANSETRON HCL 4 MG/2ML IJ SOLN
INTRAMUSCULAR | Status: AC
  Filled 2022-08-07: qty 2

## 2022-08-07 MED ORDER — DEXAMETHASONE SODIUM PHOSPHATE 10 MG/ML IJ SOLN
INTRAMUSCULAR | Status: AC
  Filled 2022-08-07: qty 1

## 2022-08-07 MED ORDER — TOBRAMYCIN SULFATE 1.2 G IJ SOLR
1.2000 g | INTRAMUSCULAR | Status: AC
  Administered 2022-08-07: 1.2 g via TOPICAL

## 2022-08-07 MED ORDER — CLINDAMYCIN PHOSPHATE 900 MG/50ML IV SOLN
INTRAVENOUS | Status: DC | PRN
  Administered 2022-08-07: 900 mg via INTRAVENOUS

## 2022-08-07 MED ORDER — FENTANYL CITRATE (PF) 100 MCG/2ML IJ SOLN
INTRAMUSCULAR | Status: AC
  Filled 2022-08-07: qty 2

## 2022-08-07 MED ORDER — ACETAMINOPHEN 500 MG PO TABS
1000.0000 mg | ORAL_TABLET | Freq: Once | ORAL | Status: AC
  Administered 2022-08-07: 1000 mg via ORAL
  Filled 2022-08-07: qty 2

## 2022-08-07 MED ORDER — SUGAMMADEX SODIUM 500 MG/5ML IV SOLN
INTRAVENOUS | Status: DC | PRN
  Administered 2022-08-07: 200 mg via INTRAVENOUS

## 2022-08-07 MED ORDER — ONDANSETRON HCL 4 MG/2ML IJ SOLN
INTRAMUSCULAR | Status: DC | PRN
  Administered 2022-08-07: 4 mg via INTRAVENOUS

## 2022-08-07 MED ORDER — PHENYLEPHRINE HCL (PRESSORS) 10 MG/ML IV SOLN
INTRAVENOUS | Status: DC | PRN
  Administered 2022-08-07: 80 ug via INTRAVENOUS

## 2022-08-07 MED ORDER — GLYCOPYRROLATE 0.2 MG/ML IJ SOLN
INTRAMUSCULAR | Status: DC | PRN
  Administered 2022-08-07 (×2): .1 mg via INTRAVENOUS

## 2022-08-07 MED ORDER — PROPOFOL 10 MG/ML IV BOLUS
INTRAVENOUS | Status: DC | PRN
  Administered 2022-08-07: 150 mg via INTRAVENOUS

## 2022-08-07 SURGICAL SUPPLY — 78 items
BAG COUNTER SPONGE SURGICOUNT (BAG) IMPLANT
BAG ZIPLOCK 12X15 (MISCELLANEOUS) ×1 IMPLANT
BIT DRILL AR 3 (BIT) ×1
BIT DRILL AR 3 NS (BIT) IMPLANT
BLADE SAW SGTL 83.5X18.5 (BLADE) IMPLANT
CEMENT BONE DEPUY (Cement) IMPLANT
COOLER ICEMAN CLASSIC (MISCELLANEOUS) IMPLANT
COVER BACK TABLE 60X90IN (DRAPES) ×1 IMPLANT
COVER SURGICAL LIGHT HANDLE (MISCELLANEOUS) ×1 IMPLANT
CUP SUT UNIV REVERS 36 NEUTRAL (Cup) IMPLANT
DERMABOND ADVANCED .7 DNX12 (GAUZE/BANDAGES/DRESSINGS) ×1 IMPLANT
DRAPE INCISE IOBAN 66X45 STRL (DRAPES) IMPLANT
DRAPE ORTHO SPLIT 77X108 STRL (DRAPES) ×2
DRAPE SHEET LG 3/4 BI-LAMINATE (DRAPES) ×1 IMPLANT
DRAPE SURG 17X11 SM STRL (DRAPES) ×1 IMPLANT
DRAPE SURG ORHT 6 SPLT 77X108 (DRAPES) ×2 IMPLANT
DRAPE U-SHAPE 47X51 STRL (DRAPES) ×1 IMPLANT
DRESSING AQUACEL AG SP 3.5X10 (GAUZE/BANDAGES/DRESSINGS) IMPLANT
DRILL SURG AR 10 (DRILL) IMPLANT
DRSG AQUACEL AG ADV 3.5X10 (GAUZE/BANDAGES/DRESSINGS) ×1 IMPLANT
DRSG AQUACEL AG SP 3.5X10 (GAUZE/BANDAGES/DRESSINGS) ×1
DRSG TEGADERM 8X12 (GAUZE/BANDAGES/DRESSINGS) ×1 IMPLANT
DURAPREP 26ML APPLICATOR (WOUND CARE) ×1 IMPLANT
ELECT BLADE TIP CTD 4 INCH (ELECTRODE) ×1 IMPLANT
ELECT PENCIL ROCKER SW 15FT (MISCELLANEOUS) ×1 IMPLANT
ELECT REM PT RETURN 15FT ADLT (MISCELLANEOUS) ×1 IMPLANT
FACESHIELD WRAPAROUND (MASK) ×3 IMPLANT
FACESHIELD WRAPAROUND OR TEAM (MASK) ×3 IMPLANT
GLENOID UNI REV MOD 24 +2 LAT (Joint) IMPLANT
GLENOSPHERE 36 +4 LAT/24 (Joint) IMPLANT
GLOVE BIO SURGEON STRL SZ7 (GLOVE) ×1 IMPLANT
GLOVE BIO SURGEON STRL SZ7.5 (GLOVE) ×1 IMPLANT
GLOVE BIO SURGEON STRL SZ8 (GLOVE) ×1 IMPLANT
GLOVE BIOGEL PI IND STRL 7.0 (GLOVE) ×1 IMPLANT
GLOVE BIOGEL PI IND STRL 8 (GLOVE) ×1 IMPLANT
GOWN STRL SURGICAL XL XLNG (GOWN DISPOSABLE) ×2 IMPLANT
HANDLE SUCTION POOLE (INSTRUMENTS) IMPLANT
INSERT HUMERAL UNI REVERS 36 6 (Insert) IMPLANT
KIT BASIN OR (CUSTOM PROCEDURE TRAY) ×1 IMPLANT
KIT TURNOVER KIT A (KITS) IMPLANT
MANIFOLD NEPTUNE II (INSTRUMENTS) ×1 IMPLANT
NDL TAPERED W/ NITINOL LOOP (MISCELLANEOUS) IMPLANT
NEEDLE TAPERED W/ NITINOL LOOP (MISCELLANEOUS) IMPLANT
NS IRRIG 1000ML POUR BTL (IV SOLUTION) ×1 IMPLANT
PACK SHOULDER (CUSTOM PROCEDURE TRAY) ×1 IMPLANT
PAD ARMBOARD 7.5X6 YLW CONV (MISCELLANEOUS) ×2 IMPLANT
PAD COLD SHLDR WRAP-ON (PAD) IMPLANT
PIN SET MODULAR GLENOID SYSTEM (PIN) IMPLANT
PROTECTOR NERVE ULNAR (MISCELLANEOUS) ×1 IMPLANT
PUTTY DBM STAGRAFT PLUS 5CC (Putty) IMPLANT
RESTRAINT HEAD UNIVERSAL NS (MISCELLANEOUS) ×1 IMPLANT
SCREW CENTRAL MOD 30MM (Screw) IMPLANT
SCREW PERI LOCK 5.5X24 (Screw) IMPLANT
SLING ARM FOAM STRAP LRG (SOFTGOODS) IMPLANT
SLING ARM FOAM STRAP MED (SOFTGOODS) IMPLANT
SMARTMIX MINI TOWER (MISCELLANEOUS)
SPONGE T-LAP 18X18 ~~LOC~~+RFID (SPONGE) IMPLANT
SPONGE T-LAP 4X18 ~~LOC~~+RFID (SPONGE) IMPLANT
STEM HUMERAL UNIVERS SZ8 (Stem) IMPLANT
SUCTION FRAZIER HANDLE 12FR (TUBING) ×1
SUCTION POOLE HANDLE (INSTRUMENTS)
SUCTION TUBE FRAZIER 12FR DISP (TUBING) ×1 IMPLANT
SUT MNCRL AB 3-0 PS2 18 (SUTURE) ×1 IMPLANT
SUT MON AB 2-0 CT1 36 (SUTURE) ×1 IMPLANT
SUT VIC AB 1 CT1 36 (SUTURE) ×1 IMPLANT
SUT VIC AB 2-0 CT1 27 (SUTURE) ×1
SUT VIC AB 2-0 CT1 TAPERPNT 27 (SUTURE) ×1 IMPLANT
SUTURE TAPE 1.3 40 TPR END (SUTURE) IMPLANT
SUTURETAPE 1.3 40 TPR END (SUTURE)
SWAB COLLECTION DEVICE MRSA (MISCELLANEOUS) IMPLANT
SWAB CULTURE ESWAB REG 1ML (MISCELLANEOUS) IMPLANT
TOWEL OR 17X26 10 PK STRL BLUE (TOWEL DISPOSABLE) ×1 IMPLANT
TOWEL OR NON WOVEN STRL DISP B (DISPOSABLE) ×1 IMPLANT
TOWER SMARTMIX MINI (MISCELLANEOUS) IMPLANT
TUBE SUCTION HIGH CAP CLEAR NV (SUCTIONS) IMPLANT
TUBING CONNECTING 10 (TUBING) IMPLANT
WATER STERILE IRR 1000ML POUR (IV SOLUTION) ×1 IMPLANT
YANKAUER SUCT BULB TIP 10FT TU (MISCELLANEOUS) ×1 IMPLANT

## 2022-08-07 NOTE — Op Note (Signed)
08/07/2022  10:04 AM  PATIENT:   Joan Ray  62 y.o. female  PRE-OPERATIVE DIAGNOSIS:  Failed Left shoulder anatomic arthroplasty  POST-OPERATIVE DIAGNOSIS: Same with intraoperative finding of both humeral stem and glenoid being loose with evidence for subsidence of the humeral stem which likely led to instability with evidence for posterior shoulder subluxation and posteroinferior glenoid erosion  PROCEDURE: Removal of loose left shoulder anatomic arthroplasty and conversion to reverse shoulder arthroplasty implanting an Arthrex size small/+2 glenoid with a 30 mm lag screw, 36/+4 humeral head, size 8 press-fit humeral stem with a neutral metaphysis, +6 constrained polyethylene insert  SURGEON:  Callaway Hardigree, Metta Clines M.D.  ASSISTANTS: Jenetta Loges, PA-C  Jenetta Loges, PA-C was utilized as an Environmental consultant throughout this case, essential for help with positioning the patient, positioning extremity, tissue manipulation, implantation of the prosthesis, suture management, wound closure, and intraoperative decision-making.  ANESTHESIA:   General endotracheal and interscalene block with Exparel  EBL: 300 cc  SPECIMEN: Soft tissue specimens were sent for culture and sensitivity  1.:  Anterior capsule  2.:  Periprosthetic soft tissue  3.  Glenoid soft tissue  Drains: None   PATIENT DISPOSITION:  PACU - hemodynamically stable.    PLAN OF CARE: Discharge to home after PACU  Brief history:  Patient is a 62 year old female well-known to our practice after previous bilateral shoulder anatomic arthroplasties, left shoulder procedure completed in 2020.  She initially did well but over the last several months has been at progressive increasing left shoulder pain without antecedent trauma.  She has no complaints of fevers or chills.  Radiographic evaluation demonstrates evidence for subsidence of the humeral stem.  Workup including inflammatory parameters negative for obvious evidence for an  infectious etiology.  Bone scan negative for obvious evidence to suggest deep infection.  Due to her increasing pain and functional limitations she is brought to the operating this time for planned revision arthroplasty with conversion to reverse arthroplasty.  Preoperatively, I counseled the patient regarding treatment options and risks versus benefits thereof.  Possible surgical complications were all reviewed including potential for bleeding, infection, neurovascular injury, persistent pain, loss of motion, anesthetic complication, failure of the implant, and possible need for additional surgery. They understand and accept and agrees with our planned procedure.   Procedure in detail:  After undergoing routine preop evaluation the patient received prophylactic antibiotics and interscalene block with Exparel was established in the holding area by the anesthesia department.  Subsequently placed spine on the operating table and underwent the smooth induction of a general endotracheal anesthesia.  Placed at the beachchair position and appropriately padded and protected.  The left shoulder girdle region was sterilely prepped and draped in standard fashion.  Timeout was called.  A deltopectoral approach was made to the left shoulder through an approximately 10 cm incision along the previous incision.  Skin flaps elevated dissection carried deeply and the deltopectoral interval was then developed from proximal to distal with the vein taken laterally.  Significant scarring was carefully dissected through allowing exposure of the subdeltoid space separating the deltoid from the underlying humeral metaphysis and rotator cuff and the conjoined tendon was then mobilized and retracted medially.  The rotator cuff was then split along the rotator interval to the base of the coracoid with subscapularis separated away from the lesser tuberosity region with the previously placed sutures for the subscap repair carefully  removed.  Upon entrance into the joint to large volume of clear synovial fluid was encountered with  no evidence of obvious purulence.  A section of the anterior capsular tissue was obtained and sent for routine culture.  We then dissected circumferentially around the proximal humeral metaphysis dividing adhesions and ultimately were able to expose the humeral head which we then removed and with this harvested fibrinous tissue that sat in interval between the humeral head and the humeral stem.  The humeral stem was noted to be grossly loose.  Soft tissues were removed and the stem was then removed using a slaphammer without difficulty leaving behind all proximal humeral bone.  At this point a trial humeral stem was then placed into the humerus with a metal cap to allow protection of the humeral Kapseal bone and we then exposed the glenoid with appropriate retractors.  I performed a circumferential capsulectomy and removal of capsular tissues and identified the glenoid which showed significant erosion posteroinferiorly and was grossly loose.  Glenoid was then removed and beneath the glenoid implant was noted to be significant cystic change within the humeral vault but this was a contained defect.  The soft tissue within the cystic areas was then removed and a portion sent for culture and sensitivity.  We then curetted and debrided the glenoid of all soft tissues and then used pulsatile lavage to meticulously clean the bone.  At this point we then plan for the placement of the glenoid baseplate and guidepin was then directed into the center of the glenoid which did provide substantial bone for the central screw.  This was measured at a 30 mm screw.  Our ultimate baseplate was then assembled and we then utilized a combination of bone graft with demineralized bone matrix that had some tobramycin powder mixed in and this was then impacted into the central defect of the glenoid and that our baseplate was then inserted  which nicely compressed the bone graft into the defect of the glenoid vault and good bone purchase was achieved.  All of the peripheral locking screws were then placed using standard technique with excellent fixation.  A 36/+4 glenosphere was then impacted onto the baseplate and the central locking screw was placed.  At this point we returned our attention back to the humeral metaphysis where the medullary bone was curetted and then we used hand reaming to punch through the pedestal at the base of the previously placed stem and then reamed up to a size 8.  Additional curetting of the canal was then completed removing all soft tissue.  We then broached up to a size 8 stem at 20 degrees of retroversion achieving excellent purchase and fixation.  A neutral metaphyseal reaming guide was then used to prepare the metaphysis.  A trial implant was then placed which showed good motion good stability and good soft tissue balance all much to our satisfaction.  This point the trials were then removed.  Our final size 8 implant was then assembled the canal was irrigated cleaned and dried and tobramycin powder was then sprayed at the humeral canal and a final implant was then seated with excellent fixation.  Trial reductions were then performed and ultimately felt that a +6 poly gave [place] the best soft tissue balance.  The trial was removed and a final +6 constrained poly was then impacted onto the implant and final reduction was performed which again showed excellent motion stability and soft tissue balance all much to our satisfaction.  The wound was then pulsatile lavage.  Final hemostasis was obtained.  The subscapularis was mobilized and did show  good elasticity so it was repaired back to the eyelets on the collar the implant using the previously placed suture tape sutures.  At this point the balance of the tobramycin powder was then sprayed liberally throughout the deep soft tissue planes.  The deltopectoral interval was  reapproximated with a series of figure-of-eight number Vicryl sutures.  2-0 Monocryl used to close the subcu layer and intracuticular 3-0 Monocryl used to close the skin followed by Dermabond and Aquacel dressing.  The left arm was then placed into a sling and the patient was awakened, extubated, and taken to the recovery room in stable condition.  Marin Shutter MD   Contact # [phone]

## 2022-08-07 NOTE — Anesthesia Postprocedure Evaluation (Signed)
Anesthesia Post Note  Patient: Joan Ray  Procedure(s) Performed: Removal of Left anatomic arthroplasty and conversion to reverse arthroplasty (Left: Shoulder)     Patient location during evaluation: PACU Anesthesia Type: General and Regional Level of consciousness: awake and alert Pain management: pain level controlled Vital Signs Assessment: post-procedure vital signs reviewed and stable Respiratory status: spontaneous breathing, nonlabored ventilation and respiratory function stable Cardiovascular status: blood pressure returned to baseline and stable Postop Assessment: no apparent nausea or vomiting Anesthetic complications: no  No notable events documented.  Last Vitals:  Vitals:   08/07/22 1045 08/07/22 1052  BP: 122/72 (!) 105/53  Pulse: 77 74  Resp: 17 12  Temp: (!) 36.4 C   SpO2: 97% 98%    Last Pain:  Vitals:   08/07/22 1045  TempSrc:   PainSc: 0-No pain                 Marland Reine,W. EDMOND

## 2022-08-07 NOTE — Discharge Instructions (Signed)

## 2022-08-07 NOTE — H&P (Signed)
Joan Ray    Chief Complaint: Failed Left shoulder anatomic arthroplasty HPI: The patient is a 62 y.o. female well-known to our practice after previous bilateral shoulder anatomic arthroplasties.  Left shoulder procedure was performed in 2020, and patient recently presented with progressive increasing left shoulder pain and restricted mobility.  Plain radiographs confirm migration of the implant.  Subsequent workup did not show any compelling evidence to suggest an infectious etiology.  Due to her increasing pain and functional rotations and evidence to suggest migration of the implant, she is brought to the operating this time for planned left shoulder revision with anticipated conversion to reverse shoulder arthroplasty  Past Medical History:  Diagnosis Date   Anemia    h/o of   Last infusion of iron  2008   Anxiety    Arthritis    osteo    Complication of anesthesia    Constipation    Depression    d/t decreased activity   GERD (gastroesophageal reflux disease)    Headache(784.0)    migraines   High triglycerides    Hypertension    MS (multiple sclerosis) ([place])    Neuromuscular disorder ([place])    mutliple sclerosis--dx 10/2008   PONV (postoperative nausea and vomiting)    no problems when Scoplamine is used   Pre-diabetes    patient has denied   Restless leg syndrome    Sleep apnea    mild sleep apnea, no CPAP   Wears glasses    Wears glasses       Past Surgical History:  Procedure Laterality Date   ANTERIOR CERVICAL DECOMP/DISCECTOMY FUSION N/A 12/21/2014   Procedure: ANTERIOR CERVICAL DECOMPRESSION/DISCECTOMY FUSION 2 LEVELS C5-7;  Surgeon: Melina Schools, MD;  Location: [facility];  Service: Orthopedics;  Laterality: N/A;   BLADDER SUSPENSION     occas  blood in urine   CARPAL TUNNEL RELEASE Bilateral    ENDOMETRIAL ABLATION  2010   FOOT ARTHRODESIS Right 07/28/2013   Procedure: ARTHRODESIS RIGHT MIDFOOT OF THE FIRST -  THIRD TARSOMETATARSAL JOINTS ;  Surgeon: Wylene Simmer, MD;  Location: [facility];  Service: Orthopedics;  Laterality: Right;   GASTROCNEMIUS RECESSION Right 07/28/2013   Procedure: RIGHT GASTROCNEMIUS RECESSION;  Surgeon: Wylene Simmer, MD;  Location: [facility];  Service: Orthopedics;  Laterality: Right;   JOINT REPLACEMENT  2012   lt total knee   KNEE ARTHROSCOPY     bilateral  in 2010, has multiple scopes   MEDIAL PARTIAL KNEE REPLACEMENT     right   nerve blocks     SHOULDER ARTHROSCOPY Right    SPINAL FUSION  05/05/2013   stress fracture     right 2-3 toe   TENDON RELEASE Right    TOTAL KNEE ARTHROPLASTY  2008   partial rt    TOTAL SHOULDER ARTHROPLASTY Left 03/24/2019   Procedure: TOTAL SHOULDER ARTHROPLASTY;  Surgeon: Justice Britain, MD;  Location: [facility];  Service: Orthopedics;  Laterality: Left;  164mn    TOTAL SHOULDER ARTHROPLASTY Right 09/13/2020   Procedure: TOTAL SHOULDER ARTHROPLASTY;  Surgeon: SJustice Britain MD;  Location: [facility];  Service: Orthopedics;  Laterality: Right;  1254m    Family History  Problem Relation Age of Onset   Heart disease Mother    Cancer Mother    Hypertension Mother    Depression Mother    Heart disease Other    Hypertension Maternal Grandmother    Diabetes Maternal Grandmother    Kidney disease Maternal Grandmother  Rheum arthritis Maternal Grandmother    Depression Maternal Grandmother    Hypertension Paternal Grandmother    Kidney disease Maternal Grandfather    Depression Maternal Grandfather    Depression Brother     Social History:  reports that she has never smoked. She has never used smokeless tobacco. She reports current alcohol use. She reports that she does not use drugs.  BMI: Estimated body mass index is 35.15 kg/m as calculated from the following:   Height as of this encounter: '5\' 9"'$  (1.753 m).   Weight as of this encounter: 108 kg.  Lab Results  Component Value Date   ALBUMIN 4.4 10/25/2019   Diabetes: Patient does not have  a diagnosis of diabetes. Lab Results  Component Value Date   HGBA1C 5.4 08/01/2022     Smoking Status:   reports that she has never smoked. She has never used smokeless tobacco.     Medications Prior to Admission  Medication Sig Dispense Refill   acetaminophen (TYLENOL) 650 MG CR tablet Take 1,300 mg by mouth every 8 (eight) hours as needed for pain.     CELEBREX 200 MG capsule TAKE ONE CAPSULE BY MOUTH TWICE A DAY 60 capsule 5   cetirizine (ZYRTEC) 10 MG tablet Take 10 mg by mouth daily.     Cholecalciferol (VITAMIN D3) 125 MCG (5000 UT) CAPS Take 5,000 Units by mouth daily.      clonazePAM (KLONOPIN) 0.5 MG tablet One po qAM, one po qPM and two po qHS 120 tablet 5   DULoxetine (CYMBALTA) 60 MG capsule TAKE 2 CAPSULES BY MOUTH DAILY (Patient taking differently: Take 120 mg by mouth 2 (two) times daily.) 180 capsule 0   EPINEPHrine 0.3 mg/0.3 mL IJ SOAJ injection Inject 0.3 mg into the muscle as needed for anaphylaxis.     esomeprazole (NEXIUM) 20 MG capsule Take 20 mg by mouth daily with supper.     fluticasone (FLONASE) 50 MCG/ACT nasal spray Place 2 sprays into both nostrils at bedtime.      hydrochlorothiazide (HYDRODIURIL) 25 MG tablet Take 25 mg by mouth daily.     Melatonin 10 MG CAPS Take 10 mg by mouth at bedtime.      natalizumab (TYSABRI) 300 MG/15ML injection Inject 300 mg into the vein every 6 (six) weeks. Takes for MS     Olopatadine HCl 0.6 % SOLN Place 2 sprays into the nose in the morning.     OVER THE COUNTER MEDICATION Take 4 Scoops by mouth daily. Vitality Calm     OVER THE COUNTER MEDICATION Take 3 capsules by mouth daily. A & C Carbamide     pregabalin (LYRICA) 150 MG capsule TAKE 1 CAPSULE BY MOUTH TWICE A DAY 180 capsule 2   rizatriptan (MAXALT) 10 MG tablet Take 10 mg by mouth every 2 (two) hours as needed for migraine. May repeat in 2 hours if needed     senna-docusate (SENOKOT-S) 8.6-50 MG tablet Take 2 tablets by mouth 2 (two) times daily.     verapamil  (CALAN-SR) 180 MG CR tablet TAKE ONE TABLET BY MOUTH EVERY NIGHT AT BEDTIME 90 tablet 2     Physical Exam: Left shoulder examination is as recently documented at her multiple office visits.  She has a well-healed deltopectoral incision.  She is neurovascular intact in the left upper extremity.  There is no erythema or induration.  She has globally decreased strength with pain on both active and passive motion.  Baseline inflammatory parameters are all  within normal limits with exception of mildly elevated interleukin-6 which is not definitively shown any prognostic impact.  Recent bone scan did not show any obviously abnormal uptake's.  Plain radiographs do show evidence for migration of the humeral stem  Vitals  Temp:  [98.1 F (36.7 C)] 98.1 F (36.7 C) (02/01 0615) Pulse Rate:  [60] 60 (02/01 0615) Resp:  [16] 16 (02/01 0615) BP: (148)/(74) 148/74 (02/01 0615) SpO2:  [99 %] 99 % (02/01 0615) Weight:  [501 kg] 108 kg (02/01 0552)  Assessment/Plan  Impression: Failed Left shoulder anatomic arthroplasty  Plan of Action: Procedure(s): Removal of Left anatomic arthroplasty and conversion to reverse arthroplasty  Rosanna Bickle M Sigifredo Pignato 08/07/2022, 6:25 AM Contact # [phone]

## 2022-08-07 NOTE — Anesthesia Procedure Notes (Signed)
Procedure Name: Intubation Date/Time: 08/07/2022 8:01 AM  Performed by: Jonna Munro, CRNAPre-anesthesia Checklist: Patient identified, Patient being monitored, Timeout performed, Emergency Drugs available and Suction available Patient Re-evaluated:Patient Re-evaluated prior to induction Oxygen Delivery Method: Circle System Utilized Preoxygenation: Pre-oxygenation with 100% oxygen Induction Type: IV induction Ventilation: Mask ventilation without difficulty Laryngoscope Size: Mac and 3 Grade View: Grade II Tube type: Oral Tube size: 7.0 mm Number of attempts: 1 Airway Equipment and Method: stylet Placement Confirmation: ETT inserted through vocal cords under direct vision, positive ETCO2 and breath sounds checked- equal and bilateral Secured at: 21 cm Tube secured with: Tape Dental Injury: Teeth and Oropharynx as per pre-operative assessment

## 2022-08-07 NOTE — Transfer of Care (Signed)
Immediate Anesthesia Transfer of Care Note  Patient: Joan Ray  Procedure(s) Performed: Removal of Left anatomic arthroplasty and conversion to reverse arthroplasty (Left: Shoulder)  Patient Location: PACU  Anesthesia Type:General  Level of Consciousness: awake, alert , oriented, and patient cooperative  Airway & Oxygen Therapy: Patient Spontanous Breathing and Patient connected to face mask oxygen  Post-op Assessment: Report given to RN, Post -op Vital signs reviewed and stable, and Patient moving all extremities X 4  Post vital signs: Reviewed and stable  Last Vitals:  Vitals Value Taken Time  BP 130/79 08/07/22 1016  Temp    Pulse 78 08/07/22 1019  Resp 22 08/07/22 1019  SpO2 100 % 08/07/22 1019  Vitals shown include unvalidated device data.  Last Pain:  Vitals:   08/07/22 0615  TempSrc: Oral  PainSc:       Patients Stated Pain Goal: 6 (67/54/49 2010)  Complications: No notable events documented.

## 2022-08-07 NOTE — Anesthesia Procedure Notes (Signed)
Anesthesia Regional Block: Interscalene brachial plexus block   Pre-Anesthetic Checklist: , timeout performed,  Correct Patient, Correct Site, Correct Laterality,  Correct Procedure, Correct Position, site marked,  Risks and benefits discussed,  Pre-op evaluation,  At surgeon's request and post-op pain management  Laterality: Left  Prep: Maximum Sterile Barrier Precautions used, chloraprep       Needles:  Injection technique: Single-shot  Needle Type: Echogenic Stimulator Needle     Needle Length: 9cm  Needle Gauge: 21     Additional Needles:   Procedures:,,,, ultrasound used (permanent image in chart),,    Narrative:  Start time: 08/07/2022 6:58 AM End time: 08/07/2022 7:08 AM Injection made incrementally with aspirations every 5 mL.  Performed by: Personally  Anesthesiologist: Roderic Palau, MD

## 2022-08-08 ENCOUNTER — Encounter: Payer: Self-pay | Admitting: Neurology

## 2022-08-11 ENCOUNTER — Encounter (HOSPITAL_COMMUNITY): Payer: Self-pay | Admitting: Orthopedic Surgery

## 2022-08-12 ENCOUNTER — Encounter (HOSPITAL_COMMUNITY): Payer: Self-pay | Admitting: Orthopedic Surgery

## 2022-08-19 ENCOUNTER — Other Ambulatory Visit: Payer: Self-pay

## 2022-08-19 ENCOUNTER — Ambulatory Visit: Payer: Medicare Other | Admitting: Internal Medicine

## 2022-08-19 ENCOUNTER — Encounter: Payer: Self-pay | Admitting: Internal Medicine

## 2022-08-19 ENCOUNTER — Telehealth: Payer: Self-pay

## 2022-08-19 VITALS — BP 140/72 | HR 62 | Temp 98.0°F | Wt 242.6 lb

## 2022-08-19 DIAGNOSIS — M00012 Staphylococcal arthritis, left shoulder: Secondary | ICD-10-CM

## 2022-08-19 DIAGNOSIS — Z96612 Presence of left artificial shoulder joint: Secondary | ICD-10-CM

## 2022-08-19 NOTE — Progress Notes (Signed)
RFV: referral for septic arthritis Patient ID: Joan Ray, female   DOB: 09-25-1960, 62 y.o.   MRN: [id number]  HPI The patient is a 62 y.o. female with history of bilateral shoulder arthroplasties however started to have worsening left shoulder pain and limited ROM since her surgery in 2020. Imaging c/w hardware loosening. She underwent removal of hardware and reverse shoulder arthroplasty. OR cultures showed 1 of 3 tissue cx c/w staph epi (oxa S) . She reports Hx of keflex '2000mg'$  dose -- with hives  Still has left side pain that radiates up to neck and down arm.   Sochx: retired Therapist, sports.   Gets tysobri injections every 6 wks Outpatient Encounter Medications as of 08/19/2022  Medication Sig   acetaminophen (TYLENOL) 650 MG CR tablet Take 1,300 mg by mouth every 8 (eight) hours as needed for pain.   CELEBREX 200 MG capsule TAKE ONE CAPSULE BY MOUTH TWICE A DAY   cetirizine (ZYRTEC) 10 MG tablet Take 10 mg by mouth daily.   Cholecalciferol (VITAMIN D3) 125 MCG (5000 UT) CAPS Take 5,000 Units by mouth daily.    clonazePAM (KLONOPIN) 0.5 MG tablet One po qAM, one po qPM and two po qHS   DULoxetine (CYMBALTA) 60 MG capsule TAKE 2 CAPSULES BY MOUTH DAILY (Patient taking differently: Take 120 mg by mouth 2 (two) times daily.)   EPINEPHrine 0.3 mg/0.3 mL IJ SOAJ injection Inject 0.3 mg into the muscle as needed for anaphylaxis.   esomeprazole (NEXIUM) 20 MG capsule Take 20 mg by mouth daily with supper.   fluticasone (FLONASE) 50 MCG/ACT nasal spray Place 2 sprays into both nostrils at bedtime.    hydrochlorothiazide (HYDRODIURIL) 25 MG tablet Take 25 mg by mouth daily.   HYDROmorphone (DILAUDID) 2 MG tablet Take 1 tablet (2 mg total) by mouth every 4 (four) hours as needed.   Melatonin 10 MG CAPS Take 10 mg by mouth at bedtime.    natalizumab (TYSABRI) 300 MG/15ML injection Inject 300 mg into the vein every 6 (six) weeks. Takes for MS   Olopatadine HCl 0.6 % SOLN Place 2 sprays into the nose  in the morning.   ondansetron (ZOFRAN) 4 MG tablet Take 1 tablet (4 mg total) by mouth every 8 (eight) hours as needed for nausea or vomiting.   OVER THE COUNTER MEDICATION Take 4 Scoops by mouth daily. Vitality Calm   OVER THE COUNTER MEDICATION Take 3 capsules by mouth daily. A & C Carbamide   pregabalin (LYRICA) 150 MG capsule TAKE 1 CAPSULE BY MOUTH TWICE A DAY   rizatriptan (MAXALT) 10 MG tablet Take 10 mg by mouth every 2 (two) hours as needed for migraine. May repeat in 2 hours if needed   senna-docusate (SENOKOT-S) 8.6-50 MG tablet Take 2 tablets by mouth 2 (two) times daily.   verapamil (CALAN-SR) 180 MG CR tablet TAKE ONE TABLET BY MOUTH EVERY NIGHT AT BEDTIME   Facility-Administered Encounter Medications as of 08/19/2022  Medication   gadopentetate dimeglumine (MAGNEVIST) injection 20 mL     Patient Active Problem List   Diagnosis Date Noted   History of revision of total shoulder arthroplasty 08/07/2022   Multiple joint pain 04/25/2020   High risk medication use 04/25/2020   Bilateral hand pain 05/24/2019   Carpal tunnel syndrome on both sides 05/24/2019   Status post total shoulder arthroplasty, left 03/24/2019   Vision disturbance 03/19/2018   Body mass index (BMI) of 40.0-44.9 in adult [facility]) 05/16/2015   Morbid obesity ([place])  05/16/2015   Multiple sclerosis ([place]) 05/15/2015   Dysesthesia 05/15/2015   Depression with anxiety 05/15/2015   Other fatigue 05/15/2015   Insomnia 05/15/2015   History of optic neuritis 05/15/2015   Common migraine without intractability 05/15/2015   Gait disturbance 05/15/2015   Restless leg syndrome 05/15/2015   Neck pain 12/21/2014   Class 2 obesity 11/04/2014   Cystocele, midline 08/02/2014   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   Major depressive disorder, single episode, severe without psychotic features ([place]) 04/19/2014   Lipoma of back 01/23/2014   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   Bernhardt's paresthesia 01/05/2013   Thoracic and  lumbosacral neuritis 01/05/2013   Abnormal finding on mammography 09/19/2012   Adult BMI 30+ 06/13/2012   Edema extremities 06/13/2012   Abnormal glucose level 06/13/2012   Acid reflux 06/11/2012   Arthritis, degenerative 06/11/2012   Atypical chest pain 03/30/2012   HLD (hyperlipidemia) 03/30/2012   Optic disc pallor 01/05/2012     Health Maintenance Due  Topic Date Due   Medicare Annual Wellness (AWV)  Never done   HIV Screening  Never done   Hepatitis C Screening  Never done   DTaP/Tdap/Td (1 - Tdap) Never done   PAP SMEAR-Modifier  Never done   COLONOSCOPY (Pts 45-3yr Insurance coverage will need to be confirmed)  Never done   MAMMOGRAM  Never done   Zoster Vaccines- Shingrix (2 of 2) 05/05/2018   COVID-19 Vaccine (2 - Janssen risk series) 05/25/2020   INFLUENZA VACCINE  02/04/2022     Review of Systems Per hpi, otherwise negative Physical Exam   BP (!) 140/72   Pulse 62   Temp 98 F (36.7 C) (Oral)   Wt 242 lb 9.6 oz (110 kg)   LMP 04/27/2009   SpO2 98%   BMI 35.83 kg/m   Physical Exam  Constitutional:  oriented to person, place, and time. appears well-developed and well-nourished. No distress.  HENT: [place]/AT, PERRLA, no scleral icterus Mouth/Throat: Oropharynx is clear and moist. No oropharyngeal exudate.  Cardiovascular: Normal rate, regular rhythm and normal heart sounds. Exam reveals no gallop and no friction rub.  No murmur heard.  Pulmonary/Chest: Effort normal and breath sounds normal. No respiratory distress.  has no wheezes.  Neck = supple, no nuchal rigidity Abdominal: Soft. Bowel sounds are normal.  exhibits no distension. There is no tenderness.  Lymphadenopathy: no cervical adenopathy. No axillary adenopathy Neurological: alert and oriented to person, place, and time.  Skin: Skin is warm and dry. No rash noted. No erythema.  Psychiatric: a normal mood and affect.  behavior is normal.    CBC Lab Results  Component Value Date   WBC 5.3  08/01/2022   RBC 4.54 08/01/2022   HGB 13.3 08/01/2022   HCT 40.3 08/01/2022   PLT 255 08/01/2022   MCV 88.8 08/01/2022   MCH 29.3 08/01/2022   MCHC 33.0 08/01/2022   RDW 14.8 08/01/2022   LYMPHSABS 2.8 06/04/2022   MONOABS 0.6 12/03/2010   EOSABS 0.2 06/04/2022    BMET Lab Results  Component Value Date   NA 142 08/01/2022   K 3.8 08/01/2022   CL 105 08/01/2022   CO2 29 08/01/2022   GLUCOSE 89 08/01/2022   BUN 19 08/01/2022   CREATININE 0.74 08/01/2022   CALCIUM 9.1 08/01/2022   GFRNONAA >60 08/01/2022   GFRAA 108 10/25/2019      Assessment and Plan  Subacute left pji = will try to do daptomycin x 6 wk(due to hx of allergy to cephalosporin  and vanco); will do labs  Will reach out to dr Algie Coffer about tysabri. Injection if can postpone?  March 5th is next date it is due  Going to [place] on march 8th.  Unable to due to medical condition and needs letter ---------------------------------------------- Diagnosis: Left shoulder pji  Culture Result: staph epi  Allergies  Allergen Reactions   Bee Venom Swelling   Hydrocodone-Acetaminophen Itching   Benzoyl Peroxide Rash   Progesterone Other (See Comments) and Rash    increase/profuse sweating-flushing-   Sertraline Hcl Other (See Comments)   Statins Swelling    muscle and joint pain/tightness   Trazodone Other (See Comments)    dizziness   Betadine [Povidone Iodine]     Blisters   Codeine Itching    Rash   Darvocet [Propoxyphene N-Acetaminophen] Nausea And Vomiting   Diazepam     Increases agitation   Dilaudid [Hydromorphone Hcl] Itching   Doxycycline     Facial flushing   Keppra [Levetiracetam]     Shaking, nervousness, palpations and flushing   Lamictal [Lamotrigine]     Causes tongue and mouth to tingle   Lipitor [Atorvastatin]     Severe muscle and joint pain / ms relapse   Lyrica [Pregabalin] Other (See Comments)    Doses over '300mg'$  decrease memory   Neurontin [Gabapentin]     Impaired  cognition, slower motor skills (intolerant)   Other     EDTA causes blisters   Oxycodone Hcl Itching   Pravastatin Other (See Comments)    Severe joint pain MS relapse   Sertraline Other (See Comments)    Increased nervousness   Topamax [Topiramate]     Confusion   Tramadol Other (See Comments)    "Nervous, jittery, felt weird, nausea"   Zonisamide     Back pain, headaches, insomnia, chest pressure   Cephalosporins Rash   Erythromycin Itching and Rash   Keflex [Cephalexin] Rash   Vancomycin Rash    Pt stated, " I can't tolerate Vancomycin I get a red rash."    OPAT Orders Discharge antibiotics to be given via PICC line Discharge antibiotics: Per pharmacy protocol: daptomycin '8mg'$ /kg/day / daptomycin '900mg'$  daily  Duration: 6 wk End Date: march 29th 2024   [place] Per Protocol:  Home health RN for IV administration and teaching; PICC line care and labs.    Labs weekly while on IV antibiotics: _x_ CBC with differential _x_ BMP  _x_ CRP _x_ ESR  __x CK  __ Please pull PIC at completion of IV antibiotics _x_ Please leave PIC in place until doctor has seen patient or been notified  Fax weekly labs to [phone]  Clinic Follow Up Appt: In 5-6 wk  @ [facility] with Dr Charyl Minervini

## 2022-08-19 NOTE — Telephone Encounter (Signed)
Per Dr. Baxter Flattery scheduled picc line for 2/15 at 11 am at [facility]. Will need to go to short stay after visit for first dose.  Secure message sent to [place] team with OPAT order. Informed them of picc line appointment. Will need to know if PA is required for antibiotic.  Will fax orders to short stay once [place] team updates office with PA information. Patient is aware of appointment information. Will call office if she has any questions.  Leatrice Jewels, RMA

## 2022-08-20 ENCOUNTER — Other Ambulatory Visit (HOSPITAL_COMMUNITY): Payer: Self-pay | Admitting: *Deleted

## 2022-08-20 LAB — BASIC METABOLIC PANEL
BUN: 16 mg/dL (ref 7–25)
CO2: 30 mmol/L (ref 20–32)
Calcium: 9.2 mg/dL (ref 8.6–10.4)
Chloride: 103 mmol/L (ref 98–110)
Creat: 0.6 mg/dL (ref 0.50–1.05)
Glucose, Bld: 115 mg/dL — ABNORMAL HIGH (ref 65–99)
Potassium: 3.8 mmol/L (ref 3.5–5.3)
Sodium: 142 mmol/L (ref 135–146)

## 2022-08-20 LAB — CBC WITH DIFFERENTIAL/PLATELET
Absolute Monocytes: 292 cells/uL (ref 200–950)
Basophils Absolute: 32 cells/uL (ref 0–200)
Basophils Relative: 0.6 %
Eosinophils Absolute: 297 cells/uL (ref 15–500)
Eosinophils Relative: 5.6 %
HCT: 36.9 % (ref 35.0–45.0)
Hemoglobin: 12.3 g/dL (ref 11.7–15.5)
Lymphs Abs: 2608 cells/uL (ref 850–3900)
MCH: 29.2 pg (ref 27.0–33.0)
MCHC: 33.3 g/dL (ref 32.0–36.0)
MCV: 87.6 fL (ref 80.0–100.0)
MPV: 10.2 fL (ref 7.5–12.5)
Monocytes Relative: 5.5 %
Neutro Abs: 2072 cells/uL (ref 1500–7800)
Neutrophils Relative %: 39.1 %
Platelets: 316 10*3/uL (ref 140–400)
RBC: 4.21 10*6/uL (ref 3.80–5.10)
RDW: 13.9 % (ref 11.0–15.0)
Total Lymphocyte: 49.2 %
WBC: 5.3 10*3/uL (ref 3.8–10.8)

## 2022-08-20 LAB — CK: Total CK: 21 U/L — ABNORMAL LOW (ref 29–143)

## 2022-08-20 LAB — SEDIMENTATION RATE: Sed Rate: 45 mm/h — ABNORMAL HIGH (ref 0–30)

## 2022-08-20 LAB — C-REACTIVE PROTEIN: CRP: 7.3 mg/L (ref <8.0)

## 2022-08-20 NOTE — Telephone Encounter (Signed)
Spoke with [name] at [facility] [facility] regarding orders. Confirmed no PA is needed and should be set to get first dose tomorrow. Patient does have a $100 copay which needs to be paid upfront.  Informed patient who states that should be okay. Does not have any questions at this time.  Will call office if she has any issues tomorrow while getting picc or first dose. Leatrice Jewels, RMA

## 2022-08-21 ENCOUNTER — Encounter (HOSPITAL_COMMUNITY)
Admission: RE | Admit: 2022-08-21 | Discharge: 2022-08-21 | Disposition: A | Discharge status: Home / Self Care | Payer: Medicare Other | Source: Ambulatory Visit | Attending: Internal Medicine | Admitting: Internal Medicine

## 2022-08-21 ENCOUNTER — Ambulatory Visit (HOSPITAL_COMMUNITY)
Admission: RE | Admit: 2022-08-21 | Discharge: 2022-08-21 | Disposition: A | Discharge status: Home / Self Care | Payer: Medicare Other | Source: Ambulatory Visit | Attending: Internal Medicine | Admitting: Internal Medicine

## 2022-08-21 DIAGNOSIS — Z96612 Presence of left artificial shoulder joint: Secondary | ICD-10-CM | POA: Insufficient documentation

## 2022-08-21 DIAGNOSIS — M00012 Staphylococcal arthritis, left shoulder: Secondary | ICD-10-CM | POA: Diagnosis present

## 2022-08-21 MED ORDER — HEPARIN SOD (PORK) LOCK FLUSH 100 UNIT/ML IV SOLN
INTRAVENOUS | Status: AC
  Filled 2022-08-21: qty 5

## 2022-08-21 MED ORDER — LIDOCAINE HCL 1 % IJ SOLN
INTRAMUSCULAR | Status: AC
  Filled 2022-08-21: qty 20

## 2022-08-21 MED ORDER — SODIUM CHLORIDE 0.9 % IV SOLN
900.0000 mg | Freq: Once | INTRAVENOUS | Status: AC
  Administered 2022-08-21: 900 mg via INTRAVENOUS
  Filled 2022-08-21: qty 18

## 2022-08-21 MED ORDER — HEPARIN SOD (PORK) LOCK FLUSH 100 UNIT/ML IV SOLN
250.0000 [IU] | INTRAVENOUS | Status: AC | PRN
  Administered 2022-08-21: 250 [IU]

## 2022-08-21 MED ORDER — DIPHENHYDRAMINE HCL 50 MG/ML IJ SOLN: 25.0000 mg | Freq: Once | INTRAMUSCULAR | Status: DC | PRN

## 2022-08-21 MED ORDER — LIDOCAINE HCL 1 % IJ SOLN
INTRAMUSCULAR | Status: AC
  Administered 2022-08-21: 10 mL
  Filled 2022-08-21: qty 20

## 2022-08-21 NOTE — Procedures (Signed)
Successful placement of single lumen PICC line to right basilic vein. Length 47cm Tip at lower SVC/RA PICC capped No complications Ready for use.  EBL < 5 mL   Rondey Fallen PA-C 08/21/2022 2:56 PM

## 2022-08-23 LAB — AEROBIC/ANAEROBIC CULTURE W GRAM STAIN (SURGICAL/DEEP WOUND)
Culture: NO GROWTH
Culture: NO GROWTH
Gram Stain: NONE SEEN

## 2022-08-26 ENCOUNTER — Encounter: Payer: Self-pay | Admitting: Internal Medicine

## 2022-08-28 ENCOUNTER — Encounter: Payer: Self-pay | Admitting: Neurology

## 2022-08-28 ENCOUNTER — Other Ambulatory Visit: Payer: Self-pay

## 2022-08-28 MED ORDER — DULOXETINE HCL 60 MG PO CPEP: 120.0000 mg | ORAL_CAPSULE | Freq: Every day | ORAL | 0 refills | Status: DC

## 2022-09-10 ENCOUNTER — Other Ambulatory Visit: Payer: Self-pay

## 2022-09-10 ENCOUNTER — Telehealth: Payer: Self-pay

## 2022-09-10 DIAGNOSIS — G35 Multiple sclerosis: Secondary | ICD-10-CM

## 2022-09-10 NOTE — Telephone Encounter (Signed)
Placed JCV AB Lab in [employer] for Pick-up. Results pending.

## 2022-09-11 LAB — CBC WITH DIFFERENTIAL/PLATELET
Basophils Absolute: 0 10*3/uL (ref 0.0–0.2)
Basos: 1 %
EOS (ABSOLUTE): 0.3 10*3/uL (ref 0.0–0.4)
Eos: 6 %
Hematocrit: 38.1 % (ref 34.0–46.6)
Hemoglobin: 12.7 g/dL (ref 11.1–15.9)
Immature Grans (Abs): 0 10*3/uL (ref 0.0–0.1)
Immature Granulocytes: 0 %
Lymphocytes Absolute: 2.6 10*3/uL (ref 0.7–3.1)
Lymphs: 55 %
MCH: 28.8 pg (ref 26.6–33.0)
MCHC: 33.3 g/dL (ref 31.5–35.7)
MCV: 86 fL (ref 79–97)
Monocytes Absolute: 0.3 10*3/uL (ref 0.1–0.9)
Monocytes: 7 %
Neutrophils Absolute: 1.4 10*3/uL (ref 1.4–7.0)
Neutrophils: 31 %
Platelets: 194 10*3/uL (ref 150–450)
RBC: 4.41 x10E6/uL (ref 3.77–5.28)
RDW: 14.2 % (ref 11.7–15.4)
WBC: 4.7 10*3/uL (ref 3.4–10.8)

## 2022-09-15 NOTE — Telephone Encounter (Signed)
JCV drawn on 09/10/22 positive, index: 0.45. Gave to MD for review.

## 2022-09-22 ENCOUNTER — Ambulatory Visit: Payer: Medicare Other | Admitting: Internal Medicine

## 2022-09-22 ENCOUNTER — Telehealth: Payer: Self-pay

## 2022-09-22 ENCOUNTER — Encounter: Payer: Self-pay | Admitting: Internal Medicine

## 2022-09-22 ENCOUNTER — Other Ambulatory Visit: Payer: Self-pay

## 2022-09-22 VITALS — BP 135/81 | HR 58 | Temp 98.2°F | Wt 239.0 lb

## 2022-09-22 DIAGNOSIS — Z96612 Presence of left artificial shoulder joint: Secondary | ICD-10-CM | POA: Diagnosis not present

## 2022-09-22 DIAGNOSIS — M00012 Staphylococcal arthritis, left shoulder: Secondary | ICD-10-CM | POA: Diagnosis not present

## 2022-09-22 MED ORDER — SULFAMETHOXAZOLE-TRIMETHOPRIM 800-160 MG PO TABS: 1.0000 | ORAL_TABLET | Freq: Two times a day (BID) | ORAL | 3 refills | Status: DC

## 2022-09-22 MED ORDER — FLUCONAZOLE 200 MG PO TABS: 200.0000 mg | ORAL_TABLET | Freq: Every day | ORAL | 0 refills | Status: DC

## 2022-09-22 NOTE — Telephone Encounter (Signed)
Received call from Saralyn Pilar, Pharmacist with [facility] for clarification on Fluconazole. Per pharmacist rx has two different medication directions. Would like to confirm if patient should be taking this once day or one pill every three days.  Spoke with [place], pharmacy resident who was present at visit who was able to confirm medication directions.  Pharmacy understands rx is for one pill every three days. Leatrice Jewels, RMA

## 2022-09-22 NOTE — Progress Notes (Signed)
RFV: follow up for pji Patient ID: Joan Ray, female   DOB: [birth year], 62 y.o.   MRN: [id number]  HPI The patient is a 62 y.o. female with history of bilateral shoulder arthroplasties however started to have worsening left shoulder pain and limited ROM since her surgery in 2020. Imaging c/w hardware loosening. She underwent removal of hardware and reverse shoulder arthroplasty. OR cultures showed 1 of 3 tissue cx c/w staph epi (oxa S) . She reports Hx of keflex 2000mg  dose -- with hives  Starts PT tomorrow;picc line doing well. Tolerating daptomycin; yeast infection.   Past allergy: took doxycycline for years, but then had some facial flushing- but awhile ago.    Outpatient Encounter Medications as of 09/22/2022  Medication Sig   acetaminophen (TYLENOL) 650 MG CR tablet Take 1,300 mg by mouth every 8 (eight) hours as needed for pain.   CELEBREX 200 MG capsule TAKE ONE CAPSULE BY MOUTH TWICE A DAY   cetirizine (ZYRTEC) 10 MG tablet Take 10 mg by mouth daily.   Cholecalciferol (VITAMIN D3) 125 MCG (5000 UT) CAPS Take 5,000 Units by mouth daily.    clonazePAM (KLONOPIN) 0.5 MG tablet One po qAM, one po qPM and two po qHS   DAPTOMYCIN IV    DULoxetine (CYMBALTA) 60 MG capsule Take 2 capsules (120 mg total) by mouth daily.   EPINEPHrine 0.3 mg/0.3 mL IJ SOAJ injection Inject 0.3 mg into the muscle as needed for anaphylaxis.   esomeprazole (NEXIUM) 20 MG capsule Take 20 mg by mouth daily with supper.   fluconazole (DIFLUCAN) 100 MG tablet Take 100 mg by mouth daily.   fluticasone (FLONASE) 50 MCG/ACT nasal spray Place 2 sprays into both nostrils at bedtime.    hydrochlorothiazide (HYDRODIURIL) 25 MG tablet Take 25 mg by mouth daily.   HYDROmorphone (DILAUDID) 2 MG tablet Take 1 tablet (2 mg total) by mouth every 4 (four) hours as needed.   Melatonin 10 MG CAPS Take 10 mg by mouth at bedtime.    natalizumab (TYSABRI) 300 MG/15ML injection Inject 300 mg into the vein every 6 (six)  weeks. Takes for MS   Olopatadine HCl 0.6 % SOLN Place 2 sprays into the nose in the morning.   ondansetron (ZOFRAN) 4 MG tablet Take 1 tablet (4 mg total) by mouth every 8 (eight) hours as needed for nausea or vomiting.   OVER THE COUNTER MEDICATION Take 4 Scoops by mouth daily. Vitality Calm   OVER THE COUNTER MEDICATION Take 3 capsules by mouth daily. A & C Carbamide   pregabalin (LYRICA) 150 MG capsule TAKE 1 CAPSULE BY MOUTH TWICE A DAY   rizatriptan (MAXALT) 10 MG tablet Take 10 mg by mouth every 2 (two) hours as needed for migraine. May repeat in 2 hours if needed   senna-docusate (SENOKOT-S) 8.6-50 MG tablet Take 2 tablets by mouth 2 (two) times daily.   verapamil (CALAN-SR) 180 MG CR tablet TAKE ONE TABLET BY MOUTH EVERY NIGHT AT BEDTIME   Facility-Administered Encounter Medications as of 09/22/2022  Medication   gadopentetate dimeglumine (MAGNEVIST) injection 20 mL     Patient Active Problem List   Diagnosis Date Noted   History of revision of total shoulder arthroplasty 08/07/2022   Multiple joint pain 04/25/2020   High risk medication use 04/25/2020   Bilateral hand pain 05/24/2019   Carpal tunnel syndrome on both sides 05/24/2019   Status post total shoulder arthroplasty, left 03/24/2019   Vision disturbance 03/19/2018   Body mass  index (BMI) of 40.0-44.9 in adult [facility]) 05/16/2015   Morbid obesity ([place]) 05/16/2015   Multiple sclerosis ([place]) 05/15/2015   Dysesthesia 05/15/2015   Depression with anxiety 05/15/2015   Other fatigue 05/15/2015   Insomnia 05/15/2015   History of optic neuritis 05/15/2015   Common migraine without intractability 05/15/2015   Gait disturbance 05/15/2015   Restless leg syndrome 05/15/2015   Neck pain 12/21/2014   Class 2 obesity 11/04/2014   Cystocele, midline 08/02/2014   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   Major depressive disorder, single episode, severe without psychotic features ([place]) 04/19/2014   Lipoma of back 01/23/2014    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   Bernhardt's paresthesia 01/05/2013   Thoracic and lumbosacral neuritis 01/05/2013   Abnormal finding on mammography 09/19/2012   Adult BMI 30+ 06/13/2012   Edema extremities 06/13/2012   Abnormal glucose level 06/13/2012   Acid reflux 06/11/2012   Arthritis, degenerative 06/11/2012   Atypical chest pain 03/30/2012   HLD (hyperlipidemia) 03/30/2012   Optic disc pallor 01/05/2012     Health Maintenance Due  Topic Date Due   Medicare Annual Wellness (AWV)  Never done   HIV Screening  Never done   Hepatitis C Screening  Never done   DTaP/Tdap/Td (1 - Tdap) Never done   PAP SMEAR-Modifier  Never done   COLONOSCOPY (Pts 45-63yrs Insurance coverage will need to be confirmed)  Never done   MAMMOGRAM  Never done   Zoster Vaccines- Shingrix (2 of 2) 05/05/2018   COVID-19 Vaccine (2 - Janssen risk series) 05/25/2020   INFLUENZA VACCINE  02/04/2022     Review of Systems 12 point ros is negative except for what is mentioned above Physical Exam   BP (!) 144/89   Pulse (!) 58   Temp 98.2 F (36.8 C) (Oral)   Wt 239 lb (108.4 kg)   LMP 04/27/2009   SpO2 96%   BMI 35.29 kg/m    Physical Exam  Constitutional:  oriented to person, place, and time. appears well-developed and well-nourished. No distress.  HENT: [place]/AT, PERRLA, no scleral icterus Mouth/Throat: Oropharynx is clear and moist. No oropharyngeal exudate.  Cardiovascular: Normal rate, regular rhythm and normal heart sounds. Exam reveals no gallop and no friction rub.  No murmur heard.  Pulmonary/Chest: Effort normal and breath sounds normal. No respiratory distress.  has no wheezes.  Neck = supple, no nuchal rigidity Abdominal: Soft. Bowel sounds are normal.  exhibits no distension. There is no tenderness.  Lymphadenopathy: no cervical adenopathy. No axillary adenopathy Neurological: alert and oriented to person, place, and time.  Skin: Skin is warm and dry. No rash noted. No erythema.   Psychiatric: a normal mood and affect.  behavior is normal.   CBC Lab Results  Component Value Date   WBC 4.7 09/10/2022   RBC 4.41 09/10/2022   HGB 12.7 09/10/2022   HCT 38.1 09/10/2022   PLT 194 09/10/2022   MCV 86 09/10/2022   MCH 28.8 09/10/2022   MCHC 33.3 09/10/2022   RDW 14.2 09/10/2022   LYMPHSABS 2.6 09/10/2022   MONOABS 0.6 12/03/2010   EOSABS 0.3 09/10/2022    BMET Lab Results  Component Value Date   NA 142 08/19/2022   K 3.8 08/19/2022   CL 103 08/19/2022   CO2 30 08/19/2022   GLUCOSE 115 (H) 08/19/2022   BUN 16 08/19/2022   CREATININE 0.60 08/19/2022   CALCIUM 9.2 08/19/2022   GFRNONAA >60 08/01/2022   GFRAA 108 10/25/2019    Lab Results  Component  Value Date   ESRSEDRATE 23 (H) 08/19/2022     Assessment and Plan Shoulder pji with staph epi = will plan to finish iv abtx thorugh march 29th then convert to orals  Vaginal candidiasis = Fluconazole 200mg  po daily x Q3d Bactrim to start on march 30th

## 2022-09-30 LAB — LAB REPORT - SCANNED: EGFR: 99

## 2022-10-06 ENCOUNTER — Encounter: Payer: Self-pay | Admitting: Internal Medicine

## 2022-10-14 ENCOUNTER — Ambulatory Visit (INDEPENDENT_AMBULATORY_CARE_PROVIDER_SITE_OTHER): Payer: Medicare Other | Admitting: Infectious Disease

## 2022-10-14 ENCOUNTER — Encounter: Payer: Self-pay | Admitting: Infectious Disease

## 2022-10-14 ENCOUNTER — Other Ambulatory Visit: Payer: Self-pay

## 2022-10-14 VITALS — BP 110/75 | HR 60 | Temp 97.3°F | Ht 68.5 in | Wt 247.0 lb

## 2022-10-14 DIAGNOSIS — T8459XD Infection and inflammatory reaction due to other internal joint prosthesis, subsequent encounter: Secondary | ICD-10-CM

## 2022-10-14 DIAGNOSIS — G35D Multiple sclerosis, unspecified: Secondary | ICD-10-CM | POA: Insufficient documentation

## 2022-10-14 DIAGNOSIS — Z96619 Presence of unspecified artificial shoulder joint: Secondary | ICD-10-CM

## 2022-10-14 DIAGNOSIS — Z881 Allergy status to other antibiotic agents status: Secondary | ICD-10-CM

## 2022-10-14 DIAGNOSIS — G35 Multiple sclerosis: Secondary | ICD-10-CM | POA: Insufficient documentation

## 2022-10-14 DIAGNOSIS — T8459XA Infection and inflammatory reaction due to other internal joint prosthesis, initial encounter: Secondary | ICD-10-CM | POA: Insufficient documentation

## 2022-10-14 DIAGNOSIS — Z96612 Presence of left artificial shoulder joint: Secondary | ICD-10-CM

## 2022-10-14 HISTORY — DX: Infection and inflammatory reaction due to other internal joint prosthesis, initial encounter: Z96.619

## 2022-10-14 MED ORDER — AMOXICILLIN-POT CLAVULANATE 875-125 MG PO TABS: 1.0000 | ORAL_TABLET | Freq: Two times a day (BID) | ORAL | 2 refills | Status: DC

## 2022-10-14 NOTE — Progress Notes (Signed)
Subjective:  Chief complaint follow-up for prosthetic shoulder infection   Patient ID: Joan Ray, female    DOB: [birth year], 62 y.o.   MRN: [id number]  HPI  62 y.o. female with history of bilateral shoulder arthroplasties however started to have worsening left shoulder pain and limited ROM since her surgery in 2020. Imaging c/w hardware loosening. She underwent removal of hardware and reverse shoulder arthroplasty. OR cultures showed 1 of 3 tissue cx c/w staph epi (oxa S) . She reports Hx of keflex  dose -- with hives   She was placed on daptomycin which she completed and then placed on Bactrim.  Doxycycline was also not an option because she had facial flushing as well as tongue numbness when she was on doxycycline  When I reviewed her allergies she mentioned that she has been prescribed Augmentin prior to dental procedures by Dr. Rennis Chris.  I was shocked that she is able to tolerate Augmentin with a severe allergy to cephalexin but she has taken Augmentin repeatedly since the apparent allergic reaction to Augmentin.  Interestingly she has had other reactions such as severe rashes with dye allotted that of simply not happened with subsequent doses of Dilaudid.  In any case she has been on Bactrim but in need of labs being checked and we will check a CBC and BMP today  Given that she can tolerate Augmentin I would prefer to use this for a methicillin sensitive staph infection over the Bactrim.    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   Restless leg syndrome    Sleep apnea    mild sleep apnea, no CPAP   Wears glasses    Wears glasses     Past Surgical History:  Procedure Laterality Date   ANTERIOR CERVICAL DECOMP/DISCECTOMY FUSION N/A 12/21/2014   Procedure: ANTERIOR CERVICAL DECOMPRESSION/DISCECTOMY FUSION 2 LEVELS C5-7;  Surgeon: Venita Lick, MD;  Location: [facility];  Service: Orthopedics;  Laterality: N/A;   BLADDER SUSPENSION     occas  blood in urine   CARPAL TUNNEL RELEASE Bilateral    ENDOMETRIAL ABLATION  2010   FOOT ARTHRODESIS Right 07/28/2013   Procedure: ARTHRODESIS RIGHT MIDFOOT OF THE FIRST -  THIRD TARSOMETATARSAL JOINTS ;  Surgeon: Toni Arthurs, MD;  Location: [facility];  Service: Orthopedics;  Laterality: Right;   GASTROCNEMIUS RECESSION Right 07/28/2013   Procedure: RIGHT GASTROCNEMIUS RECESSION;  Surgeon: Toni Arthurs, MD;  Location: [facility];  Service: Orthopedics;  Laterality: Right;   JOINT REPLACEMENT  2012   lt total knee   KNEE ARTHROSCOPY     bilateral  in 2010, has multiple scopes   MEDIAL PARTIAL KNEE REPLACEMENT     right   nerve blocks     SHOULDER ARTHROSCOPY Right    SPINAL FUSION  05/05/2013  stress fracture     right 2-3 toe   TENDON RELEASE Right    TOTAL KNEE ARTHROPLASTY  2008   partial rt    TOTAL SHOULDER ARTHROPLASTY Left 03/24/2019   Procedure: TOTAL SHOULDER ARTHROPLASTY;  Surgeon: Francena HanlySupple, Kevin, MD;  Location: [facility];  Service: Orthopedics;  Laterality: Left;  120min    TOTAL SHOULDER ARTHROPLASTY Right 09/13/2020   Procedure: TOTAL SHOULDER ARTHROPLASTY;  Surgeon: Francena HanlySupple, Kevin, MD;  Location: [facility];  Service: Orthopedics;  Laterality: Right;  120min   TOTAL SHOULDER REVISION Left 08/07/2022   Procedure: Removal of Left anatomic arthroplasty and conversion to reverse arthroplasty;  Surgeon: Francena HanlySupple, Kevin, MD;  Location: [facility];  Service:  Orthopedics;  Laterality: Left;  150min    Family History  Problem Relation Age of Onset   Heart disease Mother    Cancer Mother    Hypertension Mother    Depression Mother    Heart disease Other    Hypertension Maternal Grandmother    Diabetes Maternal Grandmother    Kidney disease Maternal Grandmother    Rheum arthritis Maternal Grandmother    Depression Maternal Grandmother    Hypertension Paternal Grandmother    Kidney disease Maternal Grandfather    Depression Maternal Grandfather    Depression Brother       Social History   Socioeconomic History   Marital status: Married    Spouse name: Not on file   Number of children: 3   Years of education: Not on file   Highest education level: Not on file  Occupational History    Comment: RN  Tobacco Use   Smoking status: Never   Smokeless tobacco: Never  Vaping Use   Vaping Use: Never used  Substance and Sexual Activity   Alcohol use: Yes    Comment: socially--wine & beer   Drug use: No   Sexual activity: Not on file  Other Topics Concern   Not on file  Social History Narrative   Lives at home with   Caffeine use:    Social Determinants of Health   Financial Resource Strain: Not on file  Food Insecurity: Not on file  Transportation Needs: Not on file  Physical Activity: Not on file  Stress: Not on file  Social Connections: Not on file    Allergies  Allergen Reactions   Bee Venom Swelling   Hydrocodone-Acetaminophen Itching   Benzoyl Peroxide Rash   Progesterone Other (See Comments) and Rash    increase/profuse sweating-flushing-   Sertraline Hcl Other (See Comments)   Statins Swelling    muscle and joint pain/tightness   Trazodone Other (See Comments)    dizziness   Betadine [Povidone Iodine]     Blisters   Codeine Itching    Rash   Darvocet [Propoxyphene N-Acetaminophen] Nausea And Vomiting   Diazepam     Increases agitation   Dilaudid [Hydromorphone Hcl] Itching   Doxycycline     Facial  flushing   Keppra [Levetiracetam]     Shaking, nervousness, palpations and flushing   Lamictal [Lamotrigine]     Causes tongue and mouth to tingle   Lipitor [Atorvastatin]     Severe muscle and joint pain / ms relapse   Lyrica [Pregabalin] Other (See Comments)    Doses over 300mg  decrease memory   Neurontin [Gabapentin]     Impaired cognition, slower motor skills (intolerant)   Other     EDTA causes blisters   Oxycodone Hcl Itching   Pravastatin Other (See Comments)  Severe joint pain MS relapse   Sertraline Other (See Comments)    Increased nervousness   Topamax [Topiramate]     Confusion   Tramadol Other (See Comments)    "Nervous, jittery, felt weird, nausea"   Zonisamide     Back pain, headaches, insomnia, chest pressure   Cephalosporins Rash   Erythromycin Itching and Rash   Keflex [Cephalexin] Rash   Vancomycin Rash    Pt stated, " I can't tolerate Vancomycin I get a red rash."     Current Outpatient Medications:    acetaminophen (TYLENOL) 650 MG CR tablet, Take 1,300 mg by mouth every 8 (eight) hours as needed for pain., Disp: , Rfl:    amoxicillin-clavulanate (AUGMENTIN) 875-125 MG tablet, Take 1 tablet by mouth 2 (two) times daily. Pt tolerates augmentin, Disp: 60 tablet, Rfl: 2   CELEBREX 200 MG capsule, TAKE ONE CAPSULE BY MOUTH TWICE A DAY, Disp: 60 capsule, Rfl: 5   cetirizine (ZYRTEC) 10 MG tablet, Take 10 mg by mouth daily., Disp: , Rfl:    Cholecalciferol (VITAMIN D3) 125 MCG (5000 UT) CAPS, Take 5,000 Units by mouth daily. , Disp: , Rfl:    clonazePAM (KLONOPIN) 0.5 MG tablet, One po qAM, one po qPM and two po qHS, Disp: 120 tablet, Rfl: 5   DULoxetine (CYMBALTA) 60 MG capsule, Take 2 capsules (120 mg total) by mouth daily., Disp: 180 capsule, Rfl: 0   EPINEPHrine 0.3 mg/0.3 mL IJ SOAJ injection, Inject 0.3 mg into the muscle as needed for anaphylaxis., Disp: , Rfl:    esomeprazole (NEXIUM) 20 MG capsule, Take 20 mg by mouth daily with supper., Disp: , Rfl:     fluconazole (DIFLUCAN) 200 MG tablet, Take 1 tablet (200 mg total) by mouth daily. 1 every 3rd day, Disp: 10 tablet, Rfl: 0   fluticasone (FLONASE) 50 MCG/ACT nasal spray, Place 2 sprays into both nostrils at bedtime. , Disp: , Rfl:    hydrochlorothiazide (HYDRODIURIL) 25 MG tablet, Take 25 mg by mouth daily., Disp: , Rfl:    HYDROmorphone (DILAUDID) 2 MG tablet, Take 1 tablet (2 mg total) by mouth every 4 (four) hours as needed., Disp: 30 tablet, Rfl: 0   Melatonin 10 MG CAPS, Take 10 mg by mouth at bedtime. , Disp: , Rfl:    natalizumab (TYSABRI) 300 MG/15ML injection, Inject 300 mg into the vein every 6 (six) weeks. Takes for MS, Disp: , Rfl:    Olopatadine HCl 0.6 % SOLN, Place 2 sprays into the nose in the morning., Disp: , Rfl:    OVER THE COUNTER MEDICATION, Take 4 Scoops by mouth daily. Vitality Calm, Disp: , Rfl:    OVER THE COUNTER MEDICATION, Take 3 capsules by mouth daily. A & C Carbamide, Disp: , Rfl:    pregabalin (LYRICA) 150 MG capsule, TAKE 1 CAPSULE BY MOUTH TWICE A DAY, Disp: 180 capsule, Rfl: 2   rizatriptan (MAXALT) 10 MG tablet, Take 10 mg by mouth every 2 (two) hours as needed for migraine. May repeat in 2 hours if needed, Disp: , Rfl:    senna-docusate (SENOKOT-S) 8.6-50 MG tablet, Take 2 tablets by mouth 2 (two) times daily., Disp: , Rfl:    verapamil (CALAN-SR) 180 MG CR tablet, TAKE ONE TABLET BY MOUTH EVERY NIGHT AT BEDTIME, Disp: 90 tablet, Rfl: 2   ondansetron (ZOFRAN) 4 MG tablet, Take 1 tablet (4 mg total) by mouth every 8 (eight) hours as needed for nausea or vomiting., Disp: 10 tablet, Rfl: 0 No current  facility-administered medications for this visit.  Facility-Administered Medications Ordered in Other Visits:    gadopentetate dimeglumine (MAGNEVIST) injection 20 mL, 20 mL, Intravenous, Once PRN, Sater, Pearletha Furl, MD   Review of Systems  Constitutional:  Negative for chills and fever.  HENT:  Negative for congestion and sore throat.   Eyes:  Negative for  photophobia.  Respiratory:  Negative for cough, shortness of breath and wheezing.   Cardiovascular:  Negative for chest pain, palpitations and leg swelling.  Gastrointestinal:  Negative for abdominal pain, blood in stool, constipation, diarrhea, nausea and vomiting.  Genitourinary:  Negative for dysuria, flank pain and hematuria.  Musculoskeletal:  Negative for back pain and myalgias.  Skin:  Negative for rash.  Neurological:  Negative for dizziness, weakness and headaches.  Hematological:  Does not bruise/bleed easily.  Psychiatric/Behavioral:  Negative for suicidal ideas.        Objective:   Physical Exam Constitutional:      General: She is not in acute distress.    Appearance: Normal appearance. She is well-developed. She is not ill-appearing or diaphoretic.  HENT:     Head: Normocephalic and atraumatic.     Right Ear: Hearing and external ear normal.     Left Ear: Hearing and external ear normal.     Nose: No nasal deformity or rhinorrhea.  Eyes:     General: No scleral icterus.    Conjunctiva/sclera: Conjunctivae normal.     Right eye: Right conjunctiva is not injected.     Left eye: Left conjunctiva is not injected.     Pupils: Pupils are equal, round, and reactive to light.  Neck:     Vascular: No JVD.  Cardiovascular:     Rate and Rhythm: Normal rate and regular rhythm.     Heart sounds: S1 normal and S2 normal.  Pulmonary:     Effort: Pulmonary effort is normal. No respiratory distress.     Breath sounds: No wheezing.  Abdominal:     General: Bowel sounds are normal.     Palpations: Abdomen is soft.     Tenderness: There is no abdominal tenderness.  Musculoskeletal:        General: Normal range of motion.     Right shoulder: Normal.     Left shoulder: Normal.     Cervical back: Normal range of motion and neck supple.     Right hip: Normal.     Left hip: Normal.     Right knee: Normal.     Left knee: Normal.  Lymphadenopathy:     Head:     Right side of  head: No submandibular, preauricular or posterior auricular adenopathy.     Left side of head: No submandibular, preauricular or posterior auricular adenopathy.     Cervical: No cervical adenopathy.     Right cervical: No superficial or deep cervical adenopathy.    Left cervical: No superficial or deep cervical adenopathy.  Skin:    General: Skin is warm and dry.     Coloration: Skin is not pale.     Findings: No abrasion, bruising, ecchymosis, erythema, lesion or rash.     Nails: There is no clubbing.  Neurological:     General: No focal deficit present.     Mental Status: She is alert and oriented to person, place, and time.     Sensory: No sensory deficit.     Coordination: Coordination normal.     Gait: Gait normal.  Psychiatric:  Attention and Perception: She is attentive.        Mood and Affect: Mood normal.        Speech: Speech normal.        Behavior: Behavior normal. Behavior is cooperative.        Thought Content: Thought content normal.        Judgment: Judgment normal.           Assessment & Plan:  Prosthetic joint infection:  I am exchanging her from Bactrim over to Augmentin but will check CBC and BMP at be today.  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 Drue Second in person next month to make decision on length of therapy, 3 vs 6 vs year  I spent 42 minutes with the patient including e time in face to face counseling of the patient regarding the above issues along with review of medical records in preparation for the visit and during the visit and in coordination of her care.

## 2022-10-15 ENCOUNTER — Other Ambulatory Visit: Payer: Self-pay | Admitting: Neurology

## 2022-10-15 LAB — CBC WITH DIFFERENTIAL/PLATELET
Absolute Monocytes: 314 cells/uL (ref 200–950)
Basophils Absolute: 50 cells/uL (ref 0–200)
Basophils Relative: 0.9 %
Eosinophils Absolute: 274 cells/uL (ref 15–500)
Eosinophils Relative: 4.9 %
HCT: 37.2 % (ref 35.0–45.0)
Hemoglobin: 12.2 g/dL (ref 11.7–15.5)
Lymphs Abs: 2682 cells/uL (ref 850–3900)
MCH: 28.8 pg (ref 27.0–33.0)
MCHC: 32.8 g/dL (ref 32.0–36.0)
MCV: 87.9 fL (ref 80.0–100.0)
MPV: 10.9 fL (ref 7.5–12.5)
Monocytes Relative: 5.6 %
Neutro Abs: 2279 cells/uL (ref 1500–7800)
Neutrophils Relative %: 40.7 %
Platelets: 262 10*3/uL (ref 140–400)
RBC: 4.23 10*6/uL (ref 3.80–5.10)
RDW: 14.6 % (ref 11.0–15.0)
Total Lymphocyte: 47.9 %
WBC: 5.6 10*3/uL (ref 3.8–10.8)

## 2022-10-15 LAB — BASIC METABOLIC PANEL WITH GFR
BUN/Creatinine Ratio: 21 (calc) (ref 6–22)
BUN: 22 mg/dL (ref 7–25)
CO2: 30 mmol/L (ref 20–32)
Calcium: 9.3 mg/dL (ref 8.6–10.4)
Chloride: 104 mmol/L (ref 98–110)
Creat: 1.06 mg/dL — ABNORMAL HIGH (ref 0.50–1.05)
Glucose, Bld: 91 mg/dL (ref 65–99)
Potassium: 4 mmol/L (ref 3.5–5.3)
Sodium: 141 mmol/L (ref 135–146)
eGFR: 59 mL/min/{1.73_m2} — ABNORMAL LOW (ref >=60)

## 2022-10-15 LAB — SEDIMENTATION RATE: Sed Rate: 11 mm/h (ref 0–30)

## 2022-10-15 LAB — C-REACTIVE PROTEIN: CRP: 3.4 mg/L (ref <8.0)

## 2022-10-17 ENCOUNTER — Other Ambulatory Visit: Payer: Self-pay | Admitting: Neurology

## 2022-10-27 ENCOUNTER — Ambulatory Visit: Payer: Medicare Other | Admitting: Internal Medicine

## 2022-11-10 ENCOUNTER — Encounter: Payer: Self-pay | Admitting: Neurology

## 2022-11-10 ENCOUNTER — Ambulatory Visit: Payer: Medicare Other | Admitting: Neurology

## 2022-11-10 VITALS — BP 115/69 | HR 60 | Ht 68.0 in | Wt 247.0 lb

## 2022-11-10 DIAGNOSIS — Z79899 Other long term (current) drug therapy: Secondary | ICD-10-CM | POA: Diagnosis not present

## 2022-11-10 DIAGNOSIS — R208 Other disturbances of skin sensation: Secondary | ICD-10-CM | POA: Diagnosis not present

## 2022-11-10 DIAGNOSIS — R269 Unspecified abnormalities of gait and mobility: Secondary | ICD-10-CM | POA: Diagnosis not present

## 2022-11-10 DIAGNOSIS — G35 Multiple sclerosis: Secondary | ICD-10-CM | POA: Diagnosis not present

## 2022-11-10 DIAGNOSIS — F418 Other specified anxiety disorders: Secondary | ICD-10-CM

## 2022-11-10 NOTE — Progress Notes (Signed)
[facility]  PATIENT: Joan Ray DOB: Mar 31, 1961  REFERRING DOCTOR OR PCP:  Venita Lick [phone]) Also Gabriel Earing (PCP) SOURCE: Patient, MRI images on PACS and CD and notes from PCP and ortho  _________________________________   HISTORICAL  CHIEF COMPLAINT:  Chief Complaint  Patient presents with   Room 10    Pt is here Alone. Pt states that she had a displaced left shoulder and had a replacement. Pt states that she had a biopsy done and it came back a staph infection in her left shoulder and is on antibiotics.     HISTORY OF PRESENT ILLNESS:  Joan Ray is a 62 y.o. right-handed patient with relapsing remitting MS diagnosed in 2010.     Update 11/10/2022 For MS, she is on Tysabri and tolerates it well.   She had a positive JCV Ab increasing to 0.9 once but 0.45 in 09/2022 (positive) last few ties checked.   We increased the interval to q6 weeks.  The MS is stable but she feels more fatigue with the longer interval.    The only other DMT she has bene on is Avonex.    Her latest MRI 11/2021 showed no new lesions.     She has needed shoulder replacement  and a reverse shoulder replacement on her left that was complicated by infection needing IV Abx x 6 weeks and still on pills.  She has dysesthesias mostly feet are burning.  This intensifies to more fiery with activity and as the day goes on.   Initially she felt the alpha lipoic acid was helping but now is not sure.    Pain is worse with movements.     She is also on Lyrica 150 mg bid and duloxetine.    Lamotrigine had not helped her neuropathic pain and was not tolerated.(Tongue burning) and oxcarbazepine had not helped.          She is not sleeping due to pain she believes.   Her Fitbit reports low deep sleep.   Clonazepam 1 mg helps her sleep.  Clonazepam also helps her spasms.during the day.    Tizanidine had not helped and baclofen did not help much.    Flexeril had helped initially but then  stopped so she discontinued.     Mood is doing ok.  She takes duloxetine 120 mg daily..  HgbA1c ws 6.0 and she is trying to lose weight   Wegovy was not covererd.  Trulicity had not helped. She is seeing a weight loss Center [facility] on [address]) and lost 40 pounds (gained a few back associated with inactivity after shoulder surgery)   She had CTR on the left and had right shoulder replacement March 2022 and left March 202    MS History:   In April 2010, she presented with optic neuritis in the left eye. She also had pain in the left eye. She went to an ophthalmologist who did some tests and referred her or an MRI. The MRI was abnormal, consistent with MS and she was referred to Dr. Donnetta Simpers.   Usually, she was placed on weekly Avonex injections. She did not like the way that she felt on the injections. Additionally, an MRI of the brain showed that she had 2 new lesions. Therefore, she was switched to Tysabri in 2011.     On Tysabri, she has had no definite exacerbations, no change in MRI. Additionally she tolerates the medication very well. She has  had some low positive JCV antibody titers. We do not have the actual numbers.    She switched from Dr. Hale Bogus to Dr. Orpah Clinton in [place] in 2013. She is interested in changing to an MS specialist.    Last MRI was early 2016 at [place] and [address] in [place].     Spine/limb pain history:    She reports having  L5-S1 fusion in October 2014 and C5-C7 fusion June 2016.   Although some symptoms improved after surgery, she continues to have a lot of difficulties with neck pain, arm pain, back pain and leg pain.    She was placed on Lyrica for her pain and was titrated to 200 mg twice a day but could not tolerate that dose. She has since been reduced to 100 mg by mouth twice a day and tolerates that dose but continues to have pain.      She reports some axial neck and lower back pain but the dysesthetic pain in the hands and feet is more  troublesome.    Both hands have the dysesthetic pain but in the legs, the right foot is much worse than the left.   She gets some benefit from Lyrica.   She felt her pain had been better on Cymbalta than on Effexor. However, in 2013 when she was having more neurologic issues the Cymbalta was discontinued.   In the right foot, the pain is most intense on the top of the foot and towards the ankle but not above the ankle.    I personally reviewed the MRI of the cervical spine from 10/07/2014.   It shows degenerative changes at C5-C6 resulting in left foraminal narrowing and changes at C6-C7 resulting right foraminal narrowing. The spinal cord appears normal. Plain films done after surgery showed a C5-C7 fusion and lumbar plain films showed the L5-S1 fusion.  STUDIES: Nerve conduction and EMG study 07/2019 showed bilateral carpal tunnel syndrome, worse on the left and a mild left chronic C7 radiculopathy without any active features.  We discussed referring her to a hand surgeon as she has noted increasing pain over the past couple months.  Additionally, the carpal tunnel syndrome on the left has worsened compared to the 2018 NCV/EMG study  IMAGING MRI brain 11/26/2022 showed  Scattered T2/FLAIR hyperintense foci in the cerebral hemispheres in a pattern consistent with chronic demyelinating plaque associated with multiple sclerosis.  None of the foci appear to be acute.  Compared to the MRI from 2019, there do not appear to be any new lesions.     Pituitary tissue is thinned within a normal sized sella turcica.  This is usually an incidental finding and appears stable compared to the 2020 MRI.     MRI cervical spine 11/26/2022 was unchanged compared to 2020.    MRI Cervical spine 05/13/2019 showed  That the spinal cord appears normal.   ACDF at C5-C7.  This has occurred since the 07/02/2010 MRI. .   Mild degenerative changes at C2-C3, C3-C4 and C4-C5, T1-T2 and T2-T3 that do not lead to significant foraminal  narrowing or spinal stenosis.  At C7-T1, there is moderate right foraminal narrowing but no definite nerve root compression.   Degenerative changes at C7-T1 and T1-T2 have progressed compared to the 2011 MRI.  MRI Brain 04/13/2018 showed multiple T2/FLAIR hyperintense foci in the hemispheres in a pattern and configuration consistent with chronic demyelinating plaque associated with multiple sclerosis.  None of the foci appears to be acute and there do  not appear to be any new lesions compared to 09/24/2017 MRI. There is increased adenoidal tissue in the posterior nasopharynx.  This appears unchanged compared to the previous MRI.  There is a normal enhancement pattern and there are no acute findings.   REVIEW OF SYSTEMS: Constitutional: No fevers, chills, sweats, or change in appetite.    She has insomnia and headaches and restless leg syndrome she reports a lot of fatigue. Eyes: No visual changes, double vision, eye pain Ear, nose and throat: No hearing loss, ear pain, nasal congestion, sore throat Cardiovascular: No chest pain, palpitations Respiratory:  No shortness of breath at rest or with exertion.   No wheezes GastrointestinaI:  She has constipation.   No nausea, vomiting, diarrhea, abdominal pain, fecal incontinence Genitourinary:  No dysuria, urinary retention or frequency.  No nocturia. Musculoskeletal:  as above Integumentary: No rash, skin lesions.   Some prurutis Neurological: as above Psychiatric: Notes depression > anxiety Endocrine: No palpitations, diaphoresis, change in appetite, change in weigh or increased thirst Hematologic/Lymphatic:  No anemia, purpura, petechiae. Allergic/Immunologic: No itchy/runny eyes, nasal congestion, recent allergic reactions, rashes  ALLERGIES: Allergies  Allergen Reactions   Bee Venom Swelling   Hydrocodone-Acetaminophen Itching   Benzoyl Peroxide Rash   Progesterone Other (See Comments) and Rash    increase/profuse sweating-flushing-    Sertraline Hcl Other (See Comments)   Statins Swelling    muscle and joint pain/tightness   Trazodone Other (See Comments)    dizziness   Betadine [Povidone Iodine]     Blisters   Codeine Itching    Rash   Darvocet [Propoxyphene N-Acetaminophen] Nausea And Vomiting   Diazepam     Increases agitation   Dilaudid [Hydromorphone Hcl] Itching   Doxycycline     Facial flushing   Keppra [Levetiracetam]     Shaking, nervousness, palpations and flushing   Lamictal [Lamotrigine]     Causes tongue and mouth to tingle   Lipitor [Atorvastatin]     Severe muscle and joint pain / ms relapse   Lyrica [Pregabalin] Other (See Comments)    Doses over 300mg  decrease memory   Neurontin [Gabapentin]     Impaired cognition, slower motor skills (intolerant)   Other     EDTA causes blisters   Oxycodone Hcl Itching   Pravastatin Other (See Comments)    Severe joint pain MS relapse   Sertraline Other (See Comments)    Increased nervousness   Topamax [Topiramate]     Confusion   Tramadol Other (See Comments)    "Nervous, jittery, felt weird, nausea"   Zonisamide     Back pain, headaches, insomnia, chest pressure   Cephalosporins Rash   Erythromycin Itching and Rash   Keflex [Cephalexin] Rash   Vancomycin Rash    Pt stated, " I can't tolerate Vancomycin I get a red rash."    HOME MEDICATIONS:  Current Outpatient Medications:    acetaminophen (TYLENOL) 650 MG CR tablet, Take 1,300 mg by mouth every 8 (eight) hours as needed for pain., Disp: , Rfl:    amoxicillin-clavulanate (AUGMENTIN) 875-125 MG tablet, Take 1 tablet by mouth 2 (two) times daily. Pt tolerates augmentin, Disp: 60 tablet, Rfl: 2   CELEBREX 200 MG capsule, TAKE ONE CAPSULE BY MOUTH TWICE A DAY, Disp: 60 capsule, Rfl: 5   cetirizine (ZYRTEC) 10 MG tablet, Take 10 mg by mouth daily., Disp: , Rfl:    Cholecalciferol (VITAMIN D3) 125 MCG (5000 UT) CAPS, Take 5,000 Units by mouth daily. , Disp: , Rfl:  clonazePAM (KLONOPIN) 0.5 MG  tablet, TAKE 1 TABLETN BY MOUTH EVERY MORNING , THEN  TAKE 1 TABLET BY MOUTH EVERY EVENING AND THEN TAKE  2 TABLETS BY MOUTH EVERY NIGHT AT BEDTIME, Disp: 120 tablet, Rfl: 5   DULoxetine (CYMBALTA) 60 MG capsule, Take 2 capsules (120 mg total) by mouth daily., Disp: 180 capsule, Rfl: 0   EPINEPHrine 0.3 mg/0.3 mL IJ SOAJ injection, Inject 0.3 mg into the muscle as needed for anaphylaxis., Disp: , Rfl:    esomeprazole (NEXIUM) 20 MG capsule, Take 20 mg by mouth daily with supper., Disp: , Rfl:    fluconazole (DIFLUCAN) 200 MG tablet, Take 1 tablet (200 mg total) by mouth daily. 1 every 3rd day, Disp: 10 tablet, Rfl: 0   fluticasone (FLONASE) 50 MCG/ACT nasal spray, Place 2 sprays into both nostrils at bedtime. , Disp: , Rfl:    hydrochlorothiazide (HYDRODIURIL) 25 MG tablet, Take 25 mg by mouth daily., Disp: , Rfl:    HYDROmorphone (DILAUDID) 2 MG tablet, Take 1 tablet (2 mg total) by mouth every 4 (four) hours as needed., Disp: 30 tablet, Rfl: 0   Melatonin 10 MG CAPS, Take 10 mg by mouth at bedtime. , Disp: , Rfl:    natalizumab (TYSABRI) 300 MG/15ML injection, Inject 300 mg into the vein every 6 (six) weeks. Takes for MS, Disp: , Rfl:    Olopatadine HCl 0.6 % SOLN, Place 2 sprays into the nose in the morning., Disp: , Rfl:    OVER THE COUNTER MEDICATION, Take 3 capsules by mouth daily. A & C Carbamide, Disp: , Rfl:    pregabalin (LYRICA) 150 MG capsule, TAKE 1 CAPSULE BY MOUTH TWICE A DAY, Disp: 180 capsule, Rfl: 2   rizatriptan (MAXALT) 10 MG tablet, Take 10 mg by mouth every 2 (two) hours as needed for migraine. May repeat in 2 hours if needed, Disp: , Rfl:    senna-docusate (SENOKOT-S) 8.6-50 MG tablet, Take 2 tablets by mouth 2 (two) times daily., Disp: , Rfl:    verapamil (CALAN-SR) 180 MG CR tablet, TAKE ONE TABLET BY MOUTH EVERY NIGHT AT BEDTIME, Disp: 90 tablet, Rfl: 2   ondansetron (ZOFRAN) 4 MG tablet, Take 1 tablet (4 mg total) by mouth every 8 (eight) hours as needed for nausea or  vomiting., Disp: 10 tablet, Rfl: 0   OVER THE COUNTER MEDICATION, Take 4 Scoops by mouth daily. Vitality Calm, Disp: , Rfl:  No current facility-administered medications for this visit.  Facility-Administered Medications Ordered in Other Visits:    gadopentetate dimeglumine (MAGNEVIST) injection 20 mL, 20 mL, Intravenous, Once PRN, Temprance Wyre, Pearletha Furl, MD  PAST MEDICAL HISTORY: Past Medical History:  Diagnosis Date   Anemia    h/o of   Last infusion of iron  2008   Anxiety    Arthritis    osteo    Complication of anesthesia    Constipation    Depression    d/t decreased activity   GERD (gastroesophageal reflux disease)    Headache(784.0)    migraines   High triglycerides    Hypertension    MS (multiple sclerosis) (HCC)    Neuromuscular disorder (HCC)    mutliple sclerosis--dx 10/2008   PONV (postoperative nausea and vomiting)    no problems when Scoplamine is used   Pre-diabetes    patient has denied   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   Restless leg syndrome    Sleep apnea    mild sleep apnea, no CPAP   Wears glasses  Wears glasses     PAST SURGICAL HISTORY: Past Surgical History:  Procedure Laterality Date   ANTERIOR CERVICAL DECOMP/DISCECTOMY FUSION N/A 12/21/2014   Procedure: ANTERIOR CERVICAL DECOMPRESSION/DISCECTOMY FUSION 2 LEVELS C5-7;  Surgeon: Venita Lick, MD;  Location: [facility];  Service: Orthopedics;  Laterality: N/A;   BLADDER SUSPENSION     occas  blood in urine   BREAST BIOPSY  2024   CARPAL TUNNEL RELEASE Bilateral    ENDOMETRIAL ABLATION  2010   FOOT ARTHRODESIS Right 07/28/2013   Procedure: ARTHRODESIS RIGHT MIDFOOT OF THE FIRST -  THIRD TARSOMETATARSAL JOINTS ;  Surgeon: Toni Arthurs, MD;  Location: [facility];  Service: Orthopedics;  Laterality: Right;   GASTROCNEMIUS RECESSION Right 07/28/2013   Procedure: RIGHT GASTROCNEMIUS RECESSION;  Surgeon: Toni Arthurs, MD;  Location: [facility];  Service:  Orthopedics;  Laterality: Right;   JOINT REPLACEMENT  2012   lt total knee   KNEE ARTHROSCOPY     bilateral  in 2010, has multiple scopes   MEDIAL PARTIAL KNEE REPLACEMENT     right   nerve blocks     PICC LINE INSERTION  2024   SHOULDER ARTHROSCOPY Right    SPINAL FUSION  05/05/2013   stress fracture     right 2-3 toe   TENDON RELEASE Right    TOTAL KNEE ARTHROPLASTY  2008   partial rt    TOTAL SHOULDER ARTHROPLASTY Left 03/24/2019   Procedure: TOTAL SHOULDER ARTHROPLASTY;  Surgeon: Francena Hanly, MD;  Location: [facility];  Service: Orthopedics;  Laterality: Left;     TOTAL SHOULDER ARTHROPLASTY Right 09/13/2020   Procedure: TOTAL SHOULDER ARTHROPLASTY;  Surgeon: Francena Hanly, MD;  Location: [facility];  Service: Orthopedics;  Laterality: Right;    TOTAL SHOULDER REVISION Left 08/07/2022   Procedure: Removal of Left anatomic arthroplasty and conversion to reverse arthroplasty;  Surgeon: Francena Hanly, MD;  Location: [facility];  Service: Orthopedics;  Laterality: Left;     FAMILY HISTORY: Family History  Problem Relation Age of Onset   Heart disease Mother    Cancer Mother    Hypertension Mother    Depression Mother    Heart disease Other    Hypertension Maternal Grandmother    Diabetes Maternal Grandmother    Kidney disease Maternal Grandmother    Rheum arthritis Maternal Grandmother    Depression Maternal Grandmother    Hypertension Paternal Grandmother    Kidney disease Maternal Grandfather    Depression Maternal Grandfather    Depression Brother     SOCIAL HISTORY:  Social History   Socioeconomic History   Marital status: Married    Spouse name: Not on file   Number of children: 3   Years of education: Not on file   Highest education level: Not on file  Occupational History    Comment: RN  Tobacco Use   Smoking status: Never   Smokeless tobacco: Never  Vaping Use   Vaping Use: Never used  Substance and Sexual Activity   Alcohol use: Yes     Comment: socially--wine & beer   Drug use: No   Sexual activity: Not on file  Other Topics Concern   Not on file  Social History Narrative   Lives at home with   Caffeine use:    Social Determinants of Health   Financial Resource Strain: Not on file  Food Insecurity: Not on file  Transportation Needs: Not on file  Physical Activity: Not on file  Stress: Not  on file  Social Connections: Not on file  Intimate Partner Violence: Not on file        DIAGNOSTIC DATA (LABS, IMAGING, TESTING) - I reviewed patient records, labs, notes, testing and imaging myself where available.  Lab Results  Component Value Date   WBC 5.6 10/14/2022   HGB 12.2 10/14/2022   HCT 37.2 10/14/2022   MCV 87.9 10/14/2022   PLT 262 10/14/2022      Component Value Date/Time   NA 141 10/14/2022 0243   NA 143 10/25/2019 1343   K 4.0 10/14/2022 0243   CL 104 10/14/2022 0243   CO2 30 10/14/2022 0243   GLUCOSE 91 10/14/2022 0243   BUN 22 10/14/2022 0243   BUN 16 10/25/2019 1343   CREATININE 1.06 (H) 10/14/2022 0243   CALCIUM 9.3 10/14/2022 0243   PROT 6.5 10/25/2019 1343   ALBUMIN 4.4 10/25/2019 1343   AST 21 10/25/2019 1343   ALT 30 10/25/2019 1343   ALKPHOS 92 10/25/2019 1343   BILITOT 0.4 10/25/2019 1343   GFRNONAA >60 08/01/2022 1350   GFRAA 108 10/25/2019 1343     PHYSICAL EXAM   General: The patient is well-developed and well-nourished and in no acute distress.  The neck is mildly tender with good ROM.  She has reduced range of motion at shoulders left worse than right.No rashes or edema.     Neurologic Exam   Mental status: The patient is alert and oriented x 3 at the time of the examination.    Motor:  Muscle bulk is normal.   Tone is normal. Strength is  5 / 5 in all 4 extremities except 4+/5 bilateral APB muscles   Sensory: There is a Tinel's sign at both wrists..  She has slight allodynia over the thenar eminence on the left.  Vibration sensation was reduced in the feet.   Touch was normal.    Coordination: Cerebellar testing reveals good finger-nose-finger and heel-to-shin bilaterally.   Gait and station: Station is normal.   Her gait is arthritic and mildly wide.  The tandem gait is wide..  Romberg is negative.    Reflexes: Deep tendon reflexes are symmetric and normal bilaterally.        ASSESSMENT AND PLAN  Multiple sclerosis (HCC)  Gait disturbance  High risk medication use  Dysesthesia  Depression with anxiety   1.   Continue Tysabri.  She has been low titer JCV antibody positive and we will continue Tysabri every 6 weeks.  Check JCV antibody every 6 months.  We also discussed switching to [facility] as risk of PML is lower. 2.  She will continue medications for her dysesthetic pain, myalgias, depression.    3.  Stay active and exercise as tolerated. 4.   Return in 6 months or sooner if there are new or worsening neurologic symptoms.   This visit is part of a comprehensive longitudinal care medical relationship regarding the patients primary diagnosis of multiple sclerosis and related concerns.  Khalani Novoa A. Epimenio Foot, MD, PhD 11/10/2022, 11:34 AM Certified in Neurology, Clinical Neurophysiology, Sleep Medicine, Pain Medicine and Neuroimaging  [facility] [address] [place], [place] [address] [phone]

## 2022-11-12 ENCOUNTER — Encounter: Payer: Self-pay | Admitting: Neurology

## 2022-11-13 ENCOUNTER — Other Ambulatory Visit: Payer: Self-pay

## 2022-11-13 ENCOUNTER — Telehealth (INDEPENDENT_AMBULATORY_CARE_PROVIDER_SITE_OTHER): Payer: Medicare Other | Admitting: Internal Medicine

## 2022-11-13 DIAGNOSIS — T8459XD Infection and inflammatory reaction due to other internal joint prosthesis, subsequent encounter: Secondary | ICD-10-CM

## 2022-11-13 DIAGNOSIS — Z96612 Presence of left artificial shoulder joint: Secondary | ICD-10-CM

## 2022-11-13 NOTE — Progress Notes (Signed)
Virtual Visit via Video Note  I connected with Joan Ray on 11/13/22 at  2:15 PM EDT by a video enabled telemedicine application and verified that I am speaking with the correct person using two identifiers.  Location: Patient: at home Provider: in clinic   I discussed the limitations of evaluation and management by telemedicine and the availability of in person appointments. The patient expressed understanding and agreed to proceed.  History of Present Illness: The patient is a 62 y.o. female with history of bilateral shoulder arthroplasties however started to have worsening left shoulder pain and limited ROM since her surgery in 2020. Imaging c/w hardware loosening. She underwent removal of hardware and reverse shoulder arthroplasty on 08/07/22. She was placed on 6 wk of daptomycin through march 29th, since OR cultures showed 1 of 3 tissue cx c/w staph epi (oxa S). She was then converted to oral amox/clav since end of march to finish out the course. We are at the 3 month mark. [facility] IS the allergy)  Most recently had episode of having coughing and severe migraine like headache. Post nasal drip she has noticing. Has had tylenol/maxalt with some mild relief. Had breast biopsy on Tuesday but staying home.   Staphylococcus epidermidis      MIC    CIPROFLOXACIN <=0.5 SENSI... Sensitive    CLINDAMYCIN >=8 RESISTANT Resistant    ERYTHROMYCIN >=8 RESISTANT Resistant    GENTAMICIN <=0.5 SENSI... Sensitive    Inducible Clindamycin NEGATIVE Sensitive    OXACILLIN <=0.25 SENS... Sensitive    RIFAMPIN <=0.5 SENSI... Sensitive    TETRACYCLINE <=1 SENSITIVE Sensitive    TRIMETH/SULFA <=10 SENSIT... 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 We will see back in 6 wk and do labs at that time.  Viral respiratory infection = continue with supportive care  Follow Up Instructions: See back in 6 wk   I discussed the assessment and treatment plan with the patient. The patient was provided an opportunity to ask questions and all were answered. The patient agreed with the plan and demonstrated an understanding of the instructions.   The patient was advised to call back or seek an in-person evaluation if the symptoms worsen or if the condition fails to improve as anticipated.  I provided 42 minutes of face-to-face and non face to face time during this encounter.   Judyann Munson, MD

## 2022-11-24 ENCOUNTER — Other Ambulatory Visit: Payer: Self-pay | Admitting: Neurology

## 2022-11-24 NOTE — Telephone Encounter (Signed)
Pt last seen on 11/10/22 per note " She takes duloxetine 120 mg daily.. "  Follow up scheduled 05/18/23  Last filled on 08/28/22 #90 tablets (90 day supply)

## 2022-12-11 ENCOUNTER — Telehealth: Payer: Self-pay | Admitting: *Deleted

## 2022-12-11 ENCOUNTER — Other Ambulatory Visit: Payer: Self-pay | Admitting: *Deleted

## 2022-12-11 ENCOUNTER — Other Ambulatory Visit: Payer: Self-pay

## 2022-12-11 DIAGNOSIS — Z79899 Other long term (current) drug therapy: Secondary | ICD-10-CM

## 2022-12-11 DIAGNOSIS — G35 Multiple sclerosis: Secondary | ICD-10-CM

## 2022-12-11 NOTE — Telephone Encounter (Signed)
Placed JCV lab in [facility] lock box for routine lab pick up. Results pending. 

## 2022-12-12 LAB — CBC WITH DIFFERENTIAL/PLATELET
Basophils Absolute: 0 10*3/uL (ref 0.0–0.2)
Basos: 0 %
EOS (ABSOLUTE): 0.2 10*3/uL (ref 0.0–0.4)
Eos: 3 %
Hematocrit: 36.3 % (ref 34.0–46.6)
Hemoglobin: 11.9 g/dL (ref 11.1–15.9)
Immature Grans (Abs): 0 10*3/uL (ref 0.0–0.1)
Immature Granulocytes: 1 %
Lymphocytes Absolute: 3.3 10*3/uL — ABNORMAL HIGH (ref 0.7–3.1)
Lymphs: 55 %
MCH: 29.4 pg (ref 26.6–33.0)
MCHC: 32.8 g/dL (ref 31.5–35.7)
MCV: 90 fL (ref 79–97)
Monocytes Absolute: 0.4 10*3/uL (ref 0.1–0.9)
Monocytes: 6 %
NRBC: 1 % — ABNORMAL HIGH (ref 0–0)
Neutrophils Absolute: 2.1 10*3/uL (ref 1.4–7.0)
Neutrophils: 35 %
Platelets: 186 10*3/uL (ref 150–450)
RBC: 4.05 x10E6/uL (ref 3.77–5.28)
RDW: 14.6 % (ref 11.7–15.4)
WBC: 5.9 10*3/uL (ref 3.4–10.8)

## 2022-12-24 NOTE — Telephone Encounter (Signed)
JCV ab drawn on 12/11/22 indeterminate, index: 0.30. Inhibition assay: positive. Gave to MD for review.

## 2023-02-06 ENCOUNTER — Other Ambulatory Visit (HOSPITAL_COMMUNITY): Payer: Self-pay | Admitting: Orthopedic Surgery

## 2023-02-06 DIAGNOSIS — Z96612 Presence of left artificial shoulder joint: Secondary | ICD-10-CM

## 2023-02-18 ENCOUNTER — Encounter (HOSPITAL_COMMUNITY)
Admission: RE | Admit: 2023-02-18 | Discharge: 2023-02-18 | Disposition: A | Discharge status: Home / Self Care | Payer: Medicare Other | Source: Ambulatory Visit | Attending: Orthopedic Surgery | Admitting: Orthopedic Surgery

## 2023-02-18 DIAGNOSIS — Z96612 Presence of left artificial shoulder joint: Secondary | ICD-10-CM | POA: Diagnosis present

## 2023-02-18 MED ORDER — TECHNETIUM TC 99M MEDRONATE IV KIT
20.7000 | PACK | Freq: Once | INTRAVENOUS | Status: AC | PRN
  Administered 2023-02-18: 20.7 via INTRAVENOUS

## 2023-03-11 ENCOUNTER — Telehealth: Payer: Self-pay | Admitting: Neurology

## 2023-03-11 ENCOUNTER — Telehealth: Payer: Self-pay | Admitting: *Deleted

## 2023-03-11 ENCOUNTER — Other Ambulatory Visit: Payer: Self-pay

## 2023-03-11 ENCOUNTER — Other Ambulatory Visit: Payer: Self-pay | Admitting: *Deleted

## 2023-03-11 DIAGNOSIS — G35 Multiple sclerosis: Secondary | ICD-10-CM

## 2023-03-11 DIAGNOSIS — Z79899 Other long term (current) drug therapy: Secondary | ICD-10-CM

## 2023-03-11 DIAGNOSIS — G35D Multiple sclerosis, unspecified: Secondary | ICD-10-CM

## 2023-03-11 NOTE — Telephone Encounter (Signed)
JCV lab to [facility].

## 2023-03-11 NOTE — Telephone Encounter (Signed)
LVM and sent mychart msg informing pt of need to reschedule 05/12/23 appt - office closing for election day

## 2023-03-12 LAB — CBC WITH DIFFERENTIAL/PLATELET
Basophils Absolute: 0.1 10*3/uL (ref 0.0–0.2)
Basos: 1 %
EOS (ABSOLUTE): 0.3 10*3/uL (ref 0.0–0.4)
Eos: 5 %
Hematocrit: 39.3 % (ref 34.0–46.6)
Hemoglobin: 12.7 g/dL (ref 11.1–15.9)
Immature Grans (Abs): 0 10*3/uL (ref 0.0–0.1)
Immature Granulocytes: 1 %
Lymphocytes Absolute: 2.4 10*3/uL (ref 0.7–3.1)
Lymphs: 43 %
MCH: 29.5 pg (ref 26.6–33.0)
MCHC: 32.3 g/dL (ref 31.5–35.7)
MCV: 91 fL (ref 79–97)
Monocytes Absolute: 0.3 10*3/uL (ref 0.1–0.9)
Monocytes: 5 %
NRBC: 1 % — ABNORMAL HIGH (ref 0–0)
Neutrophils Absolute: 2.5 10*3/uL (ref 1.4–7.0)
Neutrophils: 45 %
Platelets: 235 10*3/uL (ref 150–450)
RBC: 4.31 x10E6/uL (ref 3.77–5.28)
RDW: 14.4 % (ref 11.7–15.4)
WBC: 5.6 10*3/uL (ref 3.4–10.8)

## 2023-04-15 ENCOUNTER — Other Ambulatory Visit: Payer: Self-pay | Admitting: Neurology

## 2023-04-15 NOTE — Telephone Encounter (Signed)
Last seen on 11/10/22 Follow up scheduled on 05/13/23 Lyrica last filled on 01/14/23 #180 tablets (90 day supply) Rx pending to be signed

## 2023-04-24 ENCOUNTER — Other Ambulatory Visit: Payer: Self-pay | Admitting: Neurology

## 2023-04-27 NOTE — Telephone Encounter (Signed)
Last seen on 11/10/22 Follow up scheduled 05/13/23 Last filled on 03/14/23 # 120 tablets (30 day supply) Rx pending to be signed

## 2023-05-12 ENCOUNTER — Ambulatory Visit: Payer: Medicare Other | Admitting: Neurology

## 2023-05-13 ENCOUNTER — Telehealth: Payer: Self-pay | Admitting: *Deleted

## 2023-05-13 ENCOUNTER — Encounter: Payer: Self-pay | Admitting: Neurology

## 2023-05-13 ENCOUNTER — Ambulatory Visit (INDEPENDENT_AMBULATORY_CARE_PROVIDER_SITE_OTHER): Payer: Medicare Other | Admitting: Neurology

## 2023-05-13 VITALS — BP 137/84 | HR 65 | Ht 68.0 in | Wt 253.0 lb

## 2023-05-13 DIAGNOSIS — F418 Other specified anxiety disorders: Secondary | ICD-10-CM

## 2023-05-13 DIAGNOSIS — R269 Unspecified abnormalities of gait and mobility: Secondary | ICD-10-CM

## 2023-05-13 DIAGNOSIS — G35 Multiple sclerosis: Secondary | ICD-10-CM

## 2023-05-13 DIAGNOSIS — R208 Other disturbances of skin sensation: Secondary | ICD-10-CM

## 2023-05-13 DIAGNOSIS — M2550 Pain in unspecified joint: Secondary | ICD-10-CM

## 2023-05-13 DIAGNOSIS — G4700 Insomnia, unspecified: Secondary | ICD-10-CM

## 2023-05-13 DIAGNOSIS — Z79899 Other long term (current) drug therapy: Secondary | ICD-10-CM | POA: Diagnosis not present

## 2023-05-13 NOTE — Telephone Encounter (Signed)
Faxed referral for Tysabri infusions to [facility] at [phone]. Received fax confirmation.  Sent pt mychart message with update.

## 2023-05-13 NOTE — Telephone Encounter (Signed)
Placed JCV lab in [facility] lock box for routine lab pick up. Results pending. 

## 2023-05-13 NOTE — Progress Notes (Signed)
[facility]  PATIENT: Joan Ray DOB: 05/28/1961  REFERRING DOCTOR OR PCP:  Venita Lick [phone]) Gabriel Earing (PCP) SOURCE: Patient, MRI images on PACS and CD and notes from PCP and ortho  _________________________________   HISTORICAL  CHIEF COMPLAINT:  Chief Complaint  Patient presents with   Follow-up    [name] alone  Pt is well and stable, no MS new concerns     HISTORY OF PRESENT ILLNESS:  Joan Ray is a 62 y.o. right-handed patient with relapsing remitting MS diagnosed in 2010.     Update 05/13/2023 For MS, she is on Tysabri and tolerates it well.   She had a positive JCV Ab increasing to 0.9 once but 0.45 in 09/2022  and 0.3 12/2022.   We increased the interval to q6 weeks.  The MS is stable but she feels more fatigue with the longer interval.    The only other DMT she has bene on is Avonex.    Her latest MRI 11/2021 showed no new lesions.    She has had some insurance issues for the infusions and we could consider a different infusion center  She had a left shoulder replacement in February 2024.   A breast cyst was seen and she needed a biopsy.  It was positive so she had lymph nodes removed -- they showed no cancer.   She had Radiation and a pill was recommended x 5 years but she opted not to take due to side effect profile.     She is experiencing left knee.DJD and is seeing Dr. Charlann Boxer ([facility]).    Injections had not helped.  She also had a steroid taper.    She feels the burning  dysesthesias in her feet are worse.  Pain is worse with movements.     She is also on Lyrica 150 mg bid and duloxetine.    Lamotrigine had not helped her neuropathic pain and was not tolerated.(Tongue burning) and oxcarbazepine had not helped.      Alpha lipoic acid.     She is not sleeping due to pain she believes.   Clonazepam 1 mg helps her sleep.  She sometimes take on duirng the day for her spasms.    Tizanidine had not helped and baclofen did not help  much.    Mood is doing ok.  She takes duloxetine 120 mg daily..  HgbA1c ws 6.0 and she is trying to lose weight   Wegovy was not covererd.  Trulicity had not helped. She is seeing a weight loss Center [facility] on [address]) and lost 40 pounds initially but has regained some   She had CTR on the left and had right shoulder replacement March 2022 and left March 202    MS History:   In April 2010, she presented with optic neuritis in the left eye. She also had pain in the left eye. She went to an ophthalmologist who did some tests and referred her or an MRI. The MRI was abnormal, consistent with MS and she was referred to Dr. Donnetta Simpers.   Usually, she was placed on weekly Avonex injections. She did not like the way that she felt on the injections. Additionally, an MRI of the brain showed that she had 2 new lesions. Therefore, she was switched to Tysabri in 2011.     On Tysabri, she has had no definite exacerbations, no change in MRI. Additionally she tolerates the medication very well. She  has had some low positive JCV antibody titers. We do not have the actual numbers.    She switched from Dr. Hale Bogus to Dr. Orpah Clinton in [place] in 2013. She is interested in changing to an MS specialist.    Last MRI was early 2016 at [facility] [address] in [place].     Spine/limb pain history:    She reports having  L5-S1 fusion in October 2014 and C5-C7 fusion June 2016.   Although some symptoms improved after surgery, she continues to have a lot of difficulties with neck pain, arm pain, back pain and leg pain.    She was placed on Lyrica for her pain and was titrated to 200 mg twice a day but could not tolerate that dose. She has since been reduced to 100 mg by mouth twice a day and tolerates that dose but continues to have pain.      She reports some axial neck and lower back pain but the dysesthetic pain in the hands and feet is more troublesome.    Both hands have the dysesthetic pain but in the  legs, the right foot is much worse than the left.   She gets some benefit from Lyrica.   She felt her pain had been better on Cymbalta than on Effexor. However, in 2013 when she was having more neurologic issues the Cymbalta was discontinued.   In the right foot, the pain is most intense on the top of the foot and towards the ankle but not above the ankle.    I personally reviewed the MRI of the cervical spine from 10/07/2014.   It shows degenerative changes at C5-C6 resulting in left foraminal narrowing and changes at C6-C7 resulting right foraminal narrowing. The spinal cord appears normal. Plain films done after surgery showed a C5-C7 fusion and lumbar plain films showed the L5-S1 fusion.  STUDIES: Nerve conduction and EMG study 07/2019 showed bilateral carpal tunnel syndrome, worse on the left and a mild left chronic C7 radiculopathy without any active features.  We discussed referring her to a hand surgeon as she has noted increasing pain over the past couple months.  Additionally, the carpal tunnel syndrome on the left has worsened compared to the 2018 NCV/EMG study  IMAGING MRI brain 11/26/2022 showed  Scattered T2/FLAIR hyperintense foci in the cerebral hemispheres in a pattern consistent with chronic demyelinating plaque associated with multiple sclerosis.  None of the foci appear to be acute.  Compared to the MRI from 2019, there do not appear to be any new lesions.     Pituitary tissue is thinned within a normal sized sella turcica.  This is usually an incidental finding and appears stable compared to the 2020 MRI.     MRI cervical spine 11/26/2022 was unchanged compared to 2020.    MRI Cervical spine 05/13/2019 showed  That the spinal cord appears normal.   ACDF at C5-C7.  This has occurred since the 07/02/2010 MRI. .   Mild degenerative changes at C2-C3, C3-C4 and C4-C5, T1-T2 and T2-T3 that do not lead to significant foraminal narrowing or spinal stenosis.  At C7-T1, there is moderate right  foraminal narrowing but no definite nerve root compression.   Degenerative changes at C7-T1 and T1-T2 have progressed compared to the 2011 MRI.  MRI Brain 04/13/2018 showed multiple T2/FLAIR hyperintense foci in the hemispheres in a pattern and configuration consistent with chronic demyelinating plaque associated with multiple sclerosis.  None of the foci appears to be acute and there  do not appear to be any new lesions compared to 09/24/2017 MRI. There is increased adenoidal tissue in the posterior nasopharynx.  This appears unchanged compared to the previous MRI.  There is a normal enhancement pattern and there are no acute findings.   REVIEW OF SYSTEMS: Constitutional: No fevers, chills, sweats, or change in appetite.    She has insomnia and headaches and restless leg syndrome she reports a lot of fatigue. Eyes: No visual changes, double vision, eye pain Ear, nose and throat: No hearing loss, ear pain, nasal congestion, sore throat Cardiovascular: No chest pain, palpitations Respiratory:  No shortness of breath at rest or with exertion.   No wheezes GastrointestinaI:  She has constipation.   No nausea, vomiting, diarrhea, abdominal pain, fecal incontinence Genitourinary:  No dysuria, urinary retention or frequency.  No nocturia. Musculoskeletal:  as above Integumentary: No rash, skin lesions.   Some prurutis Neurological: as above Psychiatric: Notes depression > anxiety Endocrine: No palpitations, diaphoresis, change in appetite, change in weigh or increased thirst Hematologic/Lymphatic:  No anemia, purpura, petechiae. Allergic/Immunologic: No itchy/runny eyes, nasal congestion, recent allergic reactions, rashes  ALLERGIES: Allergies  Allergen Reactions   Bee Venom Swelling   Hydrocodone-Acetaminophen Itching   Benzoyl Peroxide Rash   Progesterone Other (See Comments) and Rash    increase/profuse sweating-flushing-   Sertraline Hcl Other (See Comments)   Statins Swelling    muscle  and joint pain/tightness   Trazodone Other (See Comments)    dizziness   Betadine [Povidone Iodine]     Blisters   Codeine Itching    Rash   Darvocet [Propoxyphene N-Acetaminophen] Nausea And Vomiting   Diazepam     Increases agitation   Dilaudid [Hydromorphone Hcl] Itching   Doxycycline     Facial flushing   Keppra [Levetiracetam]     Shaking, nervousness, palpations and flushing   Lamictal [Lamotrigine]     Causes tongue and mouth to tingle   Lipitor [Atorvastatin]     Severe muscle and joint pain / ms relapse   Lyrica [Pregabalin] Other (See Comments)    Doses over 300mg  decrease memory   Neurontin [Gabapentin]     Impaired cognition, slower motor skills (intolerant)   Other     EDTA causes blisters   Oxycodone Hcl Itching   Pravastatin Other (See Comments)    Severe joint pain MS relapse   Sertraline Other (See Comments)    Increased nervousness   Topamax [Topiramate]     Confusion   Tramadol Other (See Comments)    "Nervous, jittery, felt weird, nausea"   Zonisamide     Back pain, headaches, insomnia, chest pressure   Cephalosporins Rash   Erythromycin Itching and Rash   Keflex [Cephalexin] Rash   Vancomycin Rash    Pt stated, " I can't tolerate Vancomycin I get a red rash."    HOME MEDICATIONS:  Current Outpatient Medications:    acetaminophen (TYLENOL) 650 MG CR tablet, Take 1,300 mg by mouth every 8 (eight) hours as needed for pain., Disp: , Rfl:    amoxicillin-clavulanate (AUGMENTIN) 875-125 MG tablet, Take 1 tablet by mouth 2 (two) times daily. Pt tolerates augmentin, Disp: 60 tablet, Rfl: 2   CELEBREX 200 MG capsule, TAKE ONE CAPSULE BY MOUTH TWICE A DAY, Disp: 60 capsule, Rfl: 5   cetirizine (ZYRTEC) 10 MG tablet, Take 10 mg by mouth daily., Disp: , Rfl:    Cholecalciferol (VITAMIN D3) 125 MCG (5000 UT) CAPS, Take 5,000 Units by mouth daily. , Disp: ,  Rfl:    clonazePAM (KLONOPIN) 0.5 MG tablet, TAKE 1 TABLET BY MOUTH EVERY MORNING, 1 TABLET BY MOUTH  EVERY EVENING, AND 2 EVERY NIGHT AT BEDTIME, Disp: 120 tablet, Rfl: 5   DULoxetine (CYMBALTA) 60 MG capsule, TAKE 2 CAPSULES BY MOUTH DAILY, Disp: 180 capsule, Rfl: 1   EPINEPHrine 0.3 mg/0.3 mL IJ SOAJ injection, Inject 0.3 mg into the muscle as needed for anaphylaxis., Disp: , Rfl:    esomeprazole (NEXIUM) 20 MG capsule, Take 20 mg by mouth daily with supper., Disp: , Rfl:    fluticasone (FLONASE) 50 MCG/ACT nasal spray, Place 2 sprays into both nostrils at bedtime. , Disp: , Rfl:    hydrochlorothiazide (HYDRODIURIL) 25 MG tablet, Take 25 mg by mouth daily., Disp: , Rfl:    HYDROmorphone (DILAUDID) 2 MG tablet, Take 1 tablet (2 mg total) by mouth every 4 (four) hours as needed., Disp: 30 tablet, Rfl: 0   Melatonin 10 MG CAPS, Take 10 mg by mouth at bedtime. , Disp: , Rfl:    natalizumab (TYSABRI) 300 MG/15ML injection, Inject 300 mg into the vein every 6 (six) weeks. Takes for MS, Disp: , Rfl:    Olopatadine HCl 0.6 % SOLN, Place 2 sprays into the nose in the morning., Disp: , Rfl:    pregabalin (LYRICA) 150 MG capsule, TAKE ONE CAPSULE BY MOUTH TWICE A DAY, Disp: 180 capsule, Rfl: 1   rizatriptan (MAXALT) 10 MG tablet, Take 10 mg by mouth every 2 (two) hours as needed for migraine. May repeat in 2 hours if needed, Disp: , Rfl:    senna-docusate (SENOKOT-S) 8.6-50 MG tablet, Take 2 tablets by mouth 2 (two) times daily., Disp: , Rfl:    verapamil (CALAN-SR) 180 MG CR tablet, TAKE 1 TABLET BY MOUTH EVERY NIGHT AT BEDTIME, Disp: 90 tablet, Rfl: 1 No current facility-administered medications for this visit.  Facility-Administered Medications Ordered in Other Visits:    gadopentetate dimeglumine (MAGNEVIST) injection 20 mL, 20 mL, Intravenous, Once PRN, Nicasio Barlowe, Pearletha Furl, MD  PAST MEDICAL HISTORY: Past Medical History:  Diagnosis Date   Anemia    h/o of   Last infusion of iron  2008   Anxiety    Arthritis    osteo    Complication of anesthesia    Constipation    Depression    d/t decreased  activity   GERD (gastroesophageal reflux disease)    Headache(784.0)    migraines   High triglycerides    Hypertension    MS (multiple sclerosis) (HCC)    Neuromuscular disorder (HCC)    mutliple sclerosis--dx 10/2008   PONV (postoperative nausea and vomiting)    no problems when Scoplamine is used   Pre-diabetes    patient has denied   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   Restless leg syndrome    Sleep apnea    mild sleep apnea, no CPAP   Wears glasses    Wears glasses     PAST SURGICAL HISTORY: Past Surgical History:  Procedure Laterality Date   ANTERIOR CERVICAL DECOMP/DISCECTOMY FUSION N/A 12/21/2014   Procedure: ANTERIOR CERVICAL DECOMPRESSION/DISCECTOMY FUSION 2 LEVELS C5-7;  Surgeon: Venita Lick, MD;  Location: [facility];  Service: Orthopedics;  Laterality: N/A;   BLADDER SUSPENSION     occas  blood in urine   BREAST BIOPSY  2024   CARPAL TUNNEL RELEASE Bilateral    ENDOMETRIAL ABLATION  2010   FOOT ARTHRODESIS Right 07/28/2013   Procedure: ARTHRODESIS RIGHT MIDFOOT OF THE FIRST -  THIRD  TARSOMETATARSAL JOINTS ;  Surgeon: Toni Arthurs, MD;  Location: [facility];  Service: Orthopedics;  Laterality: Right;   GASTROCNEMIUS RECESSION Right 07/28/2013   Procedure: RIGHT GASTROCNEMIUS RECESSION;  Surgeon: Toni Arthurs, MD;  Location: [facility];  Service: Orthopedics;  Laterality: Right;   JOINT REPLACEMENT  2012   lt total knee   KNEE ARTHROSCOPY     bilateral  in 2010, has multiple scopes   MEDIAL PARTIAL KNEE REPLACEMENT     right   nerve blocks     PICC LINE INSERTION  2024   SHOULDER ARTHROSCOPY Right    SPINAL FUSION  05/05/2013   stress fracture     right 2-3 toe   TENDON RELEASE Right    TOTAL KNEE ARTHROPLASTY  2008   partial rt    TOTAL SHOULDER ARTHROPLASTY Left 03/24/2019   Procedure: TOTAL SHOULDER ARTHROPLASTY;  Surgeon: Francena Hanly, MD;  Location: [facility];  Service: Orthopedics;  Laterality: Left;      TOTAL SHOULDER ARTHROPLASTY Right 09/13/2020   Procedure: TOTAL SHOULDER ARTHROPLASTY;  Surgeon: Francena Hanly, MD;  Location: [facility];  Service: Orthopedics;  Laterality: Right;    TOTAL SHOULDER REVISION Left 08/07/2022   Procedure: Removal of Left anatomic arthroplasty and conversion to reverse arthroplasty;  Surgeon: Francena Hanly, MD;  Location: [facility];  Service: Orthopedics;  Laterality: Left;     FAMILY HISTORY: Family History  Problem Relation Age of Onset   Heart disease Mother    Cancer Mother    Hypertension Mother    Depression Mother    Heart disease Other    Hypertension Maternal Grandmother    Diabetes Maternal Grandmother    Kidney disease Maternal Grandmother    Rheum arthritis Maternal Grandmother    Depression Maternal Grandmother    Hypertension Paternal Grandmother    Kidney disease Maternal Grandfather    Depression Maternal Grandfather    Depression Brother     SOCIAL HISTORY:  Social History   Socioeconomic History   Marital status: Married    Spouse name: Not on file   Number of children: 3   Years of education: Not on file   Highest education level: Not on file  Occupational History    Comment: RN  Tobacco Use   Smoking status: Never   Smokeless tobacco: Never  Vaping Use   Vaping status: Never Used  Substance and Sexual Activity   Alcohol use: Yes    Comment: socially--wine & beer   Drug use: No   Sexual activity: Not on file  Other Topics Concern   Not on file  Social History Narrative   Lives at home with   Caffeine use:    Social Determinants of Health   Financial Resource Strain: Low Risk  (02/14/2023)   Received from [facility]   Overall Financial Resource Strain (CARDIA)    Difficulty of Paying Living Expenses: Not hard at all  Food Insecurity: No Food Insecurity (02/14/2023)   Received from [facility]   Hunger Vital Sign    Worried About Running Out of Food in the Last Year: Never true    Ran Out of Food  in the Last Year: Never true  Transportation Needs: No Transportation Needs (02/14/2023)   Received from [facility] - Transportation    Lack of Transportation (Medical): No    Lack of Transportation (Non-Medical): No  Physical Activity: Unknown (02/14/2023)   Received from [facility]   Exercise  Vital Sign    Days of Exercise per Week: 0 days    Minutes of Exercise per Session: Not on file  Stress: No Stress Concern Present (02/14/2023)   Received from [facility] - Occupational Stress Questionnaire    Feeling of Stress : Only a little  Social Connections: Socially Integrated (02/14/2023)   Received from [facility]   Social Network    How would you rate your social network (family, work, friends)?: Good participation with social networks  Intimate Partner Violence: Not At Risk (02/14/2023)   Received from [facility]   HITS    Over the last 12 months how often did your partner physically hurt you?: 1    Over the last 12 months how often did your partner insult you or talk down to you?: 1    Over the last 12 months how often did your partner threaten you with physical harm?: 1    Over the last 12 months how often did your partner scream or curse at you?: 1        DIAGNOSTIC DATA (LABS, IMAGING, TESTING) - I reviewed patient records, labs, notes, testing and imaging myself where available.  Lab Results  Component Value Date   WBC 5.6 03/11/2023   HGB 12.7 03/11/2023   HCT 39.3 03/11/2023   MCV 91 03/11/2023   PLT 235 03/11/2023      Component Value Date/Time   NA 141 10/14/2022 0243   NA 143 10/25/2019 1343   K 4.0 10/14/2022 0243   CL 104 10/14/2022 0243   CO2 30 10/14/2022 0243   GLUCOSE 91 10/14/2022 0243   BUN 22 10/14/2022 0243   BUN 16 10/25/2019 1343   CREATININE 1.06 (H) 10/14/2022 0243   CALCIUM 9.3 10/14/2022 0243   PROT 6.5 10/25/2019 1343   ALBUMIN 4.4 10/25/2019 1343   AST 21 10/25/2019  1343   ALT 30 10/25/2019 1343   ALKPHOS 92 10/25/2019 1343   BILITOT 0.4 10/25/2019 1343   GFRNONAA >60 08/01/2022 1350   GFRAA 108 10/25/2019 1343     PHYSICAL EXAM   General: The patient is well-developed and well-nourished and in no acute distress.  The neck is mildly tender with good ROM.  She has reduced range of motion at shoulders left worse than right.No rashes or edema.    Left knee is tender.  She has surgical scar.    Neurologic Exam   Mental status: The patient is alert and oriented x 3 at the time of the examination.    Motor:  Muscle bulk is normal.   Tone is normal. Strength is  5 / 5 in all 4 extremities except 4+/5 bilateral APB muscles   Sensory: There is a Tinel's sign at both wrists..  She has slight allodynia over the thenar eminence on the left.  Vibration sensation was reduced in the feet,   Touch was normal.    Coordination: Cerebellar testing reveals good finger-nose-finger and heel-to-shin bilaterally.   Gait and station: Station is normal.   Her gait is arthritic and mildly wide.  Her gait is wide.  The Romberg is negative.   Reflexes: Deep tendon reflexes are symmetric and normal bilaterally.        ASSESSMENT AND PLAN  Multiple sclerosis (HCC)  High risk medication use  Gait disturbance  Multiple joint pain  Depression with anxiety  Dysesthesia  Insomnia, unspecified type   1.   For her MS, we will  continue Tysabri.  May need to change sites to reduce her copay.  She has been low titer JCV antibody positive and we will continue Tysabri every 6 weeks.  Check JCV antibody at least every 6 months.  She asked about other options  Due to breast cancer, I would not recommend an anti-CD20 or cladribine.  Would consider teriflunomide 2.  She will continue medications for her dysesthetic pain, myalgias, depression.    3.  Stay active and exercise as tolerated. 4.   Return in 6 months or sooner if there are new or worsening neurologic symptoms.    This visit is part of a comprehensive longitudinal care medical relationship regarding the patients primary diagnosis of multiple sclerosis and related concerns.  Meshia Rau A. Epimenio Foot, MD, PhD 05/13/2023, 10:59 AM Certified in Neurology, Clinical Neurophysiology, Sleep Medicine, Pain Medicine and Neuroimaging  [facility] [address], [address] [place], [place] [address] [phone]

## 2023-05-14 LAB — CBC WITH DIFFERENTIAL/PLATELET
Basophils Absolute: 0 10*3/uL (ref 0.0–0.2)
Basos: 0 %
EOS (ABSOLUTE): 0.2 10*3/uL (ref 0.0–0.4)
Eos: 4 %
Hematocrit: 42.7 % (ref 34.0–46.6)
Hemoglobin: 13.5 g/dL (ref 11.1–15.9)
Immature Grans (Abs): 0 10*3/uL (ref 0.0–0.1)
Immature Granulocytes: 0 %
Lymphocytes Absolute: 1.7 10*3/uL (ref 0.7–3.1)
Lymphs: 33 %
MCH: 28.7 pg (ref 26.6–33.0)
MCHC: 31.6 g/dL (ref 31.5–35.7)
MCV: 91 fL (ref 79–97)
Monocytes Absolute: 0.4 10*3/uL (ref 0.1–0.9)
Monocytes: 7 %
NRBC: 1 % — ABNORMAL HIGH (ref 0–0)
Neutrophils Absolute: 2.8 10*3/uL (ref 1.4–7.0)
Neutrophils: 56 %
Platelets: 238 10*3/uL (ref 150–450)
RBC: 4.71 x10E6/uL (ref 3.77–5.28)
RDW: 14.4 % (ref 11.7–15.4)
WBC: 5.1 10*3/uL (ref 3.4–10.8)

## 2023-05-18 ENCOUNTER — Ambulatory Visit: Payer: Medicare Other | Admitting: Family Medicine

## 2023-05-19 ENCOUNTER — Other Ambulatory Visit: Payer: Self-pay | Admitting: Neurology

## 2023-05-19 ENCOUNTER — Encounter: Payer: Self-pay | Admitting: Neurology

## 2023-05-19 NOTE — Telephone Encounter (Signed)
Last seen on 05/13/23 Follow up scheduled on 12/01/23

## 2023-05-20 NOTE — Telephone Encounter (Signed)
JCV Antibody 0.31 H Indeterminate Final Result Negative

## 2023-05-25 NOTE — Telephone Encounter (Signed)
Called [facility] at [phone]. Spoke w/ Jasmine. Their system is down currently. Unable to check on referral. Asked we call back this afternoon.

## 2023-05-25 NOTE — Telephone Encounter (Signed)
[employer] and spoke with [place]. They are still working on Engineer, manufacturing systems. They have a follow up call scheduled for this week to the insurance.

## 2023-06-01 NOTE — Telephone Encounter (Signed)
I called [facility] to get an update spoke with Harrold Donath and was told they are still in process with insurance verification.

## 2023-06-01 NOTE — Telephone Encounter (Addendum)
[employer] and spoke with [employer]. She states that notes show her insurance is OON with [facility] and cost would be the same as [facility].   I called pt. She will do infusion 06/09/23 with [facility]. Notified Holly.  [facility] in network. Aware we will refer for future infusions to [facility] at [facility]. [phone] , Fax: [phone].  [address] [place],  [place]  [address]

## 2023-06-02 NOTE — Telephone Encounter (Signed)
Faxed referral to [facility] site below. Received fax confirmation.

## 2023-06-03 ENCOUNTER — Other Ambulatory Visit: Payer: Self-pay

## 2023-06-11 ENCOUNTER — Telehealth: Payer: Self-pay | Admitting: Pharmacy Technician

## 2023-06-11 NOTE — Telephone Encounter (Addendum)
Auth Submission: APPROVED Site of care: Site of care: [facility] Payer: BCBS MEDICARE Medication & CPT/J Code(s) submitted Tysabri Route of submission (phone, fax, portal):  Phone # Fax # Auth type: Buy/Bill PB Units/visits requested: 300MG  Q6WKS Reference number: [id number] Approval from: 06/11/23 to 06/10/24     Auth will not end provided patient remains enrolled with BCBS and there is no change with current dose/drug.

## 2023-07-17 ENCOUNTER — Other Ambulatory Visit: Payer: Self-pay | Admitting: Neurology

## 2023-07-17 NOTE — Telephone Encounter (Signed)
 Requested Prescriptions   Pending Prescriptions Disp Refills   pregabalin  (LYRICA ) 150 MG capsule [Pharmacy Med Name: PREGABALIN  150 MG CAPSULE] 180 capsule     Sig: TAKE 1 CAPSULE BY MOUTH 2 TIMES A DAY   verapamil  (CALAN -SR) 180 MG CR tablet [Pharmacy Med Name: VERAPAMIL  ER 180 MG TABLET] 90 tablet 1    Sig: TAKE 1 TABLET BY MOUTH EVERY NIGHT AT BEDTIME   Last seen 05/13/23, next appt 12/01/23 Dispenses    Dispensed Days Supply Quantity Provider Pharmacy  PREGABALIN  150 MG CAPS 04/15/2023 90 180 applicator Sater, Charlie LABOR, MD [facility]...  PREGABALIN  150 MG CAPS 01/14/2023 90 180 applicator Sater, Charlie LABOR, MD [facility]...  PREGABALIN  150 MG CAPS 10/15/2022 90 180 applicator Sater, Charlie LABOR, MD [facility]...       Dispenses The verapamil  shoul dbe refused   Dispensed Days Supply Quantity Provider Pharmacy  VERAPAMIL  HCL ER 180 MG TBCR 07/12/2023 90 90 tablet Sater, Charlie LABOR, MD [facility]...  VERAPAMIL  HCL ER 180 MG TBCR 04/15/2023 90 90 tablet Sater, Charlie LABOR, MD [facility]...  VERAPAMIL  HCL ER 180 MG TBCR 01/25/2023 90 90 tablet Sater, Charlie LABOR, MD [facility]...  VERAPAMIL  HCL ER 180 MG TBCR 10/09/2022 90 90 tablet Sater, Charlie LABOR, MD [facility]...

## 2023-07-21 ENCOUNTER — Ambulatory Visit: Payer: Medicare Other

## 2023-07-23 ENCOUNTER — Telehealth: Payer: Self-pay | Admitting: Neurology

## 2023-07-23 ENCOUNTER — Ambulatory Visit (INDEPENDENT_AMBULATORY_CARE_PROVIDER_SITE_OTHER): Payer: Medicare Other

## 2023-07-23 ENCOUNTER — Other Ambulatory Visit: Payer: Self-pay

## 2023-07-23 ENCOUNTER — Other Ambulatory Visit (INDEPENDENT_AMBULATORY_CARE_PROVIDER_SITE_OTHER): Payer: Medicare Other

## 2023-07-23 VITALS — BP 114/78 | HR 60 | Temp 98.4°F | Resp 16 | Ht 68.0 in | Wt 264.6 lb

## 2023-07-23 DIAGNOSIS — G35 Multiple sclerosis: Secondary | ICD-10-CM

## 2023-07-23 LAB — CBC WITH DIFFERENTIAL/PLATELET
Basophils Absolute: 0 10*3/uL (ref 0.0–0.1)
Basophils Relative: 0.7 % (ref 0.0–3.0)
Eosinophils Absolute: 0.2 10*3/uL (ref 0.0–0.7)
Eosinophils Relative: 4.9 % (ref 0.0–5.0)
HCT: 38.4 % (ref 36.0–46.0)
Hemoglobin: 12.9 g/dL (ref 12.0–15.0)
Lymphocytes Relative: 38.2 % (ref 12.0–46.0)
Lymphs Abs: 1.6 10*3/uL (ref 0.7–4.0)
MCHC: 33.6 g/dL (ref 30.0–36.0)
MCV: 88.9 fL (ref 78.0–100.0)
Monocytes Absolute: 0.3 10*3/uL (ref 0.1–1.0)
Monocytes Relative: 7.8 % (ref 3.0–12.0)
Neutro Abs: 2 10*3/uL (ref 1.4–7.7)
Neutrophils Relative %: 48.4 % (ref 43.0–77.0)
Platelets: 223 10*3/uL (ref 150.0–400.0)
RBC: 4.32 Mil/uL (ref 3.87–5.11)
RDW: 15.2 % (ref 11.5–15.5)
WBC: 4.2 10*3/uL (ref 4.0–10.5)

## 2023-07-23 MED ORDER — SODIUM CHLORIDE 0.9 % IV SOLN
300.0000 mg | Freq: Once | INTRAVENOUS | Status: AC
  Administered 2023-07-23: 300 mg via INTRAVENOUS
  Filled 2023-07-23: qty 15

## 2023-07-23 MED ORDER — SODIUM CHLORIDE 0.9 % IV SOLN
INTRAVENOUS | Status: DC
Start: 2023-07-23 — End: 2023-07-23

## 2023-07-23 NOTE — Telephone Encounter (Signed)
Called Joan Ray from [facility] and let her know that per Dr. Epimenio Foot he would like for pt to have JCV and CBC done before infusion.  Placed orders for JCV and CBC

## 2023-07-23 NOTE — Progress Notes (Signed)
Diagnosis: Multiple Sclerosis  Provider:  Chilton Greathouse MD  Procedure: IV Infusion  IV Type: Peripheral, IV Location: R Antecubital  Tysabri (Natalizumab), Dose: 300 mg  Infusion Start Time: 1201  Infusion Stop Time: 1305  Post Infusion IV Care: Patient declined observation and Peripheral IV Discontinued  Discharge: Condition: Good, Destination: Home . AVS Declined  Performed by:  Nat Math, RN

## 2023-07-23 NOTE — Telephone Encounter (Signed)
[facility] Denny Peon) Clarification on lab orders before starting infusion. Pt is at our office now to get her infusion. Want to know if we need to do her labs before her infusion today.

## 2023-07-31 LAB — STRATIFY JCV AB (W/ INDEX) W/ RFLX
Index Value: 0.3 {index} — ABNORMAL HIGH
Stratify JCV (TM) Ab w/Reflex Inhibition: UNDETERMINED — AB

## 2023-07-31 LAB — RFLX STRATIFY JCV (TM) AB INHIBITION: JCV Antibody by Inhibition: NEGATIVE

## 2023-07-31 LAB — EXTRA SPECIMEN

## 2023-08-18 ENCOUNTER — Other Ambulatory Visit: Payer: Self-pay

## 2023-09-03 ENCOUNTER — Ambulatory Visit (INDEPENDENT_AMBULATORY_CARE_PROVIDER_SITE_OTHER): Payer: Medicare Other

## 2023-09-03 VITALS — BP 109/77 | HR 62 | Temp 98.0°F | Resp 18 | Ht 68.0 in | Wt 268.8 lb

## 2023-09-03 DIAGNOSIS — G35 Multiple sclerosis: Secondary | ICD-10-CM | POA: Diagnosis not present

## 2023-09-03 MED ORDER — SODIUM CHLORIDE 0.9 % IV SOLN
300.0000 mg | Freq: Once | INTRAVENOUS | Status: AC
  Administered 2023-09-03: 300 mg via INTRAVENOUS
  Filled 2023-09-03: qty 15

## 2023-09-03 MED ORDER — SODIUM CHLORIDE 0.9 % IV SOLN: INTRAVENOUS | Status: DC

## 2023-09-03 NOTE — Progress Notes (Signed)
 Diagnosis: Multiple Sclerosis  Provider:  Chilton Greathouse MD  Procedure: IV Infusion  IV Type: Peripheral, IV Location: R Forearm  Tysabri (Natalizumab), Dose: 300 mg  Infusion Start Time: 1033  Infusion Stop Time: 1133  Post Infusion IV Care: Patient declined observation and Peripheral IV Discontinued  Discharge: Condition: Good, Destination: Home . AVS Declined  Performed by:  Adriana Mccallum, RN

## 2023-10-15 ENCOUNTER — Ambulatory Visit (INDEPENDENT_AMBULATORY_CARE_PROVIDER_SITE_OTHER): Payer: Medicare Other

## 2023-10-15 VITALS — BP 136/85 | HR 57 | Temp 97.9°F | Resp 16 | Ht 68.0 in | Wt 269.8 lb

## 2023-10-15 DIAGNOSIS — G35 Multiple sclerosis: Secondary | ICD-10-CM | POA: Diagnosis not present

## 2023-10-15 MED ORDER — SODIUM CHLORIDE 0.9 % IV SOLN: INTRAVENOUS | Status: DC

## 2023-10-15 MED ORDER — NATALIZUMAB 300 MG/15ML IV CONC
300.0000 mg | Freq: Once | INTRAVENOUS | Status: AC
  Administered 2023-10-15: 300 mg via INTRAVENOUS
  Filled 2023-10-15: qty 15

## 2023-10-15 NOTE — Progress Notes (Signed)
 Diagnosis: Acute Anemia  Provider:  Chilton Greathouse MD  Procedure: IV Infusion  IV Type: Peripheral, IV Location: R Hand  Tysabri (Natalizumab), Dose: 300 mg  Infusion Start Time: 1126  Infusion Stop Time: 1228  Post Infusion IV Care: Patient declined observation and Peripheral IV Discontinued  Discharge: Condition: Good, Destination: Home . AVS Declined  Performed by:  Nat Math, RN

## 2023-10-29 ENCOUNTER — Other Ambulatory Visit: Payer: Self-pay | Admitting: Neurology

## 2023-11-02 NOTE — Telephone Encounter (Signed)
 Last seen on 05/13/23 Follow up scheduled on 12/01/23   Dispensed Days Supply Quantity Provider Pharmacy  CLONAZEPAM  0.5 MG TABS 09/24/2023 30 120 tablet Sater, Sherida Dimmer, MD [facility].Aaron Aas   Rx pending to signed

## 2023-11-12 ENCOUNTER — Ambulatory Visit

## 2023-11-26 ENCOUNTER — Ambulatory Visit (INDEPENDENT_AMBULATORY_CARE_PROVIDER_SITE_OTHER)

## 2023-11-26 VITALS — BP 132/79 | HR 56 | Temp 97.8°F | Resp 16 | Ht 68.0 in | Wt 268.0 lb

## 2023-11-26 DIAGNOSIS — G35 Multiple sclerosis: Secondary | ICD-10-CM

## 2023-11-26 MED ORDER — NATALIZUMAB 300 MG/15ML IV CONC
300.0000 mg | Freq: Once | INTRAVENOUS | Status: AC
  Administered 2023-11-26: 300 mg via INTRAVENOUS
  Filled 2023-11-26: qty 15

## 2023-11-26 MED ORDER — SODIUM CHLORIDE 0.9 % IV SOLN: INTRAVENOUS | Status: DC

## 2023-11-26 NOTE — Progress Notes (Signed)
 Diagnosis: Multiple Sclerosis  Provider:  Mannam, Praveen MD  Procedure: IV Infusion  IV Type: Peripheral, IV Location: L wrist  Tysabri  (Natalizumab ), Dose: 300 mg  Infusion Start Time: 1154  Infusion Stop Time: 1258  Post Infusion IV Care: Patient declined observation  Discharge: Condition: Good, Destination: Home . AVS Declined  Performed by:  Rachelle Bue, RN

## 2023-12-01 ENCOUNTER — Ambulatory Visit (INDEPENDENT_AMBULATORY_CARE_PROVIDER_SITE_OTHER): Payer: Medicare Other | Admitting: Neurology

## 2023-12-01 ENCOUNTER — Telehealth: Payer: Self-pay

## 2023-12-01 ENCOUNTER — Encounter: Payer: Self-pay | Admitting: Neurology

## 2023-12-01 VITALS — BP 139/75 | HR 64 | Ht 68.0 in | Wt 263.5 lb

## 2023-12-01 DIAGNOSIS — G4700 Insomnia, unspecified: Secondary | ICD-10-CM

## 2023-12-01 DIAGNOSIS — M2550 Pain in unspecified joint: Secondary | ICD-10-CM

## 2023-12-01 DIAGNOSIS — R208 Other disturbances of skin sensation: Secondary | ICD-10-CM

## 2023-12-01 DIAGNOSIS — R269 Unspecified abnormalities of gait and mobility: Secondary | ICD-10-CM | POA: Diagnosis not present

## 2023-12-01 DIAGNOSIS — Z79899 Other long term (current) drug therapy: Secondary | ICD-10-CM | POA: Diagnosis not present

## 2023-12-01 DIAGNOSIS — G35 Multiple sclerosis: Secondary | ICD-10-CM | POA: Diagnosis not present

## 2023-12-01 NOTE — Telephone Encounter (Signed)
 Placed JCV in [facility] 12/01/2023

## 2023-12-01 NOTE — Progress Notes (Signed)
 [facility]  PATIENT: Joan Ray DOB: 1960-12-01  REFERRING DOCTOR OR PCP:  Mort Ards [phone]) Charlotta Cook (PCP) SOURCE: Patient, MRI images on PACS and CD and notes from PCP and ortho  _________________________________   HISTORICAL  CHIEF COMPLAINT:  Chief Complaint  Patient presents with   RM11/MS    Pt is here Alone. Pt states she has been having a lot of arthritis pain in her arm. Pt states that her nerve in her arm is pinched and she can't lift and bend her arm on her right side. Pt denies any falls.     HISTORY OF PRESENT ILLNESS:  Joan Ray is a 63 y.o. right-handed patient with relapsing remitting MS diagnosed in 2010.     Update 12/01/2023 For MS, she is on Tysabri  and tolerates it well.   She had a positive JCV Ab increasing to 0.9 once but 0.3 in 07/2023.   We increased the interval to q6 weeks.  The MS is stable but she feels more fatigue with the longer interval.    The only other DMT she has bene on is Avonex.    Her latest MRI 11/2021 showed no new lesions.    She has had some insurance issues for the infusions and we could consider a different infusion center  She feels the burning  dysesthesias in her feet are worse.  Pain is worse with movements.     She is also on Lyrica  150 mg bid and duloxetine .    Lamotrigine had not helped her neuropathic pain and was not tolerated.(Tongue burning) and oxcarbazepine  had not helped.       She sleeps poorly many nights, worse when she has pain.   Clonazepam  1 mg helps her sleep.  She sometimes take on duirng the day for her spasms.    Tizanidine  had not helped and baclofen did not help much.    Mood is doing ok.  She takes duloxetine  120 mg daily.Aaron Aas  She was having mouth sores and given a swish / swallow.  She was told it might be from Tysabri  but I feel that is not too likely.  She has had candida a few times.     HgbA1c ws 6.0 and she is trying to lose weight   OZempic with phentermine   has helped weiht loss more than Ozempic alone.  Frederik Jansky was not covererd.   She had CTR on the left and had right shoulder replacement March 2022 and left March 2023.   She hreports more recent right elbow pain.  She is experiencing left knee.DJD and is seeing Dr. Bernard Brick ([facility]).    Injections had not helped.  She also had a steroid taper.    She had a left shoulder replacement in February 2024.   A breast cyst was seen and she needed a biopsy showing neoplasm (Stage IA (pT1c, pN0, cM0, G2, ER+, PR+, HER2-) ) .  It was positive so she had lymph nodes removed -- they showed no cancer.   She had Radiation and a pill was recommended x 5 years but she opted not to take due to side effect profile.       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 She also had pain in the left eye. She went to an ophthalmologist who did some tests and referred her or an MRI. The MRI was abnormal, consistent with MS and she was referred to  Dr. Alvah Auerbach.   Usually, she was placed on weekly Avonex injections. She did not like the way that she felt on the injections. Additionally, an MRI of the brain showed that she had 2 new lesions. Therefore, she was switched to Tysabri  in 2011.     On Tysabri , she has had no definite exacerbations, no change in MRI. Additionally she tolerates the medication very well. She has had some low positive JCV antibody titers. We do not have the actual numbers.    She switched from Dr. Alexia Angelucci to Dr. Lovely Rubins in [place] in 2013. She is interested in changing to an MS specialist.    Last MRI was early 2016 at [place] and [address] in [place].     Spine/limb pain history:    She reports having  L5-S1 fusion in October 2014 and C5-C7 fusion June 2016.   Although some symptoms improved after surgery, she continues to have a lot of difficulties with neck pain, arm pain, back pain and leg pain.    She was placed on Lyrica  for her pain and was titrated to  200 mg twice a day but could not tolerate that dose. She has since been reduced to 100 mg by mouth twice a day and tolerates that dose but continues to have pain.      She reports some axial neck and lower back pain but the dysesthetic pain in the hands and feet is more troublesome.    Both hands have the dysesthetic pain but in the legs, the right foot is much worse than the left.   She gets some benefit from Lyrica .   She felt her pain had been better on Cymbalta  than on Effexor . However, in 2013 when she was having more neurologic issues the Cymbalta  was discontinued.   In the right foot, the pain is most intense on the top of the foot and towards the ankle but not above the ankle.    I personally reviewed the MRI of the cervical spine from 10/07/2014.   It shows degenerative changes at C5-C6 resulting in left foraminal narrowing and changes at C6-C7 resulting right foraminal narrowing. The spinal cord appears normal. Plain films done after surgery showed a C5-C7 fusion and lumbar plain films showed the L5-S1 fusion.  STUDIES: Nerve conduction and EMG study 07/2019 showed bilateral carpal tunnel syndrome, worse on the left and a mild left chronic C7 radiculopathy without any active features.  We discussed referring her to a hand surgeon as she has noted increasing pain over the past couple months.  Additionally, the carpal tunnel syndrome on the left has worsened compared to the 2018 NCV/EMG study  IMAGING MRI brain 11/26/2022 showed  Scattered T2/FLAIR hyperintense foci in the cerebral hemispheres in a pattern consistent with chronic demyelinating plaque associated with multiple sclerosis.  None of the foci appear to be acute.  Compared to the MRI from 2019, there do not appear to be any new lesions.     Pituitary tissue is thinned within a normal sized sella turcica.  This is usually an incidental finding and appears stable compared to the 2020 MRI.     MRI cervical spine 11/26/2022 was unchanged compared  to 2020.    MRI Cervical spine 05/13/2019 showed  That the spinal cord appears normal.   ACDF at C5-C7.  This has occurred since the 07/02/2010 MRI. .   Mild degenerative changes at C2-C3, C3-C4 and C4-C5, T1-T2 and T2-T3 that do not lead to significant foraminal narrowing  or spinal stenosis.  At C7-T1, there is moderate right foraminal narrowing but no definite nerve root compression.   Degenerative changes at C7-T1 and T1-T2 have progressed compared to the 2011 MRI.  MRI Brain 04/13/2018 showed multiple T2/FLAIR hyperintense foci in the hemispheres in a pattern and configuration consistent with chronic demyelinating plaque associated with multiple sclerosis.  None of the foci appears to be acute and there do not appear to be any new lesions compared to 09/24/2017 MRI. There is increased adenoidal tissue in the posterior nasopharynx.  This appears unchanged compared to the previous MRI.  There is a normal enhancement pattern and there are no acute findings.   REVIEW OF SYSTEMS: Constitutional: No fevers, chills, sweats, or change in appetite.    She has insomnia and headaches and restless leg syndrome she reports a lot of fatigue. Eyes: No visual changes, double vision, eye pain Ear, nose and throat: No hearing loss, ear pain, nasal congestion, sore throat Cardiovascular: No chest pain, palpitations Respiratory:  No shortness of breath at rest or with exertion.   No wheezes GastrointestinaI:  She has constipation.   No nausea, vomiting, diarrhea, abdominal pain, fecal incontinence Genitourinary:  No dysuria, urinary retention or frequency.  No nocturia. Musculoskeletal:  as above Integumentary: No rash, skin lesions.   Some prurutis Neurological: as above Psychiatric: Notes depression > anxiety Endocrine: No palpitations, diaphoresis, change in appetite, change in weigh or increased thirst Hematologic/Lymphatic:  No anemia, purpura, petechiae. Allergic/Immunologic: No itchy/runny eyes, nasal  congestion, recent allergic reactions, rashes  ALLERGIES: Allergies  Allergen Reactions   Bee Venom Swelling   Hydrocodone-Acetaminophen  Itching   Benzoyl Peroxide Rash   Progesterone Other (See Comments) and Rash    increase/profuse sweating-flushing-   Sertraline Hcl Other (See Comments)   Statins Swelling    muscle and joint pain/tightness   Trazodone Other (See Comments)    dizziness   Betadine [Povidone Iodine]     Blisters   Codeine Itching    Rash   Darvocet [Propoxyphene N-Acetaminophen ] Nausea And Vomiting   Diazepam     Increases agitation   Dilaudid  [Hydromorphone  Hcl] Itching   Doxycycline     Facial flushing   Keppra [Levetiracetam]     Shaking, nervousness, palpations and flushing   Lamictal [Lamotrigine]     Causes tongue and mouth to tingle   Lipitor [Atorvastatin]     Severe muscle and joint pain / ms relapse   Lyrica  [Pregabalin ] Other (See Comments)    Doses over 300mg  decrease memory   Neurontin [Gabapentin]     Impaired cognition, slower motor skills (intolerant)   Other     EDTA causes blisters   Oxycodone  Hcl Itching   Pravastatin Other (See Comments)    Severe joint pain MS relapse   Sertraline Other (See Comments)    Increased nervousness   Topamax [Topiramate]     Confusion   Tramadol  Other (See Comments)    "Nervous, jittery, felt weird, nausea"   Zonisamide     Back pain, headaches, insomnia, chest pressure   Cephalosporins Rash   Erythromycin Itching and Rash   Keflex [Cephalexin] Rash   Vancomycin  Rash    Pt stated, " I can't tolerate Vancomycin  I get a red rash."    HOME MEDICATIONS:  Current Outpatient Medications:    acetaminophen  (TYLENOL ) 650 MG CR tablet, Take 1,300 mg by mouth every 8 (eight) hours as needed for pain., Disp: , Rfl:    CELEBREX  200 MG capsule, TAKE ONE CAPSULE  BY MOUTH TWICE A DAY, Disp: 60 capsule, Rfl: 5   cetirizine (ZYRTEC) 10 MG tablet, Take 10 mg by mouth daily., Disp: , Rfl:    Cholecalciferol  (VITAMIN D3) 125 MCG (5000 UT) CAPS, Take 5,000 Units by mouth daily. , Disp: , Rfl:    clonazePAM  (KLONOPIN ) 0.5 MG tablet, TAKE 1 TABLET BY MOUTH EVERY MORNING AND 1 TABLET BY MOUTH EVERY EVENING. TAKE 2 TABLETS BY MOUTH AT BEDTIME., Disp: 120 tablet, Rfl: 5   DULoxetine  (CYMBALTA ) 60 MG capsule, TAKE 2 CAPSULES BY MOUTH DAILY, Disp: 180 capsule, Rfl: 2   EPINEPHrine  0.3 mg/0.3 mL IJ SOAJ injection, Inject 0.3 mg into the muscle as needed for anaphylaxis., Disp: , Rfl:    esomeprazole  (NEXIUM ) 20 MG capsule, Take 20 mg by mouth daily with supper., Disp: , Rfl:    fluticasone (FLONASE) 50 MCG/ACT nasal spray, Place 2 sprays into both nostrils at bedtime. , Disp: , Rfl:    hydrochlorothiazide  (HYDRODIURIL ) 25 MG tablet, Take 25 mg by mouth daily., Disp: , Rfl:    HYDROmorphone  (DILAUDID ) 2 MG tablet, Take 1 tablet (2 mg total) by mouth every 4 (four) hours as needed., Disp: 30 tablet, Rfl: 0   Melatonin 10 MG CAPS, Take 10 mg by mouth at bedtime. , Disp: , Rfl:    natalizumab  (TYSABRI ) 300 MG/15ML injection, Inject 300 mg into the vein every 6 (six) weeks. Takes for MS, Disp: , Rfl:    Olopatadine HCl 0.6 % SOLN, Place 2 sprays into the nose in the morning., Disp: , Rfl:    phentermine  37.5 MG capsule, Take 37.5 mg by mouth every morning., Disp: , Rfl:    pregabalin  (LYRICA ) 150 MG capsule, TAKE 1 CAPSULE BY MOUTH 2 TIMES A DAY, Disp: 180 capsule, Rfl: 1   rizatriptan (MAXALT) 10 MG tablet, Take 10 mg by mouth every 2 (two) hours as needed for migraine. May repeat in 2 hours if needed, Disp: , Rfl:    Semaglutide,0.25 or 0.5MG /DOS, (OZEMPIC, 0.25 OR 0.5 MG/DOSE,) 2 MG/1.5ML SOPN, Inject into the skin., Disp: , Rfl:    senna-docusate (SENOKOT-S) 8.6-50 MG tablet, Take 2 tablets by mouth 2 (two) times daily., Disp: , Rfl:    verapamil  (CALAN -SR) 180 MG CR tablet, TAKE 1 TABLET BY MOUTH EVERY NIGHT AT BEDTIME, Disp: 90 tablet, Rfl: 1   amoxicillin -clavulanate (AUGMENTIN ) 875-125 MG tablet, Take 1  tablet by mouth 2 (two) times daily. Pt tolerates augmentin  (Patient not taking: Reported on 12/01/2023), Disp: 60 tablet, Rfl: 2 No current facility-administered medications for this visit.  Facility-Administered Medications Ordered in Other Visits:    gadopentetate dimeglumine  (MAGNEVIST ) injection 20 mL, 20 mL, Intravenous, Once PRN, Genifer Lazenby, Sherida Dimmer, MD  PAST MEDICAL HISTORY: Past Medical History:  Diagnosis Date   Anemia    h/o of   Last infusion of iron  2008   Anxiety    Arthritis    osteo    Complication of anesthesia    Constipation    Depression    d/t decreased activity   GERD (gastroesophageal reflux disease)    Headache(784.0)    migraines   High triglycerides    Hypertension    MS (multiple sclerosis) (HCC)    Neuromuscular disorder (HCC)    mutliple sclerosis--dx 10/2008   PONV (postoperative nausea and vomiting)    no problems when Scoplamine is used   Pre-diabetes    patient has denied   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   Restless leg syndrome  Sleep apnea    mild sleep apnea, no CPAP   Wears glasses    Wears glasses     PAST SURGICAL HISTORY: Past Surgical History:  Procedure Laterality Date   ANTERIOR CERVICAL DECOMP/DISCECTOMY FUSION N/A 12/21/2014   Procedure: ANTERIOR CERVICAL DECOMPRESSION/DISCECTOMY FUSION 2 LEVELS C5-7;  Surgeon: Mort Ards, MD;  Location: [facility];  Service: Orthopedics;  Laterality: N/A;   BLADDER SUSPENSION     occas  blood in urine   BREAST BIOPSY  2024   CARPAL TUNNEL RELEASE Bilateral    ENDOMETRIAL ABLATION  2010   FOOT ARTHRODESIS Right 07/28/2013   Procedure: ARTHRODESIS RIGHT MIDFOOT OF THE FIRST -  THIRD TARSOMETATARSAL JOINTS ;  Surgeon: Amada Backer, MD;  Location: [facility];  Service: Orthopedics;  Laterality: Right;   GASTROCNEMIUS RECESSION Right 07/28/2013   Procedure: RIGHT GASTROCNEMIUS RECESSION;  Surgeon: Amada Backer, MD;  Location: [facility];  Service:  Orthopedics;  Laterality: Right;   JOINT REPLACEMENT  2012   lt total knee   KNEE ARTHROSCOPY     bilateral  in 2010, has multiple scopes   MEDIAL PARTIAL KNEE REPLACEMENT     right   nerve blocks     PICC LINE INSERTION  2024   SHOULDER ARTHROSCOPY Right    SPINAL FUSION  05/05/2013   stress fracture     right 2-3 toe   TENDON RELEASE Right    TOTAL KNEE ARTHROPLASTY  2008   partial rt    TOTAL SHOULDER ARTHROPLASTY Left 03/24/2019   Procedure: TOTAL SHOULDER ARTHROPLASTY;  Surgeon: Ellard Gunning, MD;  Location: [facility];  Service: Orthopedics;  Laterality: Left;     TOTAL SHOULDER ARTHROPLASTY Right 09/13/2020   Procedure: TOTAL SHOULDER ARTHROPLASTY;  Surgeon: Ellard Gunning, MD;  Location: [facility];  Service: Orthopedics;  Laterality: Right;    TOTAL SHOULDER REVISION Left 08/07/2022   Procedure: Removal of Left anatomic arthroplasty and conversion to reverse arthroplasty;  Surgeon: Ellard Gunning, MD;  Location: [facility];  Service: Orthopedics;  Laterality: Left;     FAMILY HISTORY: Family History  Problem Relation Age of Onset   Heart disease Mother    Cancer Mother    Hypertension Mother    Depression Mother    Heart disease Other    Hypertension Maternal Grandmother    Diabetes Maternal Grandmother    Kidney disease Maternal Grandmother    Rheum arthritis Maternal Grandmother    Depression Maternal Grandmother    Hypertension Paternal Grandmother    Kidney disease Maternal Grandfather    Depression Maternal Grandfather    Depression Brother     SOCIAL HISTORY:  Social History   Socioeconomic History   Marital status: Married    Spouse name: Not on file   Number of children: 3   Years of education: Not on file   Highest education level: Not on file  Occupational History    Comment: RN  Tobacco Use   Smoking status: Never   Smokeless tobacco: Never  Vaping Use   Vaping status: Never Used  Substance and Sexual Activity   Alcohol use: Yes     Comment: socially--wine & beer   Drug use: No   Sexual activity: Not on file  Other Topics Concern   Not on file  Social History Narrative   Lives at home with   Caffeine use:    Social Drivers of Health   Financial Resource Strain: Low Risk  (02/14/2023)   Received  from [facility]   Overall Financial Resource Strain (CARDIA)    Difficulty of Paying Living Expenses: Not hard at all  Food Insecurity: No Food Insecurity (02/14/2023)   Received from [facility]   Hunger Vital Sign    Worried About Running Out of Food in the Last Year: Never true    Ran Out of Food in the Last Year: Never true  Transportation Needs: No Transportation Needs (02/14/2023)   Received from [facility] - Transportation    Lack of Transportation (Medical): No    Lack of Transportation (Non-Medical): No  Physical Activity: Unknown (02/14/2023)   Received from [facility]   Exercise Vital Sign    Days of Exercise per Week: 0 days    Minutes of Exercise per Session: Not on file  Stress: No Stress Concern Present (02/14/2023)   Received from [facility] - Occupational Stress Questionnaire    Feeling of Stress : Only a little  Social Connections: Socially Integrated (02/14/2023)   Received from [facility]   Social Network    How would you rate your social network (family, work, friends)?: Good participation with social networks  Intimate Partner Violence: Not At Risk (02/14/2023)   Received from [facility]   HITS    Over the last 12 months how often did your partner physically hurt you?: Never    Over the last 12 months how often did your partner insult you or talk down to you?: Never    Over the last 12 months how often did your partner threaten you with physical harm?: Never    Over the last 12 months how often did your partner scream or curse at you?: Never        DIAGNOSTIC DATA (LABS, IMAGING, TESTING) - I reviewed patient  records, labs, notes, testing and imaging myself where available.  Lab Results  Component Value Date   WBC 4.2 07/23/2023   HGB 12.9 07/23/2023   HCT 38.4 07/23/2023   MCV 88.9 07/23/2023   PLT 223.0 07/23/2023      Component Value Date/Time   NA 141 10/14/2022 0243   NA 143 10/25/2019 1343   K 4.0 10/14/2022 0243   CL 104 10/14/2022 0243   CO2 30 10/14/2022 0243   GLUCOSE 91 10/14/2022 0243   BUN 22 10/14/2022 0243   BUN 16 10/25/2019 1343   CREATININE 1.06 (H) 10/14/2022 0243   CALCIUM 9.3 10/14/2022 0243   PROT 6.5 10/25/2019 1343   ALBUMIN  4.4 10/25/2019 1343   AST 21 10/25/2019 1343   ALT 30 10/25/2019 1343   ALKPHOS 92 10/25/2019 1343   BILITOT 0.4 10/25/2019 1343   GFRNONAA >60 08/01/2022 1350   GFRAA 108 10/25/2019 1343     PHYSICAL EXAM   General: The patient is well-developed and well-nourished and in no acute distress.  The neck is mildly tender with good ROM.  She has reduced range of motion at shoulders left worse than right.No rashes or edema.    Left knee is tender.  She has surgical scar.    Neurologic Exam   Mental status: The patient is alert and oriented x 3 at the time of the examination.    Motor:  Muscle bulk is normal.   Tone is normal. Strength is  5 / 5 in all 4 extremities except 4+/5 bilateral APB muscles   Sensory: There is a Tinel's sign at both wrists..  She has slight allodynia  over the thenar eminence on the left.  Vibration sensation was reduced in the feet,   Touch was normal.    Coordination: Cerebellar testing reveals good finger-nose-finger and heel-to-shin bilaterally.   Gait and station: Station is normal.   Her gait is arthritic and mildly wide.  Gait is wide The Romberg is negative.   Reflexes: Deep tendon reflexes are symmetric and normal bilaterally.        ASSESSMENT AND PLAN  Multiple sclerosis (HCC) - Plan: CBC with Differential/Platelet, Stratify JCV Antibody Test ([facility])  High risk medication use - Plan: CBC with  Differential/Platelet, Stratify JCV Antibody Test ([facility])  Gait disturbance  Multiple joint pain  Dysesthesia  Insomnia, unspecified type   1.   For her MS, we will continue Tysabri  every 6 weeks.  Check JCV antibody at least every 6 months.  She asked about other options  Due to breast cancer, I would not recommend an anti-CD20 or cladribine.  Would consider teriflunomide (or even discontinue all DMT at some point) 2.  She will continue medications for her dysesthetic pain, myalgias, depression.    3.  Stay active and exercise as tolerated. 4.   Return in 6 months or sooner if there are new or worsening neurologic symptoms.   This visit is part of a comprehensive longitudinal care medical relationship regarding the patients primary diagnosis of multiple sclerosis and related concerns.  Arris Meyn A. Godwin Lat, MD, PhD 12/01/2023, 11:24 AM Certified in Neurology, Clinical Neurophysiology, Sleep Medicine, Pain Medicine and Neuroimaging  [facility] [address], [address] [place], [place] [address] [phone]

## 2023-12-02 LAB — CBC WITH DIFFERENTIAL/PLATELET
Basophils Absolute: 0.1 10*3/uL (ref 0.0–0.2)
Basos: 1 %
EOS (ABSOLUTE): 0.2 10*3/uL (ref 0.0–0.4)
Eos: 5 %
Hematocrit: 40.3 % (ref 34.0–46.6)
Hemoglobin: 13.1 g/dL (ref 11.1–15.9)
Immature Grans (Abs): 0 10*3/uL (ref 0.0–0.1)
Immature Granulocytes: 0 %
Lymphocytes Absolute: 1.9 10*3/uL (ref 0.7–3.1)
Lymphs: 39 %
MCH: 29.2 pg (ref 26.6–33.0)
MCHC: 32.5 g/dL (ref 31.5–35.7)
MCV: 90 fL (ref 79–97)
Monocytes Absolute: 0.3 10*3/uL (ref 0.1–0.9)
Monocytes: 7 %
NRBC: 2 % — ABNORMAL HIGH (ref 0–0)
Neutrophils Absolute: 2.3 10*3/uL (ref 1.4–7.0)
Neutrophils: 48 %
Platelets: 252 10*3/uL (ref 150–450)
RBC: 4.49 x10E6/uL (ref 3.77–5.28)
RDW: 14.4 % (ref 11.7–15.4)
WBC: 4.8 10*3/uL (ref 3.4–10.8)

## 2023-12-07 NOTE — Telephone Encounter (Signed)
 Joan Ray

## 2023-12-17 ENCOUNTER — Encounter: Payer: Self-pay | Admitting: Neurology

## 2024-01-07 ENCOUNTER — Ambulatory Visit (INDEPENDENT_AMBULATORY_CARE_PROVIDER_SITE_OTHER)

## 2024-01-07 VITALS — BP 129/84 | HR 62 | Temp 98.5°F | Resp 18 | Ht 68.0 in | Wt 264.8 lb

## 2024-01-07 DIAGNOSIS — G35 Multiple sclerosis: Secondary | ICD-10-CM

## 2024-01-07 MED ORDER — SODIUM CHLORIDE 0.9 % IV SOLN: INTRAVENOUS | Status: DC

## 2024-01-07 MED ORDER — SODIUM CHLORIDE 0.9 % IV SOLN
300.0000 mg | Freq: Once | INTRAVENOUS | Status: AC
  Administered 2024-01-07: 300 mg via INTRAVENOUS
  Filled 2024-01-07: qty 15

## 2024-01-07 NOTE — Progress Notes (Signed)
 Diagnosis: Multiple Sclerosis  Provider:  Lonna Coder MD  Procedure: IV Infusion  IV Type: Peripheral, IV Location: R Hand  Tysabri  (Natalizumab ), Dose: 300 mg  Infusion Start Time: 1202  Infusion Stop Time: 1308  Post Infusion IV Care: Peripheral IV Discontinued  Discharge: Condition: Good, Destination: Home . AVS Declined  Performed by:  Ananda Sitzer G [place], RN

## 2024-02-11 ENCOUNTER — Other Ambulatory Visit: Payer: Self-pay | Admitting: Neurology

## 2024-02-11 NOTE — Telephone Encounter (Signed)
 Last see on 12/01/23 Follow up scheduled on 06/08/24

## 2024-02-18 ENCOUNTER — Other Ambulatory Visit: Payer: Self-pay

## 2024-02-18 ENCOUNTER — Ambulatory Visit (INDEPENDENT_AMBULATORY_CARE_PROVIDER_SITE_OTHER)

## 2024-02-18 VITALS — BP 123/70 | HR 61 | Temp 98.9°F | Resp 18 | Ht 68.0 in | Wt 262.8 lb

## 2024-02-18 DIAGNOSIS — G35 Multiple sclerosis: Secondary | ICD-10-CM

## 2024-02-18 MED ORDER — SODIUM CHLORIDE 0.9 % IV SOLN
300.0000 mg | Freq: Once | INTRAVENOUS | Status: AC
  Administered 2024-02-18: 300 mg via INTRAVENOUS
  Filled 2024-02-18: qty 15

## 2024-02-18 MED ORDER — SODIUM CHLORIDE 0.9 % IV SOLN: INTRAVENOUS | Status: DC

## 2024-02-18 NOTE — Progress Notes (Signed)
 Diagnosis: Multiple Sclerosis  Provider:  Lonna Coder MD  Procedure: IV Infusion  IV Type: Peripheral, IV Location: R Forearm  Tysabri  (Natalizumab ), Dose: 300 mg  Infusion Start Time: 1134  Infusion Stop Time: 1240  Post Infusion IV Care: Patient declined observation and Peripheral IV Discontinued  Discharge: Condition: Good, Destination: Home . AVS Declined  Performed by:  Donny Childes, RN

## 2024-03-03 ENCOUNTER — Ambulatory Visit: Payer: Self-pay | Admitting: Neurology

## 2024-03-03 LAB — CBC WITH DIFFERENTIAL/PLATELET
Absolute Lymphocytes: 1940 {cells}/uL (ref 850–3900)
Absolute Monocytes: 315 {cells}/uL (ref 200–950)
Basophils Absolute: 50 {cells}/uL (ref 0–200)
Basophils Relative: 1 %
Eosinophils Absolute: 305 {cells}/uL (ref 15–500)
Eosinophils Relative: 6.1 %
HCT: 39.7 % (ref 35.0–45.0)
Hemoglobin: 12.7 g/dL (ref 11.7–15.5)
MCH: 28.7 pg (ref 27.0–33.0)
MCHC: 32 g/dL (ref 32.0–36.0)
MCV: 89.6 fL (ref 80.0–100.0)
MPV: 9.3 fL (ref 7.5–12.5)
Monocytes Relative: 6.3 %
Neutro Abs: 2390 {cells}/uL (ref 1500–7800)
Neutrophils Relative %: 47.8 %
Platelets: 233 Thousand/uL (ref 140–400)
RBC: 4.43 Million/uL (ref 3.80–5.10)
RDW: 14.7 % (ref 11.0–15.0)
Total Lymphocyte: 38.8 %
WBC: 5 Thousand/uL (ref 3.8–10.8)

## 2024-03-03 LAB — STRATIFY JCV AB (W/ INDEX) W/ RFLX
Index Value: 0.27 {index} — ABNORMAL HIGH
Stratify JCV (TM) Ab w/Reflex Inhibition: UNDETERMINED — AB

## 2024-03-03 LAB — RFLX STRATIFY JCV (TM) AB INHIBITION: JCV Antibody by Inhibition: NEGATIVE

## 2024-03-15 ENCOUNTER — Telehealth: Payer: Self-pay | Admitting: Neurology

## 2024-03-15 NOTE — Telephone Encounter (Signed)
 Patient calling with current phone number and email address to be able to get in MyChart.

## 2024-03-31 ENCOUNTER — Ambulatory Visit (INDEPENDENT_AMBULATORY_CARE_PROVIDER_SITE_OTHER)

## 2024-03-31 VITALS — BP 119/77 | HR 61 | Temp 97.7°F | Resp 18 | Ht 68.0 in | Wt 260.0 lb

## 2024-03-31 DIAGNOSIS — G35 Multiple sclerosis: Secondary | ICD-10-CM | POA: Diagnosis not present

## 2024-03-31 MED ORDER — SODIUM CHLORIDE 0.9 % IV SOLN
300.0000 mg | Freq: Once | INTRAVENOUS | Status: AC
  Administered 2024-03-31: 300 mg via INTRAVENOUS
  Filled 2024-03-31: qty 15

## 2024-03-31 MED ORDER — SODIUM CHLORIDE 0.9 % IV SOLN: INTRAVENOUS | Status: DC

## 2024-03-31 NOTE — Progress Notes (Signed)
 Diagnosis: Multiple Sclerosis  Provider:  Lonna Coder MD  Procedure: IV Infusion  IV Type: Peripheral, IV Location: right wrist   Tysabri  (Natalizumab ), Dose: 300 mg  Infusion Start Time: 1118  Infusion Stop Time: 1229  Post Infusion IV Care: Patient declined observation and Peripheral IV Discontinued  Discharge: Condition: Good, Destination: Home . AVS Declined  Performed by:  Leita FORBES Miles, LPN

## 2024-04-14 ENCOUNTER — Other Ambulatory Visit: Payer: Self-pay | Admitting: *Deleted

## 2024-04-14 MED ORDER — PREGABALIN 150 MG PO CAPS: 150.0000 mg | ORAL_CAPSULE | Freq: Two times a day (BID) | ORAL | 1 refills | Status: AC

## 2024-04-14 NOTE — Telephone Encounter (Signed)
 Last seen on 12/01/23 Follow up scheduled 06/08/24   Dispensed Days Supply Quantity Provider Pharmacy  PREGABALIN  150 MG CAPS 01/06/2024 90 180 applicator Buck Saucer, MD [facility]...     Rx pending to be signed

## 2024-05-07 ENCOUNTER — Encounter: Payer: Self-pay | Admitting: Neurology

## 2024-05-09 ENCOUNTER — Other Ambulatory Visit: Payer: Self-pay | Admitting: Neurology

## 2024-05-09 ENCOUNTER — Encounter: Payer: Self-pay | Admitting: Radiology

## 2024-05-09 DIAGNOSIS — R269 Unspecified abnormalities of gait and mobility: Secondary | ICD-10-CM

## 2024-05-09 DIAGNOSIS — G35D Multiple sclerosis, unspecified: Secondary | ICD-10-CM

## 2024-05-09 MED ORDER — DULOXETINE HCL 60 MG PO CPEP: 120.0000 mg | ORAL_CAPSULE | Freq: Every day | ORAL | 1 refills | Status: AC

## 2024-05-09 MED ORDER — VERAPAMIL HCL ER 180 MG PO TBCR: 180.0000 mg | EXTENDED_RELEASE_TABLET | Freq: Every day | ORAL | 1 refills | Status: AC

## 2024-05-11 ENCOUNTER — Telehealth: Payer: Self-pay | Admitting: Neurology

## 2024-05-11 NOTE — Telephone Encounter (Signed)
MRI orders sent to [facility] [phone]

## 2024-05-12 ENCOUNTER — Ambulatory Visit

## 2024-05-12 VITALS — BP 104/69 | HR 62 | Temp 98.0°F | Resp 16 | Ht 68.0 in | Wt 269.2 lb

## 2024-05-12 DIAGNOSIS — G35D Multiple sclerosis, unspecified: Secondary | ICD-10-CM

## 2024-05-12 MED ORDER — SODIUM CHLORIDE 0.9 % IV SOLN
300.0000 mg | Freq: Once | INTRAVENOUS | Status: AC
  Administered 2024-05-12: 300 mg via INTRAVENOUS
  Filled 2024-05-12: qty 15

## 2024-05-12 NOTE — Progress Notes (Signed)
 Diagnosis: Multiple Sclerosis  Provider:  Mannam, Praveen MD  Procedure: IV Infusion  IV Type: Peripheral, IV Location: R Antecubital  Tysabri  (Natalizumab ), Dose: 300 mg  Infusion Start Time: 1141  Infusion Stop Time: 1242  Post Infusion IV Care: Patient declined observation and Peripheral IV Discontinued  Discharge: Condition: Stable, Destination: Home . AVS Declined  Performed by:  Rocky FORBES Sar, RN

## 2024-05-16 ENCOUNTER — Other Ambulatory Visit: Payer: Self-pay | Admitting: Neurology

## 2024-05-18 NOTE — Telephone Encounter (Signed)
 Last seen on 12/01/23 Follow up scheduled on 06/08/24   Dispensed Days Supply Quantity Provider Pharmacy  CLONAZEPAM  0.5 MG TABS 04/12/2024 30 120 tablet Sater, Charlie LABOR, MD [facility]...     Rx pending to be signed

## 2024-05-24 ENCOUNTER — Encounter: Payer: Self-pay | Admitting: Neurology

## 2024-05-26 ENCOUNTER — Other Ambulatory Visit

## 2024-06-08 ENCOUNTER — Ambulatory Visit: Admitting: Neurology

## 2024-06-08 ENCOUNTER — Encounter: Payer: Self-pay | Admitting: Neurology

## 2024-06-08 VITALS — BP 123/69 | HR 70 | Resp 15 | Ht 68.0 in

## 2024-06-08 DIAGNOSIS — R208 Other disturbances of skin sensation: Secondary | ICD-10-CM

## 2024-06-08 DIAGNOSIS — G35A Relapsing-remitting multiple sclerosis: Secondary | ICD-10-CM | POA: Diagnosis not present

## 2024-06-08 DIAGNOSIS — Z79899 Other long term (current) drug therapy: Secondary | ICD-10-CM

## 2024-06-08 DIAGNOSIS — R269 Unspecified abnormalities of gait and mobility: Secondary | ICD-10-CM

## 2024-06-08 NOTE — Progress Notes (Signed)
 [facility]  PATIENT: Joan Ray DOB: 1961-06-17  REFERRING DOCTOR OR PCP:  Joan Ray [phone]) Joan Ray (PCP) SOURCE: Patient, MRI images on PACS and CD and notes from PCP and ortho  _________________________________   HISTORICAL  CHIEF COMPLAINT:  Chief Complaint  Patient presents with   Multiple Sclerosis    [name], alone, MS FOLLOW UP    HISTORY OF PRESENT ILLNESS:  Joan Ray is a 63 y.o. right-handed patient with relapsing remitting MS diagnosed in 2010.     Update 06/07/2024 For MS, she is on Tysabri  and tolerates it well.   She had a positive JCV Ab increasing to 0.9 once but last 1 was 0.27 (neg) 02/2024.   We increased the interval to q6 weeks.  The MS is stable but she feels more fatigue with the longer interval.    The only other DMT she has bene on is Avonex.    Her latest MRI 11/2021 showed no new lesions.    She has had some insurance issues for the infusions and we could consider a different infusion center.    She has had some episodes where her legs feel like jelly.   One episode was after she had a bad cut on hr leg and lost some blood.   Once she started walking again a few minutes later, she does better again.    We have her scheduled for brain and cervical spine MRI next week  She feels the burning  dysesthesias in her feet are worse.  Pain is worse with movements.     She is also on Lyrica  150 mg bid and duloxetine .    Lamotrigine had not helped her neuropathic pain and was not tolerated.(Tongue burning) and oxcarbazepine  had not helped.       She sleeps poorly many nights, worse when she has pain.   Clonazepam  1 mg helps her sleep.  She sometimes take on duirng the day for her spasms.    Tizanidine  had not helped and baclofen did not help much.    Mood is doing ok.  She takes duloxetine  120 mg daily..  HgbA1c ws 6.0 and she is trying to lose weight   OZempic with phentermine  has helped weiht loss more than Ozempic  alone.  Joan Ray was not covererd.   She had CTR on the left and had right shoulder replacement March 2022 and left March 2023.   She hreports more recent right elbow pain.  She had a left shoulder replacement in February 2024.     A breast cyst was seen and she needed a biopsy showing neoplasm (Stage IA (pT1c, pN0, cM0, G2, ER+, PR+, HER2-) ) .  It was positive so she had lymph nodes removed -- they showed no cancer.   She had Radiation and a pill was recommended x 5 years but she opted not to take due to side effect profile.      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  She has dermatologic prescribed creams now.     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 She also had pain in the left eye. She went to an ophthalmologist who did some tests and referred her or an MRI. The MRI was abnormal, consistent with MS and she was referred to Dr. Norleen Ray.   Usually, she was placed on weekly Avonex injections. She did not  like the way that she felt on the injections. Additionally, an MRI of the brain showed that she had 2 new lesions. Therefore, she was switched to Tysabri  in 2011.     On Tysabri , she has had no definite exacerbations, no change in MRI. Additionally she tolerates the medication very well. She has had some low positive JCV antibody titers. We do not have the actual numbers.    She switched from Dr. Fayette to Dr. Sherrilyn in [place] in 2013. She is interested in changing to an MS specialist.    Last MRI was early 2016 at [facility] [address] in [place].     Spine/limb pain history:    She reports having  L5-S1 fusion in October 2014 and C5-C7 fusion June 2016.   Although some symptoms improved after surgery, she continues to have a lot of difficulties with neck pain, arm pain, back pain and leg pain.    She was placed on Lyrica  for her pain and was titrated to 200 mg twice a day but could not tolerate that  dose. She has since been reduced to 100 mg by mouth twice a day and tolerates that dose but continues to have pain.      She reports some axial neck and lower back pain but the dysesthetic pain in the hands and feet is more troublesome.    Both hands have the dysesthetic pain but in the legs, the right foot is much worse than the left.   She gets some benefit from Lyrica .   She felt her pain had been better on Cymbalta  than on Effexor . However, in 2013 when she was having more neurologic issues the Cymbalta  was discontinued.   In the right foot, the pain is most intense on the top of the foot and towards the ankle but not above the ankle.    I personally reviewed the MRI of the cervical spine from 10/07/2014.   It shows degenerative changes at C5-C6 resulting in left foraminal narrowing and changes at C6-C7 resulting right foraminal narrowing. The spinal cord appears normal. Plain films done after surgery showed a C5-C7 fusion and lumbar plain films showed the L5-S1 fusion.  STUDIES: Nerve conduction and EMG study 07/2019 showed bilateral carpal tunnel syndrome, worse on the left and a mild left chronic C7 radiculopathy without any active features.  We discussed referring her to a hand surgeon as she has noted increasing pain over the past couple months.  Additionally, the carpal tunnel syndrome on the left has worsened compared to the 2018 NCV/EMG study  IMAGING MRI brain 11/26/2022 showed  Scattered T2/FLAIR hyperintense foci in the cerebral hemispheres in a pattern consistent with chronic demyelinating plaque associated with multiple sclerosis.  None of the foci appear to be acute.  Compared to the MRI from 2019, there do not appear to be any new lesions.     Pituitary tissue is thinned within a normal sized sella turcica.  This is usually an incidental finding and appears stable compared to the 2020 MRI.     MRI cervical spine 11/26/2022 was unchanged compared to 2020.    MRI Cervical spine 05/13/2019  showed  That the spinal cord appears normal.   ACDF at C5-C7.  This has occurred since the 07/02/2010 MRI. .   Mild degenerative changes at C2-C3, C3-C4 and C4-C5, T1-T2 and T2-T3 that do not lead to significant foraminal narrowing or spinal stenosis.  At C7-T1, there is moderate right foraminal narrowing but no definite nerve  root compression.   Degenerative changes at C7-T1 and T1-T2 have progressed compared to the 2011 MRI.  MRI Brain 04/13/2018 showed multiple T2/FLAIR hyperintense foci in the hemispheres in a pattern and configuration consistent with chronic demyelinating plaque associated with multiple sclerosis.  None of the foci appears to be acute and there do not appear to be any new lesions compared to 09/24/2017 MRI. There is increased adenoidal tissue in the posterior nasopharynx.  This appears unchanged compared to the previous MRI.  There is a normal enhancement pattern and there are no acute findings.   REVIEW OF SYSTEMS: Constitutional: No fevers, chills, sweats, or change in appetite.    She has insomnia and headaches and restless leg syndrome she reports a lot of fatigue. Eyes: No visual changes, double vision, eye pain Ear, nose and throat: No hearing loss, ear pain, nasal congestion, sore throat Cardiovascular: No chest pain, palpitations Respiratory:  No shortness of breath at rest or with exertion.   No wheezes GastrointestinaI:  She has constipation.   No nausea, vomiting, diarrhea, abdominal pain, fecal incontinence Genitourinary:  No dysuria, urinary retention or frequency.  No nocturia. Musculoskeletal:  as above Integumentary: No rash, skin lesions.   Some prurutis Neurological: as above Psychiatric: Notes depression > anxiety Endocrine: No palpitations, diaphoresis, change in appetite, change in weigh or increased thirst Hematologic/Lymphatic:  No anemia, purpura, petechiae. Allergic/Immunologic: No itchy/runny eyes, nasal congestion, recent allergic reactions,  rashes  ALLERGIES: Allergies  Allergen Reactions   Bee Venom Swelling   Cefazolin  Dermatitis   Cephalexin Rash and Dermatitis   Erythromycin Itching, Rash and Dermatitis   Hydrocodone-Acetaminophen  Itching   Povidone Iodine Dermatitis    Blisters   Progesterone Other (See Comments), Rash and Dermatitis    increase/profuse sweating-flushing-  progesterone   Benzoyl Peroxide Rash   Lamotrigine     Causes tongue and mouth to tingle  Other Reaction(s): Not available, other  lamotrigine   Other Itching    EDTA causes blisters   Sertraline Hcl Other (See Comments)   Statins Swelling    muscle and joint pain/tightness   Topiramate Other (See Comments)    Confusion  Other Reaction(s): confusion, Mental Status Changes, Not available  topiramate   Trazodone Other (See Comments)    dizziness  Other Reaction(s): lightheadedness   Atorvastatin Calcium     Other Reaction(s): Not available  atorvastatin calcium   Codeine Itching    Rash   Darvocet [Propoxyphene N-Acetaminophen ] Nausea And Vomiting   Diazepam     Increases agitation   Dilaudid  [Hydromorphone  Hcl] Itching   Doxycycline     Facial flushing   Gabapentin     Impaired cognition, slower motor skills (intolerant)  Other Reaction(s): Not available  gabapentin   Levetiracetam     Shaking, nervousness, palpations and flushing  Other Reaction(s): Not available  levetiracetam   Lipitor [Atorvastatin]     Severe muscle and joint pain / ms relapse   Oxycodone  Hcl Itching   Oxycodone -Acetaminophen  Itching   Pravastatin Other (See Comments)    Severe joint pain MS relapse  Other Reaction(s): Not available  pravastatin   Sertraline Other (See Comments)    Increased nervousness  Other Reaction(s): Not available  sertraline   Tramadol  Other (See Comments)    Nervous, jittery, felt weird, nausea  Other Reaction(s): other   Zonisamide     Back pain, headaches, insomnia, chest pressure  Other  Reaction(s): Not available  zonisamide   Cephalosporins Rash   Pregabalin  Other (See Comments)  Doses over 300mg  decrease memory  Other Reaction(s): Confusion, Not available  High doses (CURRENTLY TOLERATING WITHOUT ISSUE)  pregabalin    Vancomycin  Rash and Dermatitis    Pt stated,  I can't tolerate Vancomycin  I get a red rash.  vancomycin     HOME MEDICATIONS:  Current Outpatient Medications:    acetaminophen  (TYLENOL ) 650 MG CR tablet, Take 1,300 mg by mouth every 8 (eight) hours as needed for pain., Disp: , Rfl:    CELEBREX  200 MG capsule, TAKE ONE CAPSULE BY MOUTH TWICE A DAY, Disp: 60 capsule, Rfl: 5   cetirizine (ZYRTEC) 10 MG tablet, Take 10 mg by mouth daily., Disp: , Rfl:    Cholecalciferol (VITAMIN D3) 125 MCG (5000 UT) CAPS, Take 5,000 Units by mouth daily. , Disp: , Rfl:    clonazePAM  (KLONOPIN ) 0.5 MG tablet, TAKE 1 TABLET BY MOUTH EVERY MORNING AND EVERY EVENING . TAKE 2 TABLETS BY MOUTH AT BEDTIME., Disp: 120 tablet, Rfl: 5   DULoxetine  (CYMBALTA ) 60 MG capsule, Take 2 capsules (120 mg total) by mouth daily., Disp: 180 capsule, Rfl: 1   EPINEPHrine  0.3 mg/0.3 mL IJ SOAJ injection, Inject 0.3 mg into the muscle as needed for anaphylaxis., Disp: , Rfl:    esomeprazole  (NEXIUM ) 20 MG capsule, Take 20 mg by mouth daily with supper., Disp: , Rfl:    fluticasone (FLONASE) 50 MCG/ACT nasal spray, Place 2 sprays into both nostrils at bedtime. , Disp: , Rfl:    hydrochlorothiazide  (HYDRODIURIL ) 25 MG tablet, Take 25 mg by mouth daily., Disp: , Rfl:    Melatonin 10 MG CAPS, Take 10 mg by mouth at bedtime. , Disp: , Rfl:    natalizumab  (TYSABRI ) 300 MG/15ML injection, Inject 300 mg into the vein every 6 (six) weeks. Takes for MS, Disp: , Rfl:    Olopatadine HCl 0.6 % SOLN, Place 2 sprays into the nose in the morning., Disp: , Rfl:    pregabalin  (LYRICA ) 150 MG capsule, Take 1 capsule (150 mg total) by mouth 2 (two) times daily., Disp: 180 capsule, Rfl: 1   rizatriptan  (MAXALT) 10 MG tablet, Take 10 mg by mouth every 2 (two) hours as needed for migraine. May repeat in 2 hours if needed, Disp: , Rfl:    verapamil  (CALAN -SR) 180 MG CR tablet, Take 1 tablet (180 mg total) by mouth at bedtime., Disp: 90 tablet, Rfl: 1   WEGOVY 0.25 MG/0.5ML SOAJ SQ injection, Inject 0.25 mg into the skin every 7 (seven) days., Disp: , Rfl:  No current facility-administered medications for this visit.  Facility-Administered Medications Ordered in Other Visits:    gadopentetate dimeglumine  (MAGNEVIST ) injection 20 mL, 20 mL, Intravenous, Once PRN, Wayne Brunker, Charlie LABOR, MD  PAST MEDICAL HISTORY: Past Medical History:  Diagnosis Date   Anemia    h/o of   Last infusion of iron  2008   Anxiety    Arthritis    osteo    Complication of anesthesia    Constipation    Depression    d/t decreased activity   GERD (gastroesophageal reflux disease)    Headache(784.0)    migraines   High triglycerides    Hypertension    MS (multiple sclerosis)    Neuromuscular disorder (HCC)    mutliple sclerosis--dx 10/2008   PONV (postoperative nausea and vomiting)    no problems when Scoplamine is used   Pre-diabetes    patient has denied   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   Restless leg syndrome    Sleep apnea  mild sleep apnea, no CPAP   Wears glasses    Wears glasses     PAST SURGICAL HISTORY: Past Surgical History:  Procedure Laterality Date   ANTERIOR CERVICAL DECOMP/DISCECTOMY FUSION N/A 12/21/2014   Procedure: ANTERIOR CERVICAL DECOMPRESSION/DISCECTOMY FUSION 2 LEVELS C5-7;  Surgeon: Joan Sprang, MD;  Location: [facility];  Service: Orthopedics;  Laterality: N/A;   BLADDER SUSPENSION     occas  blood in urine   BREAST BIOPSY  2024   CARPAL TUNNEL RELEASE Bilateral    ENDOMETRIAL ABLATION  2010   FOOT ARTHRODESIS Right 07/28/2013   Procedure: ARTHRODESIS RIGHT MIDFOOT OF THE FIRST -  THIRD TARSOMETATARSAL JOINTS ;  Surgeon: Joan Armor, MD;  Location: [facility];  Service: Orthopedics;  Laterality: Right;   GASTROCNEMIUS RECESSION Right 07/28/2013   Procedure: RIGHT GASTROCNEMIUS RECESSION;  Surgeon: Joan Armor, MD;  Location: [facility];  Service: Orthopedics;  Laterality: Right;   JOINT REPLACEMENT  2012   lt total knee   KNEE ARTHROSCOPY     bilateral  in 2010, has multiple scopes   MEDIAL PARTIAL KNEE REPLACEMENT     right   nerve blocks     PICC LINE INSERTION  2024   SHOULDER ARTHROSCOPY Right    SPINAL FUSION  05/05/2013   stress fracture     right 2-3 toe   TENDON RELEASE Right    TOTAL KNEE ARTHROPLASTY  2008   partial rt    TOTAL SHOULDER ARTHROPLASTY Left 03/24/2019   Procedure: TOTAL SHOULDER ARTHROPLASTY;  Surgeon: Melita Drivers, MD;  Location: [facility];  Service: Orthopedics;  Laterality: Left;     TOTAL SHOULDER ARTHROPLASTY Right 09/13/2020   Procedure: TOTAL SHOULDER ARTHROPLASTY;  Surgeon: Melita Drivers, MD;  Location: [facility];  Service: Orthopedics;  Laterality: Right;    TOTAL SHOULDER REVISION Left 08/07/2022   Procedure: Removal of Left anatomic arthroplasty and conversion to reverse arthroplasty;  Surgeon: Melita Drivers, MD;  Location: [facility];  Service: Orthopedics;  Laterality: Left;     FAMILY HISTORY: Family History  Problem Relation Age of Onset   Heart disease Mother    Cancer Mother    Hypertension Mother    Depression Mother    Heart disease Other    Hypertension Maternal Grandmother    Diabetes Maternal Grandmother    Kidney disease Maternal Grandmother    Rheum arthritis Maternal Grandmother    Depression Maternal Grandmother    Hypertension Paternal Grandmother    Kidney disease Maternal Grandfather    Depression Maternal Grandfather    Depression Brother     SOCIAL HISTORY:  Social History   Socioeconomic History   Marital status: Married    Spouse name: Not on file   Number of children: 3   Years of education: Not on file   Highest education  level: Not on file  Occupational History    Comment: RN  Tobacco Use   Smoking status: Never   Smokeless tobacco: Never  Vaping Use   Vaping status: Never Used  Substance and Sexual Activity   Alcohol use: Yes    Comment: socially--wine & beer   Drug use: No   Sexual activity: Not on file  Other Topics Concern   Not on file  Social History Narrative   Lives at home with   Caffeine use:    Social Drivers of Health   Financial Resource Strain: Low Risk  (02/14/2023)   Received from [facility]  Overall Financial Resource Strain (CARDIA)    Difficulty of Paying Living Expenses: Not hard at all  Food Insecurity: No Food Insecurity (02/14/2023)   Received from [facility]   Hunger Vital Sign    Within the past 12 months, you worried that your food would run out before you got the money to buy more.: Never true    Within the past 12 months, the food you bought just didn't last and you didn't have money to get more.: Never true  Transportation Needs: No Transportation Needs (02/14/2023)   Received from [facility] - Transportation    Lack of Transportation (Medical): No    Lack of Transportation (Non-Medical): No  Physical Activity: Unknown (02/14/2023)   Received from [facility]   Exercise Vital Sign    On average, how many days per week do you engage in moderate to strenuous exercise (like a brisk walk)?: 0 days    Minutes of Exercise per Session: Not on file  Stress: No Stress Concern Present (02/14/2023)   Received from [facility] - Occupational Stress Questionnaire    Feeling of Stress : Only a little  Social Connections: Socially Integrated (02/14/2023)   Received from [facility]   Social Network    How would you rate your social network (family, work, friends)?: Good participation with social networks  Intimate Partner Violence: Not At Risk (05/15/2024)   Received from [facility]   HITS    Over the  last 12 months how often did your partner physically hurt you?: Never    Over the last 12 months how often did your partner insult you or talk down to you?: Never    Over the last 12 months how often did your partner threaten you with physical harm?: Never    Over the last 12 months how often did your partner scream or curse at you?: Never        DIAGNOSTIC DATA (LABS, IMAGING, TESTING) - I reviewed patient records, labs, notes, testing and imaging myself where available.  Lab Results  Component Value Date   WBC 5.0 02/18/2024   HGB 12.7 02/18/2024   HCT 39.7 02/18/2024   MCV 89.6 02/18/2024   PLT 233 02/18/2024      Component Value Date/Time   NA 141 10/14/2022 0243   NA 143 10/25/2019 1343   K 4.0 10/14/2022 0243   CL 104 10/14/2022 0243   CO2 30 10/14/2022 0243   GLUCOSE 91 10/14/2022 0243   BUN 22 10/14/2022 0243   BUN 16 10/25/2019 1343   CREATININE 1.06 (H) 10/14/2022 0243   CALCIUM 9.3 10/14/2022 0243   PROT 6.5 10/25/2019 1343   ALBUMIN  4.4 10/25/2019 1343   AST 21 10/25/2019 1343   ALT 30 10/25/2019 1343   ALKPHOS 92 10/25/2019 1343   BILITOT 0.4 10/25/2019 1343   GFRNONAA >60 08/01/2022 1350   GFRAA 108 10/25/2019 1343     PHYSICAL EXAM   General: The patient is well-developed and well-nourished and in no acute distress.  The neck is mildly tender with good ROM.  She has reduced range of motion at shoulders left worse than right.No rashes or edema.    Left knee is tender.    Neurologic Exam   Mental status: The patient is alert and oriented x 3 at the time of the examination.    Motor:  Muscle bulk is normal.   Tone is normal. Strength is  5 /  5 in all 4 extremities except 4+/5 bilateral APB muscles   Sensory: There is a Tinel's sign at both wrists.. Vibration sensation was reduced in the feet,   Touch was normal.    Coordination: Cerebellar testing reveals good finger-nose-finger and heel-to-shin bilaterally.   Gait and station: Station is normal.    Her gait is arthritic and mildly wide.  Gait is wide the Romberg sign is negative   Reflexes: Deep tendon reflexes are symmetric and normal bilaterally.        ASSESSMENT AND PLAN  Relapsing remitting multiple sclerosis  Gait disturbance  High risk medication use  Dysesthesia   1.   For her MS, we will continue Tysabri  every 6 weeks.  Check JCV antibody at least every 6 months.  For now she wishes to stay on Tysabri  but is also interested in other alternatives.  Due to breast cancer, I would not recommend an anti-CD20 or cladribine.  Would consider teriflunomide (or even discontinue all DMT at some point).   We discussed switching if continued copay issues or for convenience.    2.  She will continue pregabalin , duloxetine   for her dysesthetic pain, myalgias, depression.    3.  Stay active and exercise as tolerated. 4.   Return in 6 months or sooner if there are new or worsening neurologic symptoms.   This visit is part of a comprehensive longitudinal care medical relationship regarding the patients primary diagnosis of multiple sclerosis and related concerns.  Leala Bryand A. Vear, MD, PhD 06/08/2024, 12:00 PM Certified in Neurology, Clinical Neurophysiology, Sleep Medicine, Pain Medicine and Neuroimaging  [facility] [address] [place], [place] [address] [phone]

## 2024-06-13 ENCOUNTER — Ambulatory Visit

## 2024-06-13 ENCOUNTER — Ambulatory Visit
Admission: RE | Admit: 2024-06-13 | Discharge: 2024-06-13 | Disposition: A | Source: Ambulatory Visit | Attending: Neurology | Admitting: Neurology

## 2024-06-13 DIAGNOSIS — G35D Multiple sclerosis, unspecified: Secondary | ICD-10-CM

## 2024-06-13 DIAGNOSIS — R269 Unspecified abnormalities of gait and mobility: Secondary | ICD-10-CM | POA: Diagnosis not present

## 2024-06-13 MED ORDER — GADOPICLENOL 0.5 MMOL/ML IV SOLN
10.0000 mL | Freq: Once | INTRAVENOUS | Status: AC | PRN
  Administered 2024-06-13: 10 mL via INTRAVENOUS

## 2024-06-14 ENCOUNTER — Ambulatory Visit: Payer: Self-pay | Admitting: Neurology

## 2024-06-23 ENCOUNTER — Ambulatory Visit

## 2024-06-23 ENCOUNTER — Other Ambulatory Visit: Payer: Self-pay | Admitting: Neurology

## 2024-06-23 VITALS — BP 121/75 | HR 61 | Temp 98.0°F | Resp 16 | Ht 68.0 in | Wt 268.0 lb

## 2024-06-23 DIAGNOSIS — G35D Multiple sclerosis, unspecified: Secondary | ICD-10-CM

## 2024-06-23 MED ORDER — SODIUM CHLORIDE 0.9 % IV SOLN
300.0000 mg | Freq: Once | INTRAVENOUS | Status: AC
  Administered 2024-06-23: 12:00:00 300 mg via INTRAVENOUS
  Filled 2024-06-23: qty 15

## 2024-06-23 NOTE — Progress Notes (Cosign Needed)
 Diagnosis:  Multiple Sclerosis  Provider:  Lonna Coder MD  Procedure: IV Infusion  IV Type: Peripheral, IV Location: R Wrist   Tysabri  (Natalizumab ), Dose: 300 mg  Infusion Start Time: 1142  Infusion Stop Time: 1240  Post Infusion IV Care: Patient declined observation  Discharge: Condition: Good, Destination: Home . AVS Declined  Performed by:  Eleanor DELENA Bloch, RN

## 2024-06-24 ENCOUNTER — Telehealth: Payer: Self-pay | Admitting: Pharmacy Technician

## 2024-06-24 NOTE — Telephone Encounter (Signed)
 error

## 2024-06-27 ENCOUNTER — Telehealth: Payer: Self-pay | Admitting: Pharmacy Technician

## 2024-06-27 ENCOUNTER — Encounter: Payer: Self-pay | Admitting: Neurology

## 2024-06-27 NOTE — Telephone Encounter (Signed)
 Auth Submission: APPROVED Site of care: Site of care: [facility] Payer: [employer] Medication & CPT/J Code(s) submitted: Tysabri  (Natalizumab ) G7676 Diagnosis Code: G35.D Route of submission (phone, fax, portal): LATENT Phone # Fax # Auth type: Buy/Bill PB Units/visits requested: 300MG  Q6 WEEKS Reference number:  Approval from: 06/27/24 to 07/06/25    Approved indefinitely or until dose or insurance changes Approval letter has been scanned to media tab

## 2024-07-05 ENCOUNTER — Other Ambulatory Visit

## 2024-07-15 ENCOUNTER — Ambulatory Visit
Admission: RE | Admit: 2024-07-15 | Discharge: 2024-07-15 | Disposition: A | Source: Ambulatory Visit | Attending: Neurology | Admitting: Neurology

## 2024-07-15 DIAGNOSIS — G35D Multiple sclerosis, unspecified: Secondary | ICD-10-CM

## 2024-07-15 DIAGNOSIS — R269 Unspecified abnormalities of gait and mobility: Secondary | ICD-10-CM | POA: Diagnosis not present

## 2024-07-15 MED ORDER — GADOPICLENOL 0.5 MMOL/ML IV SOLN
10.0000 mL | Freq: Once | INTRAVENOUS | Status: AC | PRN
  Administered 2024-07-15: 10 mL via INTRAVENOUS

## 2024-07-19 ENCOUNTER — Encounter: Payer: Self-pay | Admitting: Neurology

## 2024-07-19 NOTE — Telephone Encounter (Signed)
 Pt has new Insurance. It is attached but I am also going to have Rulo enter it in demographics. Can you guys work on new prior auth, Please?

## 2024-08-04 ENCOUNTER — Telehealth: Payer: Self-pay | Admitting: Pharmacy Technician

## 2024-08-04 ENCOUNTER — Ambulatory Visit

## 2024-08-04 ENCOUNTER — Telehealth: Payer: Self-pay | Admitting: *Deleted

## 2024-08-04 VITALS — BP 118/75 | HR 98 | Temp 98.4°F | Resp 16 | Ht 68.0 in | Wt 270.4 lb

## 2024-08-04 DIAGNOSIS — G35D Multiple sclerosis, unspecified: Secondary | ICD-10-CM

## 2024-08-04 MED ORDER — SODIUM CHLORIDE 0.9 % IV SOLN: INTRAVENOUS | Status: DC

## 2024-08-04 MED ORDER — SODIUM CHLORIDE 0.9 % IV SOLN
300.0000 mg | Freq: Once | INTRAVENOUS | Status: AC
  Administered 2024-08-04: 300 mg via INTRAVENOUS
  Filled 2024-08-04: qty 15

## 2024-08-04 NOTE — Telephone Encounter (Signed)
 Form in progress. Called pt. Asked about any meds or products in last 6 months that could affect immune system. She states she took a 5 day course of low dose prednisone in October 2025. She also wanted to mention that she uses Tolak 4% cream prn on legs and uses Klisyri 1%  (used on arms, will soon use on face). I added the creams to her med list.   Form completed, signed by Dr Vear, and faxed back to [facility]. Received a receipt of confirmation.

## 2024-08-04 NOTE — Progress Notes (Signed)
 Diagnosis: , Multiple Sclerosis  Provider:  Lonna Coder MD  Procedure: IV Infusion  IV Type: Peripheral, IV Location: Right wrist  , Tysabri  (Natalizumab ), Dose: 300 mg  Infusion Start Time: 1128  Infusion Stop Time: 1231  Post Infusion IV Care: Patient declined observation and Peripheral IV Discontinued  Discharge: Condition: Good, Destination: Home . AVS Declined  Performed by:  Donny Childes, RN

## 2024-08-04 NOTE — Telephone Encounter (Signed)
 TYSABRI  HAS BEEN APPROVED.

## 2024-09-01 ENCOUNTER — Ambulatory Visit

## 2025-01-23 ENCOUNTER — Ambulatory Visit: Admitting: Neurology
